# Patient Record
Sex: Female | Born: 1990 | Race: White | Hispanic: No | Marital: Married | Smoking: Former smoker
Health system: Southern US, Community
[De-identification: ages and names within clinical notes are randomized; demographics above are authoritative.]

## PROBLEM LIST (undated history)

## (undated) ENCOUNTER — Inpatient Hospital Stay (HOSPITAL_COMMUNITY): Payer: Self-pay

## (undated) DIAGNOSIS — F32A Depression, unspecified: Secondary | ICD-10-CM

## (undated) DIAGNOSIS — G971 Other reaction to spinal and lumbar puncture: Secondary | ICD-10-CM

## (undated) DIAGNOSIS — Z950 Presence of cardiac pacemaker: Secondary | ICD-10-CM

## (undated) DIAGNOSIS — I471 Supraventricular tachycardia: Secondary | ICD-10-CM

## (undated) DIAGNOSIS — R531 Weakness: Secondary | ICD-10-CM

## (undated) DIAGNOSIS — J189 Pneumonia, unspecified organism: Secondary | ICD-10-CM

## (undated) DIAGNOSIS — I442 Atrioventricular block, complete: Secondary | ICD-10-CM

## (undated) DIAGNOSIS — T4145XA Adverse effect of unspecified anesthetic, initial encounter: Secondary | ICD-10-CM

## (undated) DIAGNOSIS — R001 Bradycardia, unspecified: Secondary | ICD-10-CM

## (undated) DIAGNOSIS — Z8489 Family history of other specified conditions: Secondary | ICD-10-CM

## (undated) DIAGNOSIS — R0600 Dyspnea, unspecified: Secondary | ICD-10-CM

## (undated) DIAGNOSIS — I4719 Other supraventricular tachycardia: Secondary | ICD-10-CM

## (undated) DIAGNOSIS — T8859XA Other complications of anesthesia, initial encounter: Secondary | ICD-10-CM

## (undated) DIAGNOSIS — F329 Major depressive disorder, single episode, unspecified: Secondary | ICD-10-CM

## (undated) DIAGNOSIS — F319 Bipolar disorder, unspecified: Secondary | ICD-10-CM

## (undated) DIAGNOSIS — R350 Frequency of micturition: Secondary | ICD-10-CM

## (undated) DIAGNOSIS — R351 Nocturia: Secondary | ICD-10-CM

## (undated) DIAGNOSIS — R519 Headache, unspecified: Secondary | ICD-10-CM

## (undated) DIAGNOSIS — A1801 Tuberculosis of spine: Secondary | ICD-10-CM

## (undated) DIAGNOSIS — R51 Headache: Secondary | ICD-10-CM

## (undated) HISTORY — DX: Bradycardia, unspecified: R00.1

## (undated) HISTORY — PX: PACEMAKER PLACEMENT: SHX43

## (undated) HISTORY — PX: TONSILLECTOMY: SUR1361

## (undated) HISTORY — PX: KNEE ARTHROSCOPY: SUR90

## (undated) HISTORY — DX: Bipolar disorder, unspecified: F31.9

## (undated) HISTORY — DX: Presence of cardiac pacemaker: Z95.0

---

## 2014-03-25 ENCOUNTER — Emergency Department (HOSPITAL_BASED_OUTPATIENT_CLINIC_OR_DEPARTMENT_OTHER)
Admission: EM | Admit: 2014-03-25 | Discharge: 2014-03-25 | Disposition: A | Discharge status: Home / Self Care | Payer: Medicaid Other | Attending: Emergency Medicine | Admitting: Emergency Medicine

## 2014-03-25 ENCOUNTER — Emergency Department (HOSPITAL_BASED_OUTPATIENT_CLINIC_OR_DEPARTMENT_OTHER): Payer: Medicaid Other

## 2014-03-25 ENCOUNTER — Encounter (HOSPITAL_BASED_OUTPATIENT_CLINIC_OR_DEPARTMENT_OTHER): Payer: Self-pay | Admitting: Emergency Medicine

## 2014-03-25 DIAGNOSIS — R011 Cardiac murmur, unspecified: Secondary | ICD-10-CM | POA: Insufficient documentation

## 2014-03-25 DIAGNOSIS — O209 Hemorrhage in early pregnancy, unspecified: Secondary | ICD-10-CM | POA: Insufficient documentation

## 2014-03-25 LAB — URINALYSIS, ROUTINE W REFLEX MICROSCOPIC
Bilirubin Urine: NEGATIVE
Glucose, UA: NEGATIVE mg/dL
KETONES UR: NEGATIVE mg/dL
LEUKOCYTES UA: NEGATIVE
NITRITE: NEGATIVE
PROTEIN: NEGATIVE mg/dL
Specific Gravity, Urine: 1.004 — ABNORMAL LOW (ref 1.005–1.030)
Urobilinogen, UA: 0.2 mg/dL (ref 0.0–1.0)
pH: 6.5 (ref 5.0–8.0)

## 2014-03-25 LAB — WET PREP, GENITAL
Trich, Wet Prep: NONE SEEN
Yeast Wet Prep HPF POC: NONE SEEN

## 2014-03-25 LAB — URINE MICROSCOPIC-ADD ON

## 2014-03-25 LAB — ABO/RH: ABO/RH(D): B NEG

## 2014-03-25 LAB — RH IG WORKUP (INCLUDES ABO/RH)
ABO/RH(D): B NEG
Antibody Screen: NEGATIVE

## 2014-03-25 LAB — HCG, QUANTITATIVE, PREGNANCY: HCG, BETA CHAIN, QUANT, S: 701 m[IU]/mL — AB (ref <5)

## 2014-03-25 LAB — PREGNANCY, URINE: PREG TEST UR: POSITIVE — AB

## 2014-03-25 MED ORDER — RHO D IMMUNE GLOBULIN 1500 UNIT/2ML IJ SOSY
300.0000 ug | PREFILLED_SYRINGE | Freq: Once | INTRAMUSCULAR | Status: AC
  Administered 2014-03-25: 300 ug via INTRAMUSCULAR

## 2014-03-25 MED ORDER — METRONIDAZOLE 500 MG PO TABS: 500.0000 mg | ORAL_TABLET | Freq: Two times a day (BID) | ORAL | Status: DC

## 2014-03-25 MED ORDER — RHO D IMMUNE GLOBULIN 1500 UNIT/2ML IJ SOSY
PREFILLED_SYRINGE | INTRAMUSCULAR | Status: AC
  Administered 2014-03-25: 300 ug via INTRAMUSCULAR
  Filled 2014-03-25: qty 2

## 2014-03-25 NOTE — ED Notes (Addendum)
Pt reports vaginal cramping and then large clot that she passed 1 hour PTA.   Pt reports that she is [redacted] weeks pregnant-denies prenatal care at this time.

## 2014-03-25 NOTE — ED Notes (Signed)
MD at bedside. 

## 2014-03-25 NOTE — Discharge Instructions (Signed)
You had bleeding in your first trimester and your pelvic ultrasound showed no fetal heart tone or definite intrauterine pregnancy.You had a positive pregnancy test. This could be a miscarriage, an abnormal intrauterine or ectopic pregnancy, or a very early pregnancy. - Go to [facility] in 2-3 days for a repeat beta HCG blood test and check. - Have a repeat pelvic ultrasound (transvaginal and abdominal) done in 10-14 days. - If you develop severe pain, fever, or other concerns, seek immediate care.  You also had bacterial vaginosis. - Take metronidazole twice daily for 7 days.   Vaginal Bleeding During Pregnancy A small amount of bleeding from the vagina can happen anytime during pregnancy. It usually stops on its own. However, some bleeding can be serious. Be sure to tell your doctor about all vaginal bleeding. HOME CARE  Get plenty of rest and sleep.  Stay in bed and only get up to go to the bathroom as told by your doctor.  Write down the number of pads you use each day. Note how soaked they are.  Do not use tampons. Do not clean the vagina with a stream of water (douche).  Do not have sex (intercourse) or put anything into your vagina. Have this approved by your doctor.  Save any tissue that comes from your vagina. Show it to your doctor.  Only take medicine as told by your doctor.  Follow your doctor's advice about lifting, driving, and physical activity. GET HELP RIGHT AWAY IF:   You feel your baby move less or not at all.  You pass out (faint) while going to the bathroom.  You have more bleeding.  You start to have contractions.  You have severe cramps in your stomach, back, or belly (abdomen).  You are leaking fluid or have a gush of fluid from your vagina.  You become lightheaded or weak.  You have chills.  You have clumps of tissue or blood clots coming from your vagina.  You have a fever. MAKE SURE YOU:   Understand these instructions.  Will watch  your condition.  Will get help right away if you are not doing well or get worse. Document Released: 08/06/2008 Document Revised: 10/14/2012 Document Reviewed: 08/17/2012 [facility] Patient Information 2014 [place], [place].

## 2014-03-25 NOTE — ED Provider Notes (Signed)
CSN: [id number]     Arrival date & time 03/25/14  1641 History   First MD Initiated Contact with Patient 03/25/14 1655     Chief Complaint  Patient presents with  . Vaginal Bleeding    Patient is a 23 y.o. female presenting with vaginal bleeding.  Vaginal Bleeding  23 y.o. female with 1 hour of abdominal cramps and blood clots. This is her first pregnancy and she is worried about pregnancy loss. She has not yet established care with PCP and thinks that by LMP, pregnancy is likely at ~6 weeks. She denies fever, chills, dysuria, or abnormal vaginal discharge. She has had nausea and vomiting for a few weeks but over the past few days these have decreased. Denies LE edema or pain, chest pain, or dyspnea. She was sexually active last night.   History reviewed. No pertinent past medical history. Reports no PMH Trying to get established with a PCP  G1P0  Past Surgical History  Procedure Laterality Date  . Tonsillectomy     History reviewed. No pertinent family history. History  Substance Use Topics  . Smoking status: Never Smoker   . Smokeless tobacco: Not on file  . Alcohol Use: No  Denies tobacco, drugs, or alcohol.   Review of Systems  Genitourinary: Positive for vaginal bleeding.  All other systems reviewed and are negative.  Allergies  Review of patient's allergies indicates no known allergies.  Home Medications   Prior to Admission medications   Not on File   BP 124/69  Pulse 84  Temp(Src) 98.6 F (37 C) (Oral)  Resp 16  Ht 5\' 2"  (1.575 m)  Wt 140 lb (63.504 kg)  BMI 25.60 kg/m2  SpO2 100%  LMP 02/09/2014 Physical Exam GEN: NAD HEENT: Atraumatic, normocephalic, neck supple, EOMI, sclera clear  CV: RRR, I-II/VI systolic murmur PULM: CTAB, normal effort ABD: Soft, nontender, nondistended, NABS, no organomegaly SKIN: No rash or cyanosis; warm and well-perfused EXTR: No lower extremity edema or calf tenderness PSYCH: Mood and affect euthymic, normal rate and  volume of speech NEURO: Awake, alert, no focal deficits grossly, normal speech GU: Pooling of blood in vaginal vault, cervix closed, no discharge seen  ED Course  Procedures (including critical care time) Labs Review Labs Reviewed  WET PREP, GENITAL - Abnormal; Notable for the following:    Clue Cells Wet Prep HPF POC MODERATE (*)    WBC, Wet Prep HPF POC FEW (*)    All other components within normal limits  PREGNANCY, URINE - Abnormal; Notable for the following:    Preg Test, Ur POSITIVE (*)    All other components within normal limits  URINALYSIS, ROUTINE W REFLEX MICROSCOPIC - Abnormal; Notable for the following:    Specific Gravity, Urine 1.004 (*)    Hgb urine dipstick LARGE (*)    All other components within normal limits  HCG, QUANTITATIVE, PREGNANCY - Abnormal; Notable for the following:    hCG, Beta Chain, Quant, S 701 (*)    All other components within normal limits  GC/CHLAMYDIA PROBE AMP  URINE MICROSCOPIC-ADD ON  ABO/RH  RH IG WORKUP (INCLUDES ABO/RH)    Imaging Review US Ob Comp Less 14 Wks  03/25/2014   CLINICAL DATA:  Pregnant patient passing large clots.  EXAM: OBSTETRIC <14 WK [place] AND TRANSVAGINAL OB US  TECHNIQUE: Both transabdominal and transvaginal ultrasound examinations were performed for complete evaluation of the gestation as well as the maternal uterus, adnexal regions, and pelvic cul-de-sac. Transvaginal technique was performed to  assess early pregnancy.  COMPARISON:  None.  FINDINGS: Intrauterine gestational sac: Not visualized  Yolk sac:  Not visualized  Embryo:  Not visualized  Cardiac Activity: Not present  Maternal uterus/adnexae: The endometrium is mildly heterogeneous and measures up to 8 mm. The right ovary measures 3.4 x 3.3 x 2.6 cm. The left ovary measures 4.3 x 1.4 x 1.4 cm. No significant free fluid. Emanating off the posterior aspect of the uterine body is a 3.3 x 2.5 x 3.2 cm mass, favored to represent a subserosal fibroid.  IMPRESSION: No  intrauterine pregnancy visualized. Inhomogeneous endometrium may represent blood products. In the setting of positive pregnancy test and no definite intrauterine pregnancy, this reflects a pregnancy of unknown location. Differential considerations include early normal IUP, abnormal IUP, or nonvisualized ectopic pregnancy. Differentiation is achieved with serial beta HCG supplemented by repeat sonography. Recommend follow-up ultrasound in 10-14 days.   Electronically Signed   By: Lovey Newcomer M.D.   On: 03/25/2014 18:11   US Ob Transvaginal  03/25/2014   CLINICAL DATA:  Pregnant patient passing large clots.  EXAM: OBSTETRIC <14 WK [place] AND TRANSVAGINAL OB US  TECHNIQUE: Both transabdominal and transvaginal ultrasound examinations were performed for complete evaluation of the gestation as well as the maternal uterus, adnexal regions, and pelvic cul-de-sac. Transvaginal technique was performed to assess early pregnancy.  COMPARISON:  None.  FINDINGS: Intrauterine gestational sac: Not visualized  Yolk sac:  Not visualized  Embryo:  Not visualized  Cardiac Activity: Not present  Maternal uterus/adnexae: The endometrium is mildly heterogeneous and measures up to 8 mm. The right ovary measures 3.4 x 3.3 x 2.6 cm. The left ovary measures 4.3 x 1.4 x 1.4 cm. No significant free fluid. Emanating off the posterior aspect of the uterine body is a 3.3 x 2.5 x 3.2 cm mass, favored to represent a subserosal fibroid.  IMPRESSION: No intrauterine pregnancy visualized. Inhomogeneous endometrium may represent blood products. In the setting of positive pregnancy test and no definite intrauterine pregnancy, this reflects a pregnancy of unknown location. Differential considerations include early normal IUP, abnormal IUP, or nonvisualized ectopic pregnancy. Differentiation is achieved with serial beta HCG supplemented by repeat sonography. Recommend follow-up ultrasound in 10-14 days.   Electronically Signed   By: Lovey Newcomer M.D.   On:  03/25/2014 18:11     EKG Interpretation None      MDM   Final diagnoses:  First trimester bleeding   23 y.o. G1P0 at 6 weeks by LMP with abdominal cramping and passing bloody clots 1-2 hours prior to arrival. UPT positive. DDx includes cervical/vaginal/uterine abnormality, ectopic, miscarriage, or implanting of pregnancy. Denies fevers/chills or abnormal discharge. Reports recent sexual intercourse. Abdomen nonacute. Pooling of blood on speculum exam. Transvaginal US with no fetal heart tone and no visualized IUP. Likely miscarriage, but also possibly earlier gestation than thought or non-intrauterine pregnancy.  - Wet prep with clue cells - willt reat with 7 days flagyl. - GC/chlamydia sent.  - UA - large hgb - ABO/Rh - B negative. Dosed rhogam. - hCG 701, lower than would expect for viable 6 week IUP. - Pt instructed to repeat beta-hCG in 2-3 days and have recheck at [facility], and repeat transvaginal US in 10-14 days. - Return precautions reviewed in detail. - Pt voiced understanding.    Hilton Sinclair, MD PGY-2, [place], MD 03/25/14 2033

## 2014-03-26 ENCOUNTER — Inpatient Hospital Stay (HOSPITAL_COMMUNITY)
Admission: AD | Admit: 2014-03-26 | Discharge: 2014-03-26 | Disposition: A | Discharge status: Home / Self Care | Payer: Self-pay | Source: Ambulatory Visit | Attending: Obstetrics & Gynecology | Admitting: Obstetrics & Gynecology

## 2014-03-26 ENCOUNTER — Encounter (HOSPITAL_COMMUNITY): Payer: Self-pay | Admitting: *Deleted

## 2014-03-26 DIAGNOSIS — O200 Threatened abortion: Secondary | ICD-10-CM | POA: Insufficient documentation

## 2014-03-26 DIAGNOSIS — R109 Unspecified abdominal pain: Secondary | ICD-10-CM | POA: Insufficient documentation

## 2014-03-26 DIAGNOSIS — O4690 Antepartum hemorrhage, unspecified, unspecified trimester: Secondary | ICD-10-CM

## 2014-03-26 LAB — CBC
HEMATOCRIT: 40.5 % (ref 36.0–46.0)
Hemoglobin: 13.8 g/dL (ref 12.0–15.0)
MCH: 30.2 pg (ref 26.0–34.0)
MCHC: 34.1 g/dL (ref 30.0–36.0)
MCV: 88.6 fL (ref 78.0–100.0)
Platelets: 292 10*3/uL (ref 150–400)
RBC: 4.57 MIL/uL (ref 3.87–5.11)
RDW: 12.9 % (ref 11.5–15.5)
WBC: 7.7 10*3/uL (ref 4.0–10.5)

## 2014-03-26 LAB — HCG, QUANTITATIVE, PREGNANCY: hCG, Beta Chain, Quant, S: 303 m[IU]/mL — ABNORMAL HIGH (ref <5)

## 2014-03-26 MED ORDER — PROMETHAZINE HCL 25 MG PO TABS: 12.5000 mg | ORAL_TABLET | Freq: Four times a day (QID) | ORAL | Status: DC | PRN

## 2014-03-26 MED ORDER — PROMETHAZINE HCL 25 MG PO TABS
25.0000 mg | ORAL_TABLET | Freq: Once | ORAL | Status: AC
  Administered 2014-03-26: 25 mg via ORAL
  Filled 2014-03-26: qty 1

## 2014-03-26 MED ORDER — HYDROCODONE-ACETAMINOPHEN 5-325 MG PO TABS
1.0000 | ORAL_TABLET | Freq: Once | ORAL | Status: AC
  Administered 2014-03-26: 1 via ORAL
  Filled 2014-03-26: qty 1

## 2014-03-26 MED ORDER — HYDROCODONE-ACETAMINOPHEN 5-325 MG PO TABS: 1.0000 | ORAL_TABLET | ORAL | Status: DC | PRN

## 2014-03-26 NOTE — Discharge Instructions (Signed)
Pelvic Rest °Pelvic rest is sometimes recommended for women when:  °· The placenta is partially or completely covering the opening of the cervix (placenta previa). °· There is bleeding between the uterine wall and the amniotic sac in the first trimester (subchorionic hemorrhage). °· The cervix begins to open without labor starting (incompetent cervix, cervical insufficiency). °· The labor is too early (preterm labor). °HOME CARE INSTRUCTIONS °· Do not have sexual intercourse, stimulation, or an orgasm. °· Do not use tampons, douche, or put anything in the vagina. °· Do not lift anything over 10 pounds (4.5 kg). °· Avoid strenuous activity or straining your pelvic muscles. °SEEK MEDICAL CARE IF:  °· You have any vaginal bleeding during pregnancy. Treat this as a potential emergency. °· You have cramping pain felt low in the stomach (stronger than menstrual cramps). °· You notice vaginal discharge (watery, mucus, or bloody). °· You have a low, dull backache. °· There are regular contractions or uterine tightening. °SEEK IMMEDIATE MEDICAL CARE IF: °You have vaginal bleeding and have placenta previa.  °Document Released: 02/22/2011 Document Revised: 01/20/2012 Document Reviewed: 02/22/2011 °Exit[employer]® Patient Information ©2014 [employer]. ° °Threatened Miscarriage ° A threatened miscarriage is a pregnancy that may end. It may be marked by bleeding during the first 20 weeks of pregnancy. Often, the pregnancy can continue without any more problems. You may be asked to stop: °· Having sex (intercourse). °· Having orgasms. °· Using tampons. °· Exercising. °· Doing heavy physical activity and work. °HOME CARE  °· Your doctor may tell you to take bed rest and to stop activities and work. °· Write down the number of pads you use each day. Write down how often you change pads. Write down how soaked they are. °· Follow your doctor's advice for follow-up visits and tests. °· If your blood type is Rh-negative and the father's  blood is Rh-positive (or is not known), you may get a shot to protect the baby. °· If you have a miscarriage, save all the tissue you pass in a container. Take the container to your doctor. °GET HELP RIGHT AWAY IF:  °· You have bad cramps or pain in your belly (abdomen), lower belly, or back. °· You have a fever or chills. °· Your bleeding gets worse or you pass large clots of blood or tissue. Save this tissue to show your doctor. °· You feel lightheaded, weak, dizzy, or pass out (faint). °· You have a gush of fluid from your vagina. °MAKE SURE YOU:  °· Understand these instructions. °· Will watch your condition. °· Will get help right away if you are not doing well or get worse. °Document Released: 10/10/2008 Document Revised: 01/20/2012 Document Reviewed: 11/13/2009 °ExitCare® Patient Information ©2014 [employer]. ° °

## 2014-03-26 NOTE — MAU Provider Note (Signed)
History     CSN: [id number]  Arrival date and time: 03/26/14 1135   First Provider Initiated Contact with Patient 03/26/14 1205      Chief Complaint  Patient presents with  . Vaginal Bleeding  . Abdominal Cramping   HPI  Laurie Phillips is a 23 y.o. female G1P0 at [redacted]w[redacted]d who presents with vaginal bleeding and abdominal pain. The patient was seen yesterday at urgent care and sent home with ectopic precautions vs. threatend miscarriage. The patient has continued to have bleeding and have pain throughout the night; the patients husband brought her in today because he is concerned with how much abdominal cramping she is having. The patient feels she passed a large blood clot that appeared to be "suspicious". The pain is located in her lower abdomen; both sides, at times worse on her right side. She was sent home yesterday with instructions to follow up at [facility] hospital in 2-3 days for a beta hcg level. The plan also included a follow up US in 10-14 days if needed.   OB History   Grav Para Term Preterm Abortions TAB SAB Ect Mult Living   1               History reviewed. No pertinent past medical history.  Past Surgical History  Procedure Laterality Date  . Tonsillectomy      History reviewed. No pertinent family history.  History  Substance Use Topics  . Smoking status: Never Smoker   . Smokeless tobacco: Never Used  . Alcohol Use: No    Allergies: No Known Allergies  Prescriptions prior to admission  Medication Sig Dispense Refill  . metroNIDAZOLE (FLAGYL) 500 MG tablet Take 1 tablet (500 mg total) by mouth 2 (two) times daily.  14 tablet  0   Results for orders placed during the hospital encounter of 03/26/14 (from the past 48 hour(s))  HCG, QUANTITATIVE, PREGNANCY     Status: Abnormal   Collection Time    03/26/14 12:07 PM      Result Value Ref Range   hCG, Beta Chain, Quant, S 303 (*) <5 mIU/mL   Comment:              GEST. AGE      CONC.  (mIU/mL)       <=1  WEEK        5 - 50         2 WEEKS       50 - 500         3 WEEKS       100 - 10,000         4 WEEKS     1,000 - 30,000         5 WEEKS     3,500 - 115,000       6-8 WEEKS     12,000 - 270,000        12 WEEKS     15,000 - 220,000                FEMALE AND NON-PREGNANT FEMALE:         LESS THAN 5 mIU/mL  CBC     Status: None   Collection Time    03/26/14 12:07 PM      Result Value Ref Range   WBC 7.7  4.0 - 10.5 K/uL   RBC 4.57  3.87 - 5.11 MIL/uL   Hemoglobin 13.8  12.0 - 15.0 g/dL   HCT  40.5  36.0 - 46.0 %   MCV 88.6  78.0 - 100.0 fL   MCH 30.2  26.0 - 34.0 pg   MCHC 34.1  30.0 - 36.0 g/dL   RDW 12.9  11.5 - 15.5 %   Platelets 292  150 - 400 K/uL   US Ob Comp Less 14 Wks  03/25/2014   CLINICAL DATA:  Pregnant patient passing large clots.  EXAM: OBSTETRIC <14 WK [place] AND TRANSVAGINAL OB US  TECHNIQUE: Both transabdominal and transvaginal ultrasound examinations were performed for complete evaluation of the gestation as well as the maternal uterus, adnexal regions, and pelvic cul-de-sac. Transvaginal technique was performed to assess early pregnancy.  COMPARISON:  None.  FINDINGS: Intrauterine gestational sac: Not visualized  Yolk sac:  Not visualized  Embryo:  Not visualized  Cardiac Activity: Not present  Maternal uterus/adnexae: The endometrium is mildly heterogeneous and measures up to 8 mm. The right ovary measures 3.4 x 3.3 x 2.6 cm. The left ovary measures 4.3 x 1.4 x 1.4 cm. No significant free fluid. Emanating off the posterior aspect of the uterine body is a 3.3 x 2.5 x 3.2 cm mass, favored to represent a subserosal fibroid.  IMPRESSION: No intrauterine pregnancy visualized. Inhomogeneous endometrium may represent blood products. In the setting of positive pregnancy test and no definite intrauterine pregnancy, this reflects a pregnancy of unknown location. Differential considerations include early normal IUP, abnormal IUP, or nonvisualized ectopic pregnancy. Differentiation is achieved  with serial beta HCG supplemented by repeat sonography. Recommend follow-up ultrasound in 10-14 days.   Electronically Signed   By: Lovey Newcomer M.D.   On: 03/25/2014 18:11   US Ob Transvaginal  03/25/2014   CLINICAL DATA:  Pregnant patient passing large clots.  EXAM: OBSTETRIC <14 WK [place] AND TRANSVAGINAL OB US  TECHNIQUE: Both transabdominal and transvaginal ultrasound examinations were performed for complete evaluation of the gestation as well as the maternal uterus, adnexal regions, and pelvic cul-de-sac. Transvaginal technique was performed to assess early pregnancy.  COMPARISON:  None.  FINDINGS: Intrauterine gestational sac: Not visualized  Yolk sac:  Not visualized  Embryo:  Not visualized  Cardiac Activity: Not present  Maternal uterus/adnexae: The endometrium is mildly heterogeneous and measures up to 8 mm. The right ovary measures 3.4 x 3.3 x 2.6 cm. The left ovary measures 4.3 x 1.4 x 1.4 cm. No significant free fluid. Emanating off the posterior aspect of the uterine body is a 3.3 x 2.5 x 3.2 cm mass, favored to represent a subserosal fibroid.  IMPRESSION: No intrauterine pregnancy visualized. Inhomogeneous endometrium may represent blood products. In the setting of positive pregnancy test and no definite intrauterine pregnancy, this reflects a pregnancy of unknown location. Differential considerations include early normal IUP, abnormal IUP, or nonvisualized ectopic pregnancy. Differentiation is achieved with serial beta HCG supplemented by repeat sonography. Recommend follow-up ultrasound in 10-14 days.   Electronically Signed   By: Lovey Newcomer M.D.   On: 03/25/2014 18:11    Review of Systems  Constitutional: Negative for fever and chills.  Respiratory: Negative for shortness of breath.   Gastrointestinal: Positive for nausea and abdominal pain. Negative for vomiting, diarrhea and constipation.  Genitourinary: Negative for dysuria, urgency, frequency and hematuria.       No vaginal  discharge. No vaginal bleeding; none currently.  No dysuria.   Neurological: Negative for dizziness.   Physical Exam   Blood pressure 94/50, pulse 56, resp. rate 18, height 5\' 2"  (1.575 m), weight 67.302 kg (148  lb 6 oz), last menstrual period 02/09/2014.  Physical Exam  Constitutional: She is oriented to person, place, and time. She appears well-developed and well-nourished. No distress.  HENT:  Head: Normocephalic.  Eyes: Pupils are equal, round, and reactive to light.  Neck: Neck supple.  Respiratory: Effort normal.  GI: Soft. Normal appearance. There is tenderness in the right lower quadrant and left lower quadrant. There is rigidity. There is no rebound and no guarding.  Genitourinary:  Bimanual exam: Cervix FT Scant amount of blood noted on glove.  Uterus non tender, enlarged  Adnexa non tender Chaperone present for exam.   Musculoskeletal: Normal range of motion.  Neurological: She is alert and oriented to person, place, and time.  Skin: Skin is warm. She is not diaphoretic. There is pallor.  Psychiatric: Her behavior is normal.    MAU Course  Procedures None  MDM Beta HCG level 5/15: 701- done outside of [facility]  Beta HCG level 5/16: 303 B negative blood type: pt received rhogam on 5/15.  CBC Beta hcg level  Vicodin for pain; 1 tab Phenergan 25 mg tab  Pt denies pain at the time of discharge.   Assessment and Plan   A:  Vaginal bleeding in pregnancy Threatened miscarriage  Abdominal pain in pregnancy; cannot exclude ectopic pregnancy   P:  Discharge home in stable condition RX: Phenergan         Vicodin  Pt to follow up in the clinic for repeat beta hcg level between 8:00-11:00 on 5/18 Bleeding precautions discussed Pelvic rest Return to MAU if pain or bleeding worsens.   Darrelyn Hillock Kyleah P[place]abene, NP  03/26/2014, 2:11 PM

## 2014-03-26 NOTE — MAU Note (Signed)
Pt states was seen at [facility] last pm and had u/s, bloodwork, etc for eval of early pregnancy and cramping/bleeding. Went home and pain became worse. Was up all night with pain, however pain has improved slightly at present.

## 2014-03-27 NOTE — ED Provider Notes (Signed)
I saw and evaluated the patient, reviewed the resident's note and I agree with the findings and plan.   EKG Interpretation None      23 year old female with abdominal cramping, positive pregnancy test, vaginal bleeding. Symptoms improving and ultrasound shows no intrauterine pregnancy. On exam, well-appearing, nontoxic, slightly anxious, normal respiratory effort normal perfusion, abdomen soft with no significant tenderness. Plan DC with close follow up for repeat beta-hCG and ultrasound.    Clinical Impression: 1. First trimester bleeding       Houston Siren III, MD 03/27/14 [phone]

## 2014-03-28 ENCOUNTER — Other Ambulatory Visit: Payer: Medicaid Other

## 2014-03-28 ENCOUNTER — Telehealth: Payer: Self-pay

## 2014-03-28 DIAGNOSIS — O039 Complete or unspecified spontaneous abortion without complication: Secondary | ICD-10-CM

## 2014-03-28 LAB — GC/CHLAMYDIA PROBE AMP
CT Probe RNA: NEGATIVE
GC PROBE AMP APTIMA: NEGATIVE

## 2014-03-28 LAB — HCG, QUANTITATIVE, PREGNANCY: hCG, Beta Chain, Quant, S: 104.2 m[IU]/mL

## 2014-03-28 NOTE — Telephone Encounter (Addendum)
Consulted Dr. Kennon Rounds regarding pt.'s STAT beta quant level-- 104.2. Dr. Kennon Rounds reccomends pt. Return in 1-2 weeks for f/u SAB with provider. No need to repeat lab.Called pt. And informed her of results, recommendations and that our front office staff will call her with a f/u appointment (to occur in 1-2 weeks). Pt. Verbalized understanding. No questions or concerns.

## 2014-04-13 ENCOUNTER — Telehealth: Payer: Self-pay | Admitting: *Deleted

## 2014-04-13 ENCOUNTER — Encounter: Payer: Self-pay | Admitting: Obstetrics & Gynecology

## 2014-04-13 NOTE — Telephone Encounter (Signed)
Patient missed appointment today. I called her and she stated that she knew about the appointment but didn't want to miss work. I asked if we can reschedule her and she stated not at this time. I advised that it is recommended. Patient states that she will call back if she decides to reschedule.

## 2014-08-06 ENCOUNTER — Inpatient Hospital Stay (HOSPITAL_COMMUNITY): Payer: Medicaid Other

## 2014-08-06 ENCOUNTER — Encounter (HOSPITAL_COMMUNITY): Payer: Self-pay | Admitting: *Deleted

## 2014-08-06 ENCOUNTER — Inpatient Hospital Stay (HOSPITAL_COMMUNITY)
Admission: AD | Admit: 2014-08-06 | Discharge: 2014-08-06 | Disposition: A | Discharge status: Home / Self Care | Payer: Self-pay | Source: Ambulatory Visit | Attending: Obstetrics & Gynecology | Admitting: Obstetrics & Gynecology

## 2014-08-06 DIAGNOSIS — O200 Threatened abortion: Secondary | ICD-10-CM | POA: Insufficient documentation

## 2014-08-06 DIAGNOSIS — O209 Hemorrhage in early pregnancy, unspecified: Secondary | ICD-10-CM

## 2014-08-06 LAB — CBC
HEMATOCRIT: 39.3 % (ref 36.0–46.0)
HEMOGLOBIN: 14.2 g/dL (ref 12.0–15.0)
MCH: 30.7 pg (ref 26.0–34.0)
MCHC: 36.1 g/dL — ABNORMAL HIGH (ref 30.0–36.0)
MCV: 85.1 fL (ref 78.0–100.0)
Platelets: 325 10*3/uL (ref 150–400)
RBC: 4.62 MIL/uL (ref 3.87–5.11)
RDW: 12.3 % (ref 11.5–15.5)
WBC: 12 10*3/uL — AB (ref 4.0–10.5)

## 2014-08-06 LAB — WET PREP, GENITAL
Clue Cells Wet Prep HPF POC: NONE SEEN
Trich, Wet Prep: NONE SEEN
Yeast Wet Prep HPF POC: NONE SEEN

## 2014-08-06 LAB — URINALYSIS, ROUTINE W REFLEX MICROSCOPIC
Bilirubin Urine: NEGATIVE
GLUCOSE, UA: NEGATIVE mg/dL
Hgb urine dipstick: NEGATIVE
KETONES UR: 40 mg/dL — AB
LEUKOCYTES UA: NEGATIVE
Nitrite: NEGATIVE
Protein, ur: NEGATIVE mg/dL
SPECIFIC GRAVITY, URINE: 1.01 (ref 1.005–1.030)
Urobilinogen, UA: 1 mg/dL (ref 0.0–1.0)
pH: 6.5 (ref 5.0–8.0)

## 2014-08-06 LAB — HCG, QUANTITATIVE, PREGNANCY: HCG, BETA CHAIN, QUANT, S: 8634 m[IU]/mL — AB (ref <5)

## 2014-08-06 LAB — HIV ANTIBODY (ROUTINE TESTING W REFLEX): HIV 1&2 Ab, 4th Generation: NONREACTIVE

## 2014-08-06 LAB — POCT PREGNANCY, URINE: Preg Test, Ur: POSITIVE — AB

## 2014-08-06 NOTE — MAU Provider Note (Signed)
  History     CSN: [id number]  Arrival date and time: 08/06/14 1600   First Provider Initiated Contact with Patient 08/06/14 1641      Chief Complaint  Patient presents with  . Threatened Miscarriage   HPI Laurie Phillips 23 y.o. G3P0010 @[redacted]w[redacted]d  presents to MAU with pain, vaginal spotting and cramping.  Yesterday she first noticed cramps that would not go away.  She notices the pain in the bilat lower abdomen that is anywhere from 4-8/10.  She first noticed brown bleeding with wiping yesterday.  Today she has noticed it to be red and still, only when she wipes.  She last had sex 3 days ago.   She denies nausea, vomiting, dysuria.    OB History   Grav Para Term Preterm Abortions TAB SAB Ect Mult Living   3 0 0 0 1 0 1 0 0 0       History reviewed. No pertinent past medical history.  Past Surgical History  Procedure Laterality Date  . Tonsillectomy      No family history on file.  History  Substance Use Topics  . Smoking status: Never Smoker   . Smokeless tobacco: Never Used  . Alcohol Use: No    Allergies: No Known Allergies  Prescriptions prior to admission  Medication Sig Dispense Refill  . FOLIC ACID PO Take 1 each by mouth daily.        Review of Systems  Constitutional: Positive for diaphoresis. Negative for fever and chills.  HENT: Negative for congestion and sore throat.   Eyes: Negative for blurred vision and double vision.  Respiratory: Positive for shortness of breath. Negative for cough and wheezing.   Cardiovascular: Negative for chest pain and palpitations.  Gastrointestinal: Positive for nausea and abdominal pain. Negative for heartburn, vomiting, diarrhea and constipation.  Genitourinary: Positive for frequency. Negative for dysuria.  Musculoskeletal: Negative for back pain and neck pain.  Skin: Negative for itching and rash.  Neurological: Positive for dizziness and headaches. Negative for tingling and weakness.  Psychiatric/Behavioral: Negative for  depression, suicidal ideas and substance abuse. The patient is nervous/anxious.    Physical Exam   Height 5\' 2"  (1.575 m), weight 139 lb 8 oz (63.277 kg), last menstrual period 06/28/2014, not currently breastfeeding.  Physical Exam  Constitutional: She appears well-developed and well-nourished.  HENT:  Head: Normocephalic.  Eyes: EOM are normal.  Neck: Normal range of motion.  Cardiovascular: Normal rate and regular rhythm.   Respiratory: Effort normal and breath sounds normal. No respiratory distress.  GI: Soft. Bowel sounds are normal. She exhibits no distension.  Mild tenderness across lower abdomen: RLQ and LLQ  Genitourinary:  Moderate amt of tan colored discharge in vaginal vault. No frank blood seen.   No CMT, no adnexal tenderness or mass appreciated  Musculoskeletal: Normal range of motion.    MAU Course  Procedures  MDM Discussed with Dr. Ihor Dow.  Risk of ectopic is low however advised for pt to return to MAU in 48 hours for follow up quant.    Assessment and Plan  A: Vaginal bleeding in first trimester pregnancy, cannot entirely exclude interstitial ectopic pregnancy at this time  P: Discharge to home Return to MAU in 48 hours for follow up Quant HCG Ectopic precautions: return to MAU for increased pain/bleeding/etc. Pelvic rest Obtain [facility] asap  Paticia Stack 08/06/2014, 4:43 PM

## 2014-08-06 NOTE — MAU Note (Signed)
Pt states had ectopic pregnancy in May. Here today with cramping and bleeding that was worse today.

## 2014-08-06 NOTE — MAU Provider Note (Signed)
Attestation of Attending Supervision of Advanced Practitioner (CNM/NP): Evaluation and management procedures were performed by the Advanced Practitioner under my supervision and collaboration.  I have reviewed the Advanced Practitioner's note and chart, and I agree with the management and plan.  HARRAWAY-SMITH, Zenora Karpel 9:10 PM

## 2014-08-06 NOTE — Discharge Instructions (Signed)
Return to MAU in 48 hours for follow up bloodwork Obtain prenatal care asap  Pelvic Rest Pelvic rest is sometimes recommended for women when:   The placenta is partially or completely covering the opening of the cervix (placenta previa).  There is bleeding between the uterine wall and the amniotic sac in the first trimester (subchorionic hemorrhage).  The cervix begins to open without labor starting (incompetent cervix, cervical insufficiency).  The labor is too early (preterm labor). HOME CARE INSTRUCTIONS  Do not have sexual intercourse, stimulation, or an orgasm.  Do not use tampons, douche, or put anything in the vagina.  Do not lift anything over 10 pounds (4.5 kg).  Avoid strenuous activity or straining your pelvic muscles. SEEK MEDICAL CARE IF:  You have any vaginal bleeding during pregnancy. Treat this as a potential emergency.  You have cramping pain felt low in the stomach (stronger than menstrual cramps).  You notice vaginal discharge (watery, mucus, or bloody).  You have a low, dull backache.  There are regular contractions or uterine tightening. SEEK IMMEDIATE MEDICAL CARE IF: You have vaginal bleeding and have placenta previa.  Document Released: 02/22/2011 Document Revised: 01/20/2012 Document Reviewed: 02/22/2011 [facility] Patient Information 2015 [place], [place]. This information is not intended to replace advice given to you by your health care provider. Make sure you discuss any questions you have with your health care provider.  Vaginal Bleeding During Pregnancy, First Trimester A small amount of bleeding (spotting) from the vagina is common in early pregnancy. Sometimes the bleeding is normal and is not a problem, and sometimes it is a sign of something serious. Be sure to tell your doctor about any bleeding from your vagina right away. HOME CARE  Watch your condition for any changes.  Follow your doctor's instructions about how active you can  be.  If you are on bed rest:  You may need to stay in bed and only get up to use the bathroom.  You may be allowed to do some activities.  If you need help, make plans for someone to help you.  Write down:  The number of pads you use each day.  How often you change pads.  How soaked (saturated) your pads are.  Do not use tampons.  Do not douche.  Do not have sex or orgasms until your doctor says it is okay.  If you pass any tissue from your vagina, save the tissue so you can show it to your doctor.  Only take medicines as told by your doctor.  Do not take aspirin because it can make you bleed.  Keep all follow-up visits as told by your doctor. GET HELP IF:   You bleed from your vagina.  You have cramps.  You have labor pains.  You have a fever that does not go away after you take medicine. GET HELP RIGHT AWAY IF:   You have very bad cramps in your back or belly (abdomen).  You pass large clots or tissue from your vagina.  You bleed more.  You feel light-headed or weak.  You pass out (faint).  You have chills.  You are leaking fluid or have a gush of fluid from your vagina.  You pass out while pooping (having a bowel movement). MAKE SURE YOU:  Understand these instructions.  Will watch your condition.  Will get help right away if you are not doing well or get worse. Document Released: 03/14/2014 Document Reviewed: 07/05/2013 [facility] Patient Information 2015 [place]. This information  is not intended to replace advice given to you by your health care provider. Make sure you discuss any questions you have with your health care provider.

## 2014-08-06 NOTE — MAU Note (Signed)
Had ectopic pregnancy in April 2015.   Noticed cramping yesterday and had some dark brown spotting with wiping.  Today noticed cramping that were a little increased and bleeding more of a redder color.  Did not take any medication for cramping.

## 2014-08-08 ENCOUNTER — Inpatient Hospital Stay (HOSPITAL_COMMUNITY)
Admission: AD | Admit: 2014-08-08 | Discharge: 2014-08-08 | Disposition: A | Discharge status: Home / Self Care | Payer: Self-pay | Source: Ambulatory Visit | Attending: Family Medicine | Admitting: Family Medicine

## 2014-08-08 DIAGNOSIS — O0281 Inappropriate change in quantitative human chorionic gonadotropin (hCG) in early pregnancy: Secondary | ICD-10-CM | POA: Insufficient documentation

## 2014-08-08 DIAGNOSIS — O200 Threatened abortion: Secondary | ICD-10-CM

## 2014-08-08 LAB — HCG, QUANTITATIVE, PREGNANCY: HCG, BETA CHAIN, QUANT, S: 14444 m[IU]/mL — AB (ref <5)

## 2014-08-08 LAB — GC/CHLAMYDIA PROBE AMP
CT Probe RNA: NEGATIVE
GC Probe RNA: NEGATIVE

## 2014-08-08 NOTE — MAU Provider Note (Signed)
History   None      Chief Complaint:  Follow-up   Laurie Phillips is  23 y.o. G3P0010 Patient's last menstrual period was 06/28/2014.[name] Patient is here for follow up of quantitative HCG and ongoing surveillance of pregnancy status.   She is [redacted]w[redacted]d weeks gestation  by early ultrasound.  Her [place] was low risk for ectopic (see below), but recommended f/u hcg just to be sure.  Since her last visit, the patient is without new complaint.   The patient reports bleeding as  none now.  She feels fine/  General ROS:  negative for pain, bleeding  Her previous Quantitative HCG values are:  Lab Results  Component Value Date   HCGBETAQNT 14444* 08/08/2014   HCGBETAQNT 8634* 08/06/2014   HCGBETAQNT 104.2 03/28/2014    Physical Exam   Last menstrual period 06/28/2014.   Labs: Results for orders placed during the hospital encounter of 08/08/14 (from the past 24 hour(s))  HCG, QUANTITATIVE, PREGNANCY   Collection Time    08/08/14  5:22 PM      Result Value Ref Range   hCG, Beta Chain, Quant, S 14444 (*) <5 mIU/mL    Ultrasound Studies:   US Ob Comp Less 14 Wks  08/06/2014   CLINICAL DATA:  Cramping and spotting since 09/25. Quantitative beta HCG is 8,634. Gravida 2 para 0. LMP 06/28/2014. Patient is 5 weeks 4 days with EDC of 04/03/2014.  EXAM: OBSTETRIC <14 WK [place] AND TRANSVAGINAL OB US  TECHNIQUE: Both transabdominal and transvaginal ultrasound examinations were performed for complete evaluation of the gestation as well as the maternal uterus, adnexal regions, and pelvic cul-de-sac. Transvaginal technique was performed to assess early pregnancy.  COMPARISON:  03/25/2014  FINDINGS: Intrauterine gestational sac: Visualized/normal in shape.  Yolk sac:  Present  Embryo:  Possibly present  Cardiac Activity: Not definitely seen  MSD:  11  mm   5 w   6  d  Maternal uterus/adnexae: The ovaries have a normal appearance. No subchorionic hemorrhage identified. The gestational sac is eccentrically positioned in the  uterus, in the region of the right cornua. There is myometrium around the gestational sac. No "Interstitial line sign " is identified.  Small posterior mid uterine fibroid appears pedunculated and measures 1.9 x 2.2 x 2.6 cm. A second posterior fundal fibroid is 1.0 x 0.8 x 1.0 cm. Fibroids are seen only on the transabdominal portion of the exam.  IMPRESSION: 1. Eccentrically positioned gestational sac. Pregnancy is favored to be within the uterus. However, close followup is recommended to exclude an interstitial ectopic pregnancy. 2. Small posterior fibroids. 3. Normal appearing ovaries. 4. The findings were discussed with Laurie Phillips on 08/06/2014 at 7:09 pm.   Electronically Signed   By: Shon Hale M.D.   On: 08/06/2014 19:11   US Ob Transvaginal  08/06/2014   CLINICAL DATA:  Cramping and spotting since 09/25. Quantitative beta HCG is 8,634. Gravida 2 para 0. LMP 06/28/2014. Patient is 5 weeks 4 days with EDC of 04/03/2014.  EXAM: OBSTETRIC <14 WK [place] AND TRANSVAGINAL OB US  TECHNIQUE: Both transabdominal and transvaginal ultrasound examinations were performed for complete evaluation of the gestation as well as the maternal uterus, adnexal regions, and pelvic cul-de-sac. Transvaginal technique was performed to assess early pregnancy.  COMPARISON:  03/25/2014  FINDINGS: Intrauterine gestational sac: Visualized/normal in shape.  Yolk sac:  Present  Embryo:  Possibly present  Cardiac Activity: Not definitely seen  MSD:  11  mm   5 w  6  d  Maternal uterus/adnexae: The ovaries have a normal appearance. No subchorionic hemorrhage identified. The gestational sac is eccentrically positioned in the uterus, in the region of the right cornua. There is myometrium around the gestational sac. No "Interstitial line sign " is identified.  Small posterior mid uterine fibroid appears pedunculated and measures 1.9 x 2.2 x 2.6 cm. A second posterior fundal fibroid is 1.0 x 0.8 x 1.0 cm. Fibroids are seen only on the  transabdominal portion of the exam.  IMPRESSION: 1. Eccentrically positioned gestational sac. Pregnancy is favored to be within the uterus. However, close followup is recommended to exclude an interstitial ectopic pregnancy. 2. Small posterior fibroids. 3. Normal appearing ovaries. 4. The findings were discussed with Laurie Phillips on 08/06/2014 at 7:09 pm.   Electronically Signed   By: Shon Hale M.D.   On: 08/06/2014 19:11    Assessment: Appropriately rising HCG;  [redacted]w[redacted]d weeks gestation   Plan: Make appt with OB provider to start [place] 08/08/2014, 6:53 PM

## 2014-08-08 NOTE — MAU Note (Signed)
Feeling better, no bleeding or pain.

## 2014-08-08 NOTE — Discharge Instructions (Signed)
Vaginal Bleeding During Pregnancy, First Trimester  A small amount of bleeding (spotting) from the vagina is relatively common in early pregnancy. It usually stops on its own. Various things may cause bleeding or spotting in early pregnancy. Some bleeding may be related to the pregnancy, and some may not. In most cases, the bleeding is normal and is not a problem. However, bleeding can also be a sign of something serious. Be sure to tell your health care provider about any vaginal bleeding right away.  Some possible causes of vaginal bleeding during the first trimester include:  · Infection or inflammation of the cervix.  · Growths (polyps) on the cervix.  · Miscarriage or threatened miscarriage.  · Pregnancy tissue has developed outside of the uterus and in a fallopian tube (tubal pregnancy).  · Tiny cysts have developed in the uterus instead of pregnancy tissue (molar pregnancy).  HOME CARE INSTRUCTIONS   Watch your condition for any changes. The following actions may help to lessen any discomfort you are feeling:  · Follow your health care provider's instructions for limiting your activity. If your health care provider orders bed rest, you may need to stay in bed and only get up to use the bathroom. However, your health care provider may allow you to continue light activity.  · If needed, make plans for someone to help with your regular activities and responsibilities while you are on bed rest.  · Keep track of the number of pads you use each day, how often you change pads, and how soaked (saturated) they are. Write this down.  · Do not use tampons. Do not douche.  · Do not have sexual intercourse or orgasms until approved by your health care provider.  · If you pass any tissue from your vagina, save the tissue so you can show it to your health care provider.  · Only take over-the-counter or prescription medicines as directed by your health care provider.  · Do not take aspirin because it can make you  bleed.  · Keep all follow-up appointments as directed by your health care provider.  SEEK MEDICAL CARE IF:  · You have any vaginal bleeding during any part of your pregnancy.  · You have cramps or labor pains.  · You have a fever, not controlled by medicine.  SEEK IMMEDIATE MEDICAL CARE IF:   · You have severe cramps in your back or belly (abdomen).  · You pass large clots or tissue from your vagina.  · Your bleeding increases.  · You feel light-headed or weak, or you have fainting episodes.  · You have chills.  · You are leaking fluid or have a gush of fluid from your vagina.  · You pass out while having a bowel movement.  MAKE SURE YOU:  · Understand these instructions.  · Will watch your condition.  · Will get help right away if you are not doing well or get worse.  Document Released: 08/07/2005 Document Revised: 11/02/2013 Document Reviewed: 07/05/2013  [employer]® Patient Information ©2015 [employer]. This information is not intended to replace advice given to you by your health care provider. Make sure you discuss any questions you have with your health care provider.

## 2014-08-09 NOTE — MAU Provider Note (Signed)
Attestation of Attending Supervision of Advanced Practitioner (PA/CNM/NP): Evaluation and management procedures were performed by the Advanced Practitioner under my supervision and collaboration.  I have reviewed the Advanced Practitioner's note and chart, and I agree with the management and plan.  Silena Wyss, DO Attending Physician Faculty Practice, [facility]  

## 2014-09-06 ENCOUNTER — Inpatient Hospital Stay (HOSPITAL_COMMUNITY)
Admission: AD | Admit: 2014-09-06 | Discharge: 2014-09-06 | Disposition: A | Discharge status: Home / Self Care | Payer: Medicaid Other | Source: Ambulatory Visit | Attending: Family Medicine | Admitting: Family Medicine

## 2014-09-06 ENCOUNTER — Encounter (HOSPITAL_COMMUNITY): Payer: Self-pay | Admitting: *Deleted

## 2014-09-06 DIAGNOSIS — Z3A10 10 weeks gestation of pregnancy: Secondary | ICD-10-CM | POA: Insufficient documentation

## 2014-09-06 DIAGNOSIS — O26899 Other specified pregnancy related conditions, unspecified trimester: Secondary | ICD-10-CM

## 2014-09-06 DIAGNOSIS — R109 Unspecified abdominal pain: Secondary | ICD-10-CM | POA: Diagnosis present

## 2014-09-06 DIAGNOSIS — O9989 Other specified diseases and conditions complicating pregnancy, childbirth and the puerperium: Secondary | ICD-10-CM | POA: Insufficient documentation

## 2014-09-06 HISTORY — DX: Headache, unspecified: R51.9

## 2014-09-06 HISTORY — DX: Headache: R51

## 2014-09-06 LAB — URINALYSIS, ROUTINE W REFLEX MICROSCOPIC
Bilirubin Urine: NEGATIVE
Glucose, UA: NEGATIVE mg/dL
Hgb urine dipstick: NEGATIVE
Ketones, ur: NEGATIVE mg/dL
LEUKOCYTES UA: NEGATIVE
NITRITE: NEGATIVE
Protein, ur: NEGATIVE mg/dL
SPECIFIC GRAVITY, URINE: 1.025 (ref 1.005–1.030)
Urobilinogen, UA: 0.2 mg/dL (ref 0.0–1.0)
pH: 6 (ref 5.0–8.0)

## 2014-09-06 NOTE — Discharge Instructions (Signed)

## 2014-09-06 NOTE — MAU Note (Addendum)
Cramping started last night. Became worse today and sometimes sharp and severe. No bleeding or vaginal D/C. States pain is primarily in RLQ. Negative for rebound pain, no guarding. Reasurred with + FHT's.

## 2014-09-06 NOTE — MAU Provider Note (Signed)
Attestation of Attending Supervision of Advanced Practitioner (PA/CNM/NP): Evaluation and management procedures were performed by the Advanced Practitioner under my supervision and collaboration.  I have reviewed the Advanced Practitioner's note and chart, and I agree with the management and plan.  Jacob Stinson, DO Attending Physician Faculty Practice, [facility]  

## 2014-09-06 NOTE — MAU Provider Note (Signed)
Chief Complaint: Abdominal Cramping   First Provider Initiated Contact with Patient 09/06/14 1434     SUBJECTIVE HPI: Ginelle Bays is a 23 y.o. G2P0010 at [redacted]w[redacted]d by LMP who presents with one-day history lower abdominal cramping right greater than left. Denies dysuria, frequency or urgency of urination, hematuria. Denies vaginal bleeding. HIV, GC/chlamydia, wet prep all negative 08/06/2014. Had YS on early [place], also small posterior fibroids. Has MPW and list of providers.  Past Medical History  Diagnosis Date  . Headache   . Fibroid    OB History  Gravida Para Term Preterm AB SAB TAB Ectopic Multiple Living  2 0 0 0 1 1 0 0 0 0     # Outcome Date GA Lbr Len/2nd Weight Sex Delivery Anes PTL Lv  2 CUR           1 SAB              Past Surgical History  Procedure Laterality Date  . Tonsillectomy    . Knee arthroscopy Right    History   Social History  . Marital Status: Married    Spouse Name: N/A    Number of Children: N/A  . Years of Education: N/A   Occupational History  . Not on file.   Social History Main Topics  . Smoking status: Never Smoker   . Smokeless tobacco: Never Used  . Alcohol Use: No  . Drug Use: No  . Sexual Activity: Yes    Birth Control/ Protection: None   Other Topics Concern  . Not on file   Social History Narrative  . No narrative on file   No current facility-administered medications on file prior to encounter.   Current Outpatient Prescriptions on File Prior to Encounter  Medication Sig Dispense Refill  . FOLIC ACID PO Take 1 each by mouth daily.       No Known Allergies  ROS: Pertinent items in HPI  OBJECTIVE Blood pressure 100/61, pulse 65, temperature 98.1 F (36.7 C), temperature source Oral, resp. rate 18, height 5\' 2"  (1.575 m), weight 61.689 kg (136 lb), last menstrual period 06/28/2014. GENERAL: Well-developed, well-nourished female in no acute distress.  HEENT: Normocephalic HEART: normal rate RESP: normal  effort ABDOMEN: Soft, non-tender, DT 165 EXTREMITIES: Nontender, no edema NEURO: Alert and oriented  BIMANUAL: cervix L/C; uterus 10 wk size, no adnexal tenderness or masses  LAB RESULTS No results found for this or any previous visit (from the past 24 hour(s)).  IMAGING No results found.  MAU COURSE  ASSESSMENT 1. Abdominal pain affecting pregnancy, antepartum   G2P0010 at [redacted]w[redacted]d, viable IUP  PLAN Discharge home with reassurance pregnancy is viable    Medication List    STOP taking these medications       FOLIC ACID PO      TAKE these medications       prenatal multivitamin Tabs tablet  Take 1 tablet by mouth at bedtime.       Follow-up Information   Follow up with [facility] [place]. Schedule an appointment as soon as possible for a visit in 3 weeks.   Contact information:   [place] [id number] [address] Yasir Kitner, CNM 09/06/2014  2:37 PM

## 2014-09-12 ENCOUNTER — Encounter (HOSPITAL_COMMUNITY): Payer: Self-pay | Admitting: *Deleted

## 2014-09-16 LAB — OB RESULTS CONSOLE ABO/RH: RH Type: NEGATIVE

## 2014-09-16 LAB — OB RESULTS CONSOLE HIV ANTIBODY (ROUTINE TESTING): HIV: NONREACTIVE

## 2014-09-16 LAB — OB RESULTS CONSOLE GC/CHLAMYDIA
CHLAMYDIA, DNA PROBE: NEGATIVE
Gonorrhea: NEGATIVE

## 2014-09-16 LAB — OB RESULTS CONSOLE HEPATITIS B SURFACE ANTIGEN: Hepatitis B Surface Ag: NEGATIVE

## 2014-09-16 LAB — OB RESULTS CONSOLE RPR: RPR: NONREACTIVE

## 2014-09-16 LAB — OB RESULTS CONSOLE ANTIBODY SCREEN: Antibody Screen: NEGATIVE

## 2014-09-16 LAB — OB RESULTS CONSOLE RUBELLA ANTIBODY, IGM: Rubella: IMMUNE

## 2014-10-14 ENCOUNTER — Inpatient Hospital Stay (HOSPITAL_COMMUNITY)
Admission: AD | Admit: 2014-10-14 | Discharge: 2014-10-14 | Disposition: A | Discharge status: Home / Self Care | Payer: Medicaid Other | Source: Ambulatory Visit | Attending: Obstetrics & Gynecology | Admitting: Obstetrics & Gynecology

## 2014-10-14 ENCOUNTER — Other Ambulatory Visit: Payer: Self-pay

## 2014-10-14 DIAGNOSIS — O9989 Other specified diseases and conditions complicating pregnancy, childbirth and the puerperium: Secondary | ICD-10-CM | POA: Diagnosis not present

## 2014-10-14 DIAGNOSIS — O99342 Other mental disorders complicating pregnancy, second trimester: Secondary | ICD-10-CM | POA: Diagnosis not present

## 2014-10-14 DIAGNOSIS — O3412 Maternal care for benign tumor of corpus uteri, second trimester: Secondary | ICD-10-CM | POA: Diagnosis not present

## 2014-10-14 DIAGNOSIS — O360920 Maternal care for other rhesus isoimmunization, second trimester, not applicable or unspecified: Secondary | ICD-10-CM | POA: Insufficient documentation

## 2014-10-14 DIAGNOSIS — D259 Leiomyoma of uterus, unspecified: Secondary | ICD-10-CM | POA: Insufficient documentation

## 2014-10-14 DIAGNOSIS — Z3A15 15 weeks gestation of pregnancy: Secondary | ICD-10-CM | POA: Diagnosis not present

## 2014-10-14 DIAGNOSIS — R079 Chest pain, unspecified: Secondary | ICD-10-CM | POA: Diagnosis present

## 2014-10-14 DIAGNOSIS — R42 Dizziness and giddiness: Secondary | ICD-10-CM | POA: Insufficient documentation

## 2014-10-14 DIAGNOSIS — D219 Benign neoplasm of connective and other soft tissue, unspecified: Secondary | ICD-10-CM | POA: Diagnosis present

## 2014-10-14 DIAGNOSIS — F909 Attention-deficit hyperactivity disorder, unspecified type: Secondary | ICD-10-CM | POA: Diagnosis present

## 2014-10-14 DIAGNOSIS — Z6791 Unspecified blood type, Rh negative: Secondary | ICD-10-CM | POA: Diagnosis present

## 2014-10-14 DIAGNOSIS — O26899 Other specified pregnancy related conditions, unspecified trimester: Secondary | ICD-10-CM | POA: Diagnosis present

## 2014-10-14 DIAGNOSIS — F411 Generalized anxiety disorder: Secondary | ICD-10-CM | POA: Diagnosis not present

## 2014-10-14 DIAGNOSIS — F319 Bipolar disorder, unspecified: Secondary | ICD-10-CM | POA: Diagnosis not present

## 2014-10-14 LAB — CBC WITH DIFFERENTIAL/PLATELET
BASOS ABS: 0 10*3/uL (ref 0.0–0.1)
Basophils Relative: 0 % (ref 0–1)
EOS ABS: 0.1 10*3/uL (ref 0.0–0.7)
EOS PCT: 1 % (ref 0–5)
HCT: 37.9 % (ref 36.0–46.0)
Hemoglobin: 13.2 g/dL (ref 12.0–15.0)
LYMPHS PCT: 20 % (ref 12–46)
Lymphs Abs: 2.2 10*3/uL (ref 0.7–4.0)
MCH: 30.7 pg (ref 26.0–34.0)
MCHC: 34.8 g/dL (ref 30.0–36.0)
MCV: 88.1 fL (ref 78.0–100.0)
Monocytes Absolute: 0.8 10*3/uL (ref 0.1–1.0)
Monocytes Relative: 8 % (ref 3–12)
Neutro Abs: 7.7 10*3/uL (ref 1.7–7.7)
Neutrophils Relative %: 71 % (ref 43–77)
PLATELETS: 233 10*3/uL (ref 150–400)
RBC: 4.3 MIL/uL (ref 3.87–5.11)
RDW: 13.3 % (ref 11.5–15.5)
WBC: 10.8 10*3/uL — AB (ref 4.0–10.5)

## 2014-10-14 LAB — URINALYSIS, ROUTINE W REFLEX MICROSCOPIC
Bilirubin Urine: NEGATIVE
Glucose, UA: NEGATIVE mg/dL
Hgb urine dipstick: NEGATIVE
Ketones, ur: NEGATIVE mg/dL
Leukocytes, UA: NEGATIVE
NITRITE: NEGATIVE
Protein, ur: NEGATIVE mg/dL
Specific Gravity, Urine: <1.005 — ABNORMAL LOW (ref 1.005–1.030)
UROBILINOGEN UA: 0.2 mg/dL (ref 0.0–1.0)
pH: 6.5 (ref 5.0–8.0)

## 2014-10-14 LAB — COMPREHENSIVE METABOLIC PANEL
ALT: 11 U/L (ref 0–35)
AST: 15 U/L (ref 0–37)
Albumin: 3.5 g/dL (ref 3.5–5.2)
Alkaline Phosphatase: 41 U/L (ref 39–117)
Anion gap: 12 (ref 5–15)
BUN: 6 mg/dL (ref 6–23)
CALCIUM: 9.3 mg/dL (ref 8.4–10.5)
CO2: 22 mEq/L (ref 19–32)
Chloride: 99 mEq/L (ref 96–112)
Creatinine, Ser: 0.52 mg/dL (ref 0.50–1.10)
GFR calc Af Amer: >90 mL/min (ref >90)
GFR calc non Af Amer: >90 mL/min (ref >90)
Glucose, Bld: 85 mg/dL (ref 70–99)
Potassium: 4.4 mEq/L (ref 3.7–5.3)
SODIUM: 133 meq/L — AB (ref 137–147)
Total Bilirubin: 0.6 mg/dL (ref 0.3–1.2)
Total Protein: 6.6 g/dL (ref 6.0–8.3)

## 2014-10-14 LAB — TSH: TSH: 1.25 u[IU]/mL (ref 0.350–4.500)

## 2014-10-14 MED ORDER — LACTATED RINGERS IV BOLUS (SEPSIS)
500.0000 mL | Freq: Once | INTRAVENOUS | Status: AC
Start: 2014-10-14 — End: 2014-10-14
  Administered 2014-10-14: 500 mL via INTRAVENOUS

## 2014-10-14 MED ORDER — ONDANSETRON HCL 4 MG/2ML IJ SOLN
4.0000 mg | Freq: Once | INTRAMUSCULAR | Status: AC
  Administered 2014-10-14: 4 mg via INTRAVENOUS

## 2014-10-14 MED ORDER — GI COCKTAIL ~~LOC~~
30.0000 mL | Freq: Once | ORAL | Status: AC
  Administered 2014-10-14: 30 mL via ORAL

## 2014-10-14 MED ORDER — LACTATED RINGERS IV SOLN: INTRAVENOUS | Status: DC

## 2014-10-14 NOTE — MAU Note (Addendum)
Started feeling chest pain when laid down last night- feels like a tightness, is difficult to breath, hurts when she inhales. Feeling dizzy, hearing is like in a tunnel.   vomited x1, had diarrhea one time

## 2014-10-14 NOTE — MAU Note (Signed)
Pt had dizzy spell after blood draw.  Comfort measures.  Brought by wc to hallway of MAU.[name] Plan discussed.

## 2014-10-14 NOTE — MAU Provider Note (Signed)
History   23 yo G2P0010 at 15 3/7 weeks presented after calling office with c/o chest pain, dizziness, vomiting x 1 today.  Had onset of mid-sternal chest pain last night, then dizzy this am with single episode of vomiting.  Denies fever, cough, asthma, congestion, viral exposure, dysuria, cramping, bleeding, or vaginal d/c.  Patient is tearful, "afraid for the baby".    Dizziness is only present with moving/standing.    Feeling better after IV hydration, Zofran, GI cocktail.  Patient Active Problem List   Diagnosis Date Noted  . Chest pain 10/14/2014  . Bipolar disorder 10/14/2014  . Generalized anxiety disorder 10/14/2014  . Rh negative, antepartum 10/14/2014  . Fibroid x 2, small 10/14/2014  . ADHD (attention deficit hyperactivity disorder) 10/14/2014    Chief Complaint  Patient presents with  . Chest Pain  . Dizziness   HPI:  See above  OB History    Gravida Para Term Preterm AB TAB SAB Ectopic Multiple Living   2 0 0 0 1 0 1 0 0 0       Past Medical History  Diagnosis Date  . Headache   . Fibroid     Past Surgical History  Procedure Laterality Date  . Tonsillectomy    . Knee arthroscopy Right     No family history on file.  History  Substance Use Topics  . Smoking status: Never Smoker   . Smokeless tobacco: Never Used  . Alcohol Use: No    Allergies: No Known Allergies  Prescriptions prior to admission  Medication Sig Dispense Refill Last Dose  . calcium carbonate (TUMS - DOSED IN MG ELEMENTAL CALCIUM) 500 MG chewable tablet Chew 2 tablets by mouth daily as needed for indigestion or heartburn.   10/14/2014 at Unknown time  . Prenatal Vit-Fe Fumarate-FA (PRENATAL MULTIVITAMIN) TABS tablet Take 1 tablet by mouth at bedtime.   10/13/2014 at Unknown time    ROS:  Chest pain, dizziness with movement. Physical Exam   Blood pressure 99/59, pulse 51, temperature 97.8 F (36.6 C), temperature source Oral, resp. rate 16, height 5\' 2"  (1.575 m), weight 136 lb  (61.689 kg), last menstrual period 06/28/2014, SpO2 100 %.   Filed Vitals:   10/14/14 1319 10/14/14 1320 10/14/14 1510 10/14/14 1540  BP: 101/64 93/59 99/58  99/59  Pulse: 54 76 50 51  Temp:      TempSrc:      Resp:    16  Height:      Weight:      SpO2:         Physical Exam  In NAD at present Ears clear Chest clear Heart RRR without murmur, rate ranging from 50-68, up to 76 with standing Abd gravid, NT Pelvic--deferred Ext no edema, DTR 1+, negative Homan's  FHR 140 by doppler  Results for orders placed or performed during the hospital encounter of 10/14/14 (from the past 24 hour(s))  Urinalysis, Routine w reflex microscopic     Status: Abnormal   Collection Time: 10/14/14 12:05 PM  Result Value Ref Range   Color, Urine YELLOW YELLOW   APPearance CLEAR CLEAR   Specific Gravity, Urine <1.005 (L) 1.005 - 1.030   pH 6.5 5.0 - 8.0   Glucose, UA NEGATIVE NEGATIVE mg/dL   Hgb urine dipstick NEGATIVE NEGATIVE   Bilirubin Urine NEGATIVE NEGATIVE   Ketones, ur NEGATIVE NEGATIVE mg/dL   Protein, ur NEGATIVE NEGATIVE mg/dL   Urobilinogen, UA 0.2 0.0 - 1.0 mg/dL   Nitrite NEGATIVE NEGATIVE  Leukocytes, UA NEGATIVE NEGATIVE  CBC with Differential     Status: Abnormal   Collection Time: 10/14/14 12:32 PM  Result Value Ref Range   WBC 10.8 (H) 4.0 - 10.5 K/uL   RBC 4.30 3.87 - 5.11 MIL/uL   Hemoglobin 13.2 12.0 - 15.0 g/dL   HCT 37.9 36.0 - 46.0 %   MCV 88.1 78.0 - 100.0 fL   MCH 30.7 26.0 - 34.0 pg   MCHC 34.8 30.0 - 36.0 g/dL   RDW 13.3 11.5 - 15.5 %   Platelets 233 150 - 400 K/uL   Neutrophils Relative % 71 43 - 77 %   Neutro Abs 7.7 1.7 - 7.7 K/uL   Lymphocytes Relative 20 12 - 46 %   Lymphs Abs 2.2 0.7 - 4.0 K/uL   Monocytes Relative 8 3 - 12 %   Monocytes Absolute 0.8 0.1 - 1.0 K/uL   Eosinophils Relative 1 0 - 5 %   Eosinophils Absolute 0.1 0.0 - 0.7 K/uL   Basophils Relative 0 0 - 1 %   Basophils Absolute 0.0 0.0 - 0.1 K/uL  Comprehensive metabolic panel      Status: Abnormal   Collection Time: 10/14/14 12:32 PM  Result Value Ref Range   Sodium 133 (L) 137 - 147 mEq/L   Potassium 4.4 3.7 - 5.3 mEq/L   Chloride 99 96 - 112 mEq/L   CO2 22 19 - 32 mEq/L   Glucose, Bld 85 70 - 99 mg/dL   BUN 6 6 - 23 mg/dL   Creatinine, Ser 0.52 0.50 - 1.10 mg/dL   Calcium 9.3 8.4 - 10.5 mg/dL   Total Protein 6.6 6.0 - 8.3 g/dL   Albumin 3.5 3.5 - 5.2 g/dL   AST 15 0 - 37 U/L   ALT 11 0 - 35 U/L   Alkaline Phosphatase 41 39 - 117 U/L   Total Bilirubin 0.6 0.3 - 1.2 mg/dL   GFR calc non Af Amer >90 >90 mL/min   GFR calc Af Amer >90 >90 mL/min   Anion gap 12 5 - 15  TSH     Status: None   Collection Time: 10/14/14 12:32 PM  Result Value Ref Range   TSH 1.250 0.350 - 4.500 uIU/mL   EKG:  "Sinus bradycardia, with 1st degree AV heart block, otherwise normal ECG"--reviewed EKG with Dr. Royce Macadamia, Anesthesia--"normal EKG"   ED Course  Assessment: IUP at 15 3/7 weeks Chest pain Dizziness  Plan: Home to increase rest, fluids. F/u as scheduled on 12/8 with [facility]. To call with any worsening of sx. Will plan referral to cardiologist--will have office coordinate this early next week.   Donnel Saxon CNM, MSN 10/14/2014 3:43 PM

## 2014-10-14 NOTE — Discharge Instructions (Signed)
Call with any questions or concerns. Rest this weekend, drink lots of fluids.

## 2014-10-18 NOTE — Progress Notes (Signed)
Patient ID: Laurie Phillips, female   DOB: 09-Dec-1990, 23 y.o.   MRN: [id number]    23 yo pregnant female referred by OB/Gyn for bradycardia and firt degree AV block  She is in her 2nd trimester First pregnancy No issues  She had some atypical chest Pains last week that were fleeting sharp and non exertional Have resolved.  This is what prompted ECG.  She has no palpitations or presyncope.  She takes no routine Meds.  No family history of sudden death or need for pacer.  She has no exertional dyspnea.  Having [place] of baby 11/16/14  TSH normal on 10/14/14     ROS: Denies fever, malais, weight loss, blurry vision, decreased visual acuity, cough, sputum, SOB, hemoptysis, pleuritic pain, palpitaitons, heartburn, abdominal pain, melena, lower extremity edema, claudication, or rash.  All other systems reviewed and negative   General: Affect appropriate Healthy:  appears stated age 89: normal Neck supple with no adenopathy JVP normal no bruits no thyromegaly Lungs clear with no wheezing and good diaphragmatic motion Heart:  S1/S2 no murmur,rub, gallop or click PMI normal Abdomen: benighn, BS positve, no tenderness, no AAA no bruit.  No HSM or HJR Distal pulses intact with no bruits No edema Neuro non-focal Skin warm and dry No muscular weakness  Medications Current Outpatient Prescriptions  Medication Sig Dispense Refill  . calcium carbonate (TUMS - DOSED IN MG ELEMENTAL CALCIUM) 500 MG chewable tablet Chew 2 tablets by mouth daily as needed for indigestion or heartburn.    . Prenatal Vit-Fe Fumarate-FA (PRENATAL MULTIVITAMIN) TABS tablet Take 1 tablet by mouth at bedtime.     No current facility-administered medications for this visit.    Allergies Review of patient's allergies indicates no known allergies.  Family History: No family history on file.  Social History: History   Social History  . Marital Status: Married    Spouse Name: N/A    Number of Children: N/A  . Years  of Education: N/A   Occupational History  . Not on file.   Social History Main Topics  . Smoking status: Never Smoker   . Smokeless tobacco: Never Used  . Alcohol Use: No  . Drug Use: No  . Sexual Activity: Yes    Birth Control/ Protection: None   Other Topics Concern  . Not on file   Social History Narrative    Past Surgical History  Procedure Laterality Date  . Tonsillectomy    . Knee arthroscopy Right     Past Medical History  Diagnosis Date  . Headache   . Fibroid   . Sinus bradycardia   . Chest pain   . First degree AV block   . Bipolar 1 disorder   . Generalized anxiety disorder   . Rh negative, antepartum   . Fibroid   . Attention deficit hyperactivity disorder     Electrocardiogram:  12/4   SR rate 50  PR 314  Otherwise normal   Assessment and Plan

## 2014-10-19 ENCOUNTER — Ambulatory Visit (INDEPENDENT_AMBULATORY_CARE_PROVIDER_SITE_OTHER): Payer: Medicaid Other | Admitting: Cardiovascular Disease

## 2014-10-19 ENCOUNTER — Encounter: Payer: Self-pay | Admitting: Cardiovascular Disease

## 2014-10-19 VITALS — BP 106/66 | HR 75 | Ht 62.0 in | Wt 137.1 lb

## 2014-10-19 DIAGNOSIS — R072 Precordial pain: Secondary | ICD-10-CM

## 2014-10-19 DIAGNOSIS — I440 Atrioventricular block, first degree: Secondary | ICD-10-CM

## 2014-10-19 DIAGNOSIS — R001 Bradycardia, unspecified: Secondary | ICD-10-CM

## 2014-10-19 NOTE — Assessment & Plan Note (Signed)
Resolved atypical likely musculoskeletal Will check echo

## 2014-10-19 NOTE — Patient Instructions (Addendum)

## 2014-10-19 NOTE — Assessment & Plan Note (Signed)
Fairly long for asymptomatic young female  TSH normal No high risk family history Post partum will order cardiac MRI to r/o infiltrative disease Can do MRI in pregnant female but only for more threatening conditions like aortic aneurysms Today ECG SR rate 63  Normal other than PR interval of 364

## 2014-10-19 NOTE — Assessment & Plan Note (Signed)
Relative  Asymptomatic  With ambulation easily in 70 range  Post partum will do ETT to establish normal chronotropic response with exercise

## 2014-10-24 ENCOUNTER — Ambulatory Visit (HOSPITAL_COMMUNITY): Payer: Medicaid Other | Attending: Cardiology | Admitting: Radiology

## 2014-10-24 DIAGNOSIS — I340 Nonrheumatic mitral (valve) insufficiency: Secondary | ICD-10-CM | POA: Insufficient documentation

## 2014-10-24 DIAGNOSIS — Z3A16 16 weeks gestation of pregnancy: Secondary | ICD-10-CM | POA: Diagnosis not present

## 2014-10-24 DIAGNOSIS — O26892 Other specified pregnancy related conditions, second trimester: Secondary | ICD-10-CM | POA: Diagnosis not present

## 2014-10-24 DIAGNOSIS — R001 Bradycardia, unspecified: Secondary | ICD-10-CM | POA: Diagnosis present

## 2014-10-24 DIAGNOSIS — I071 Rheumatic tricuspid insufficiency: Secondary | ICD-10-CM | POA: Insufficient documentation

## 2014-10-24 DIAGNOSIS — R079 Chest pain, unspecified: Secondary | ICD-10-CM | POA: Diagnosis not present

## 2014-10-24 DIAGNOSIS — I440 Atrioventricular block, first degree: Secondary | ICD-10-CM | POA: Insufficient documentation

## 2014-10-24 NOTE — Progress Notes (Signed)
Echocardiogram performed.  

## 2015-03-07 ENCOUNTER — Inpatient Hospital Stay (HOSPITAL_COMMUNITY): Payer: Medicaid Other

## 2015-03-07 ENCOUNTER — Encounter (HOSPITAL_COMMUNITY): Payer: Self-pay | Admitting: *Deleted

## 2015-03-07 ENCOUNTER — Inpatient Hospital Stay (HOSPITAL_COMMUNITY)
Admission: AD | Admit: 2015-03-07 | Discharge: 2015-03-13 | DRG: 765 | Disposition: A | Discharge status: Home / Self Care | Payer: Medicaid Other | Source: Ambulatory Visit | Attending: Obstetrics and Gynecology | Admitting: Obstetrics and Gynecology

## 2015-03-07 DIAGNOSIS — Z3A36 36 weeks gestation of pregnancy: Secondary | ICD-10-CM | POA: Diagnosis present

## 2015-03-07 DIAGNOSIS — D252 Subserosal leiomyoma of uterus: Secondary | ICD-10-CM | POA: Diagnosis present

## 2015-03-07 DIAGNOSIS — I442 Atrioventricular block, complete: Secondary | ICD-10-CM | POA: Diagnosis not present

## 2015-03-07 DIAGNOSIS — F411 Generalized anxiety disorder: Secondary | ICD-10-CM | POA: Diagnosis present

## 2015-03-07 DIAGNOSIS — O9942 Diseases of the circulatory system complicating childbirth: Secondary | ICD-10-CM | POA: Diagnosis present

## 2015-03-07 DIAGNOSIS — F319 Bipolar disorder, unspecified: Secondary | ICD-10-CM | POA: Diagnosis present

## 2015-03-07 DIAGNOSIS — F909 Attention-deficit hyperactivity disorder, unspecified type: Secondary | ICD-10-CM | POA: Diagnosis present

## 2015-03-07 DIAGNOSIS — O1493 Unspecified pre-eclampsia, third trimester: Secondary | ICD-10-CM

## 2015-03-07 DIAGNOSIS — Z9889 Other specified postprocedural states: Secondary | ICD-10-CM | POA: Diagnosis not present

## 2015-03-07 DIAGNOSIS — O99344 Other mental disorders complicating childbirth: Secondary | ICD-10-CM | POA: Diagnosis present

## 2015-03-07 DIAGNOSIS — I440 Atrioventricular block, first degree: Secondary | ICD-10-CM | POA: Diagnosis present

## 2015-03-07 DIAGNOSIS — O9081 Anemia of the puerperium: Secondary | ICD-10-CM | POA: Diagnosis not present

## 2015-03-07 DIAGNOSIS — O9943 Diseases of the circulatory system complicating the puerperium: Secondary | ICD-10-CM | POA: Diagnosis not present

## 2015-03-07 DIAGNOSIS — D649 Anemia, unspecified: Secondary | ICD-10-CM | POA: Diagnosis not present

## 2015-03-07 DIAGNOSIS — Z98891 History of uterine scar from previous surgery: Secondary | ICD-10-CM

## 2015-03-07 DIAGNOSIS — O1490 Unspecified pre-eclampsia, unspecified trimester: Secondary | ICD-10-CM | POA: Diagnosis present

## 2015-03-07 DIAGNOSIS — O1413 Severe pre-eclampsia, third trimester: Secondary | ICD-10-CM | POA: Diagnosis present

## 2015-03-07 DIAGNOSIS — O133 Gestational [pregnancy-induced] hypertension without significant proteinuria, third trimester: Secondary | ICD-10-CM | POA: Diagnosis present

## 2015-03-07 DIAGNOSIS — O3413 Maternal care for benign tumor of corpus uteri, third trimester: Secondary | ICD-10-CM | POA: Diagnosis present

## 2015-03-07 HISTORY — DX: Pneumonia, unspecified organism: J18.9

## 2015-03-07 LAB — COMPREHENSIVE METABOLIC PANEL
ALT: 33 U/L (ref 0–35)
AST: 34 U/L (ref 0–37)
Albumin: 2.6 g/dL — ABNORMAL LOW (ref 3.5–5.2)
Alkaline Phosphatase: 118 U/L — ABNORMAL HIGH (ref 39–117)
Anion gap: 8 (ref 5–15)
BUN: 13 mg/dL (ref 6–23)
CALCIUM: 8.3 mg/dL — AB (ref 8.4–10.5)
CO2: 22 mmol/L (ref 19–32)
Chloride: 105 mmol/L (ref 96–112)
Creatinine, Ser: 0.79 mg/dL (ref 0.50–1.10)
GLUCOSE: 73 mg/dL (ref 70–99)
Potassium: 4.3 mmol/L (ref 3.5–5.1)
Sodium: 135 mmol/L (ref 135–145)
TOTAL PROTEIN: 5.5 g/dL — AB (ref 6.0–8.3)
Total Bilirubin: 0.4 mg/dL (ref 0.3–1.2)

## 2015-03-07 LAB — PROTEIN / CREATININE RATIO, URINE
Creatinine, Urine: 46 mg/dL
Protein Creatinine Ratio: 6.15 — ABNORMAL HIGH (ref 0.00–0.15)
Total Protein, Urine: 283 mg/dL

## 2015-03-07 LAB — CBC
HCT: 33.2 % — ABNORMAL LOW (ref 36.0–46.0)
Hemoglobin: 11.1 g/dL — ABNORMAL LOW (ref 12.0–15.0)
MCH: 29.1 pg (ref 26.0–34.0)
MCHC: 33.4 g/dL (ref 30.0–36.0)
MCV: 86.9 fL (ref 78.0–100.0)
PLATELETS: 229 10*3/uL (ref 150–400)
RBC: 3.82 MIL/uL — ABNORMAL LOW (ref 3.87–5.11)
RDW: 15 % (ref 11.5–15.5)
WBC: 11.3 10*3/uL — ABNORMAL HIGH (ref 4.0–10.5)

## 2015-03-07 LAB — OB RESULTS CONSOLE GBS: GBS: NEGATIVE

## 2015-03-07 LAB — LACTATE DEHYDROGENASE: LDH: 153 U/L (ref 94–250)

## 2015-03-07 LAB — GROUP B STREP BY PCR: GROUP B STREP BY PCR: NEGATIVE

## 2015-03-07 LAB — URIC ACID: Uric Acid, Serum: 7.3 mg/dL — ABNORMAL HIGH (ref 2.4–7.0)

## 2015-03-07 MED ORDER — LACTATED RINGERS IV SOLN
INTRAVENOUS | Status: DC
  Administered 2015-03-08 (×2): via INTRAVENOUS

## 2015-03-07 MED ORDER — MAGNESIUM SULFATE BOLUS VIA INFUSION
4.0000 g | Freq: Once | INTRAVENOUS | Status: AC
  Administered 2015-03-07: 4 g via INTRAVENOUS
  Filled 2015-03-07: qty 500

## 2015-03-07 MED ORDER — TERBUTALINE SULFATE 1 MG/ML IJ SOLN: 0.2500 mg | Freq: Once | INTRAMUSCULAR | Status: AC | PRN

## 2015-03-07 MED ORDER — CITRIC ACID-SODIUM CITRATE 334-500 MG/5ML PO SOLN
30.0000 mL | ORAL | Status: DC | PRN
  Administered 2015-03-08: 30 mL via ORAL
  Filled 2015-03-07: qty 15

## 2015-03-07 MED ORDER — LIDOCAINE HCL (PF) 1 % IJ SOLN: 30.0000 mL | INTRAMUSCULAR | Status: DC | PRN

## 2015-03-07 MED ORDER — DOCUSATE SODIUM 100 MG PO CAPS: 100.0000 mg | ORAL_CAPSULE | Freq: Every day | ORAL | Status: DC

## 2015-03-07 MED ORDER — LACTATED RINGERS IV SOLN
INTRAVENOUS | Status: DC
  Administered 2015-03-07: 18:00:00 via INTRAVENOUS

## 2015-03-07 MED ORDER — ZOLPIDEM TARTRATE 5 MG PO TABS: 5.0000 mg | ORAL_TABLET | Freq: Every evening | ORAL | Status: DC | PRN

## 2015-03-07 MED ORDER — OXYTOCIN 40 UNITS IN LACTATED RINGERS INFUSION - SIMPLE MED
62.5000 mL/h | INTRAVENOUS | Status: DC
  Filled 2015-03-07: qty 1000

## 2015-03-07 MED ORDER — ONDANSETRON HCL 4 MG/2ML IJ SOLN
4.0000 mg | Freq: Four times a day (QID) | INTRAMUSCULAR | Status: DC | PRN
  Administered 2015-03-08: 4 mg via INTRAVENOUS

## 2015-03-07 MED ORDER — BETAMETHASONE SOD PHOS & ACET 6 (3-3) MG/ML IJ SUSP
12.0000 mg | Freq: Once | INTRAMUSCULAR | Status: AC
  Administered 2015-03-07: 12 mg via INTRAMUSCULAR
  Filled 2015-03-07: qty 2

## 2015-03-07 MED ORDER — BETAMETHASONE SOD PHOS & ACET 6 (3-3) MG/ML IJ SUSP
12.0000 mg | Freq: Once | INTRAMUSCULAR | Status: DC
  Filled 2015-03-07: qty 2

## 2015-03-07 MED ORDER — ACETAMINOPHEN 325 MG PO TABS: 650.0000 mg | ORAL_TABLET | ORAL | Status: DC | PRN

## 2015-03-07 MED ORDER — LACTATED RINGERS IV SOLN: 500.0000 mL | INTRAVENOUS | Status: DC | PRN

## 2015-03-07 MED ORDER — ACETAMINOPHEN 325 MG PO TABS
650.0000 mg | ORAL_TABLET | ORAL | Status: DC | PRN
  Administered 2015-03-07: 650 mg via ORAL
  Filled 2015-03-07: qty 2

## 2015-03-07 MED ORDER — MAGNESIUM SULFATE 50 % IJ SOLN
1.5000 g/h | INTRAVENOUS | Status: DC
  Administered 2015-03-07 – 2015-03-08 (×2): 2 g/h via INTRAVENOUS
  Filled 2015-03-07 (×2): qty 80

## 2015-03-07 MED ORDER — OXYTOCIN BOLUS FROM INFUSION: 500.0000 mL | INTRAVENOUS | Status: DC

## 2015-03-07 MED ORDER — CALCIUM CARBONATE ANTACID 500 MG PO CHEW: 2.0000 | CHEWABLE_TABLET | Freq: Every day | ORAL | Status: DC | PRN

## 2015-03-07 MED ORDER — MISOPROSTOL 25 MCG QUARTER TABLET
25.0000 ug | ORAL_TABLET | ORAL | Status: DC | PRN
  Administered 2015-03-07: 25 ug via VAGINAL
  Filled 2015-03-07: qty 0.25

## 2015-03-07 MED ORDER — PRENATAL MULTIVITAMIN CH: 1.0000 | ORAL_TABLET | Freq: Every day | ORAL | Status: DC

## 2015-03-07 MED ORDER — CALCIUM CARBONATE ANTACID 500 MG PO CHEW
2.0000 | CHEWABLE_TABLET | ORAL | Status: DC | PRN
  Administered 2015-03-07: 400 mg via ORAL
  Filled 2015-03-07: qty 1

## 2015-03-07 MED ORDER — OXYCODONE-ACETAMINOPHEN 5-325 MG PO TABS: 1.0000 | ORAL_TABLET | ORAL | Status: DC | PRN

## 2015-03-07 MED ORDER — MAGNESIUM SULFATE 50 % IJ SOLN: 4.0000 g/h | Freq: Once | INTRAVENOUS | Status: DC

## 2015-03-07 MED ORDER — OXYCODONE-ACETAMINOPHEN 5-325 MG PO TABS: 2.0000 | ORAL_TABLET | ORAL | Status: DC | PRN

## 2015-03-07 MED ORDER — MAGNESIUM SULFATE 50 % IJ SOLN: 2.0000 g/h | INTRAVENOUS | Status: DC

## 2015-03-07 NOTE — Plan of Care (Signed)
Problem: Consults Goal: Birthing Suites Patient Information Press F2 to bring up selections list   Pt < [redacted] weeks EGA     

## 2015-03-07 NOTE — H&P (Signed)
Laurie Phillips is a 24 y.o. female, G2 P0 at 36.0 weeks sent from the office for abn [facility] labs to be admitted to [facility] for further eval.  Pt report a history a seizure x1 in 2014.  Patient Active Problem List   Diagnosis Date Noted  . Normal fetus, antepartum 03/07/2015  . Bradycardia 10/19/2014  . First degree AV block 10/19/2014  . Chest pain 10/14/2014  . Bipolar disorder 10/14/2014  . Generalized anxiety disorder 10/14/2014  . Rh negative, antepartum 10/14/2014  . Fibroid x 2, small 10/14/2014  . ADHD (attention deficit hyperactivity disorder) 10/14/2014    Pregnancy Course: Patient entered care at 12.1 weeks.   EDC of 04/04/15 was established by [place].      [place] evaluations:   12.4 weeks - anteverted ut. normal fluid. Two suberosal fibroids. Fib one: L ant. fundus: 3.9 cm x 2.9 cm x 3.7 cm. Fib two: R ant: 1.3 cm x 1.2 cm.  CRL is c/w LMP GA., NT = 1.6 cm, NT 1.6 cm   19.6 weeks - Anatomy:Breech. Posterior placenta. No previa. Placenta edge to cx = 8.4 cm. Placental cord insertion seen. Normal fluid AP pocket = 3.6 cm. Measurements c/w LMP GA. Cx closed. Female gender. Anatomy compete.       Significant prenatal events:   Rh negative   Last evaluation:   35.6 weeks   VE:1/50/-2 on 03/06/15  Reason for admission:  ABN PIH labs  Pt States:   Contractions Frequency: none         Contraction severity: none         Fetal activity: +FM  OB History    Gravida Para Term Preterm AB TAB SAB Ectopic Multiple Living   2 0 0 0 1 0 1 0 0 0      Past Medical History  Diagnosis Date  . Headache   . Fibroid   . Sinus bradycardia   . Chest pain   . First degree AV block   . Bipolar 1 disorder   . Generalized anxiety disorder   . Rh negative, antepartum   . Fibroid   . Attention deficit hyperactivity disorder   . Pneumonia    Past Surgical History  Procedure Laterality Date  . Tonsillectomy    . Knee arthroscopy Right    Family History: She was adopted. Family history is unknown by  patient. Social History:  reports that she has never smoked. She has never used smokeless tobacco. She reports that she does not drink alcohol or use illicit drugs.   Prenatal Transfer Tool  Maternal Diabetes: No Genetic Screening: Normal Maternal Ultrasounds/Referrals: Normal Fetal Ultrasounds or other Referrals:  None Maternal Substance Abuse:  No Significant Maternal Medications:  None Significant Maternal Lab Results: Lab values include: Rh negative   ROS:  See HPI above, all other systems are negative  No Known Allergies    Blood pressure 141/81, pulse 43, temperature 98.5 F (36.9 C), temperature source Oral, resp. rate 20, height 5' 2.5" (1.588 m), weight 190 lb (86.183 kg), last menstrual period 06/28/2014, SpO2 100 %.  Maternal Exam:  Uterine Assessment: Contraction frequency is rare.  Abdomen: Gravid, non tender. Fundal height is aga.  Normal external genitalia, vulva, cervix, uterus and adnexa.  No lesions noted on exam.  Pelvis adequate for delivery.  Fetal presentation: Vertex by Leopold's   Fetal Exam:  Monitor Surveillance : Continuous Monitoring   Mode: Ultrasound.  NICHD: Category CTXs: Q none EFW   6 lbs  Physical  Exam: Nursing note and vitals reviewed General: alert and cooperative She appears well nourished Psychiatric: Normal mood and affect. Her behavior is normal Head: Normocephalic Eyes: Pupils are equal, round, and reactive to light Neck: Normal range of motion Cardiovascular: RRR without murmur  Respiratory: CTAB. Effort normal  Abd: soft, non-tender, +BS, no rebound, no guarding  Genitourinary: Vagina normal  Neurological: A&Ox3 Skin: Warm and dry  Musculoskeletal: Normal range of motion  Homan's sign negative bilaterally No evidence of DVTs.  Edema: 1+bilaterally non-pitting edema DTR: 2+ Clonus: None   Prenatal labs: ABO, Rh: --/--/B NEG (04/26 1150) Antibody: POS (04/26 1150) Rubella:   immune RPR: Nonreactive (11/06 0000)  NR HBsAg: Negative (11/06 0000) negative HIV: Non-reactive (11/06 0000)  GBS:  pending Sickle cell/Hgb electrophoresis:  WNL Pap:  wnl 09/11/14 GC:   negative Chlamydia: negative Genetic screenings:  wnl Glucola:  wnl  Assessment:  IUP at 36.0 weeks NICHD: Category Membranes: intact GBS negative   Plan:  Admit to Antepartum unit Routing [facility] orders IV pain medication per orders Prophylaxis abx if necessary MFM consult Diet: regular    Venus Standard, CNM, MSN 03/07/2015, 5:21 PM       All information will be confirmed upon admisson.   I discussed diagnosis with patient and reviewed MFMs recs.  Pt has since been c/o severe HA and spots in front of her eyes.  She said she had a HA since yesterday but it was dull and got worse today.  She denies abdominal pain.  Plan per MFM is now to deliver secondary to preeclampsia with severe features.  Will start mg and get u/s for EFW.  I did discuss possible admission of baby to NICU after birth and pt verbalized understanding. VE yesterday in office was 1/50/-2 and vertex.  Questions were answered.

## 2015-03-07 NOTE — Progress Notes (Addendum)
  Called to room by primary care nurse.  Pt c/o feeling hot, HA and vision changes.  Consulted with Dr Mancel Bale,  1) GBS PCR now 2) transfer to L&D for IOL 3) mag 4gm bolus and 2 gm maint. 4) cytotec 5) repeat PIH labs 6 hour after mag started

## 2015-03-07 NOTE — Progress Notes (Signed)
Venus Standard CNM called and discussed POC with RN . RN to move pt to L&D and to start IOL and Magnesium Sulfate'

## 2015-03-07 NOTE — Progress Notes (Signed)
Laurie Phillips, [id number]   Subjective -Care Assumed of 24 y.o. G2P0010 at 50 wks who is being induced for PIH.  In room to assess and discuss plan of care.  Patient reports mild headache, numbness in hands, and spotty vision, but denies epigastric pain and SOB.  Patient and husband with questions regarding induction process.  Patient requests to eat prior to beginning.  Objective Filed Vitals:   03/07/15 1902  BP: 135/76  Pulse:   Temp:   Resp: 18   I/O last 3 completed shifts: In: 492.1 [P.O.:240; I.V.:252.1] Out: 0  Total I/O In: 225 [P.O.:100; I.V.:125] Out: 100 [Urine:100]  Physical Exam  Constitutional: She is oriented to person, place, and time and well-developed, well-nourished, and in no distress. No distress.  HENT:  Head: Normocephalic and atraumatic.  Eyes: EOM are normal.  Neck: Normal range of motion.  Cardiovascular: Normal rate and normal heart sounds.   Pulmonary/Chest: Effort normal and breath sounds normal.  Abdominal: Soft. Bowel sounds are normal.  Musculoskeletal: Normal range of motion.  Neurological: She is alert and oriented to person, place, and time.  Reflex Scores:      Patellar reflexes are 2+ on the right side and 2+ on the left side. Skin: Skin is warm and dry.    JYN:[id number]: Vertex  Pelvis: -Proven:No -Adequate: Yes Leopolds: -Position:Vertex per [place] -EFW:5lbs 10oz per [place]  Monitoring: FHR: 120 bpm, Mod Var, -Decels, +Accels UC:  None graphed or palpated  Induction/Augmentation Agent: Pitocin: None Cytotec: 1st Dose after eating Membranes: Intact  Assessment IUP at 36wks Cat I FT PIH MgSO4 Infusion Bishop Score: 3 Cervical Ripening  Plan -Discussed r/b of induction including fetal distress, serial induction, pain, and increased risk of c/s delivery -Discussed induction methods including cervical ripening agents, foley bulbs, and pitocin -Questions and concerns addressed -Okay to have regular meal prior to induction  process -Discussed POC in regards to [facility] management including labs and MgSO4 -Continue other mgmt as ordered   Jettson Crable LYNN, CNM 03/07/2015, 8:25 PM   Addendum: Cytotec placed at 2115 SVE: 0/TH/-3 Left, Posterior, Soft

## 2015-03-07 NOTE — Progress Notes (Signed)
Venus Standard CNM notified of pt c/o headache, tylenol given, "pressure" in chest, o2 sats 100. BP 140/80

## 2015-03-07 NOTE — Consult Note (Signed)
Maternal Fetal Medicine Consultation  Requesting Provider(s): Everett Graff, MD  Reason for consultation: IUP at 36 weeks, suspected preeclampsia  HPI: Laurie Phillips is a 24 yo G1P0, EDD 04/04/2015 who is currently at 36w 0d who is admitted due to suspected preeclampsia.  Laurie Phillips reports that she was noted yesterday to have 3+ protein on urine dip during a clinic visit.  Up to this point in her pregnancy, she has not had hypertension.  She reports a 15# weight increase over the last 1-2 weeks.  She also reports some low-grade headaches but denies visual changes or RUQ pain.  The fetus is active.  Her pregnancy course has been complicated by maternal bradycardia.  Admission labs were remarkable for a uric acid of 7.3 mg/dl and a urine protein/creatinine ratio of 6.15.  She was seen by cardiology in early pregnancy and was diagnosed with first degree heart block.  She had a normal maternal echocardiogram and no further follow up was deemed necessary until after delivery.  She denies any other complications during her pregnancy  OB History: OB History    Gravida Para Term Preterm AB TAB SAB Ectopic Multiple Living   2 0 0 0 1 0 1 0 0 0       PMH:  Past Medical History  Diagnosis Date  . Headache   . Fibroid   . Sinus bradycardia   . Chest pain   . First degree AV block   . Bipolar 1 disorder   . Generalized anxiety disorder   . Rh negative, antepartum   . Fibroid   . Attention deficit hyperactivity disorder   . Pneumonia     PSH:  Past Surgical History  Procedure Laterality Date  . Tonsillectomy    . Knee arthroscopy Right    Meds:  No current facility-administered medications on file prior to encounter.   Current Outpatient Prescriptions on File Prior to Encounter  Medication Sig Dispense Refill  . calcium carbonate (TUMS - DOSED IN MG ELEMENTAL CALCIUM) 500 MG chewable tablet Chew 2 tablets by mouth daily as needed for indigestion or heartburn.    . Prenatal Vit-Fe  Fumarate-FA (PRENATAL MULTIVITAMIN) TABS tablet Take 1 tablet by mouth at bedtime.     Allergies: No Known Allergies   FH:  Family History  Problem Relation Age of Onset  . Adopted: Yes  . Family history unknown: Yes   Soc:  History   Social History  . Marital Status: Married    Spouse Name: N/A  . Number of Children: N/A  . Years of Education: N/A   Occupational History  . Not on file.   Social History Main Topics  . Smoking status: Never Smoker   . Smokeless tobacco: Never Used  . Alcohol Use: No  . Drug Use: No  . Sexual Activity: Yes    Birth Control/ Protection: None   Other Topics Concern  . Not on file   Social History Narrative    Review of Systems: no vaginal bleeding or cramping/contractions, no LOF, no nausea/vomiting. All other systems reviewed and are negative.  PE:   Filed Vitals:   03/07/15 1339  BP: 142/84  Pulse: 42  Temp:   Resp:   BPs since admission: 147/80, 142/84, 156/79  GEN: well-appearing female ABD: gravid, NT  Please see separate document for fetal ultrasound report.  Labs: CBC    Component Value Date/Time   WBC 11.3* 03/07/2015 1150   RBC 3.82* 03/07/2015 1150   HGB 11.1* 03/07/2015 1150  HCT 33.2* 03/07/2015 1150   PLT 229 03/07/2015 1150   MCV 86.9 03/07/2015 1150   MCH 29.1 03/07/2015 1150   MCHC 33.4 03/07/2015 1150   RDW 15.0 03/07/2015 1150   LYMPHSABS 2.2 10/14/2014 1232   MONOABS 0.8 10/14/2014 1232   EOSABS 0.1 10/14/2014 1232   BASOSABS 0.0 10/14/2014 1232   CMP     Component Value Date/Time   NA 135 03/07/2015 1150   K 4.3 03/07/2015 1150   CL 105 03/07/2015 1150   CO2 22 03/07/2015 1150   GLUCOSE 73 03/07/2015 1150   BUN 13 03/07/2015 1150   CREATININE 0.79 03/07/2015 1150   CALCIUM 8.3* 03/07/2015 1150   PROT 5.5* 03/07/2015 1150   ALBUMIN 2.6* 03/07/2015 1150   AST 34 03/07/2015 1150   ALT 33 03/07/2015 1150   ALKPHOS 118* 03/07/2015 1150   BILITOT 0.4 03/07/2015 1150   GFRNONAA >90  10/14/2014 1232   GFRAA >90 10/14/2014 1232    A/P:  1) Single IUP at 36w 0d  2) Preeclampsia without severe features - Based on labile blood pressures and elevated urine protein/creatinine ratio, the patient meets criteria for preeclampsia without severe features.  Concur with admission and course of betamethasone (based on new ALPS criteria and new guidance from [facility] / [facility]COG).    Recommendations: 1) Would start a 24-hr urine protein collection if not already ordered. 2) Ultrasound for fetal growth (has not had a recent ultrasound) 3) Continue inpatient observation for now.  Would recommend preeclampsia labs 2-3x / week and at least daily NSTs while hospitalized. 4) Betamethasone for fetal lung maturity (see comments above) 5) If patient develops severe features (SBP > 160 or DBP >110; LFTx > 2x nl, platelets < 100K, creatinine > 1.2 mg/dl, severe headaches, or non-reassuring fetal testing), would begin magnesium sulfate prophylaxis and move toward delivery.  If otherwise stable, would recommend delivery at 37 weeks.   Thank you for the opportunity to be a part of the care of Laurie Phillips. Please contact our office if we can be of further assistance.   I spent approximately 30 minutes with this patient with over 50% of time spent in face-to-face counseling.  Benjaman Lobe, MD Maternal Fetal Medicine

## 2015-03-07 NOTE — H&P (Signed)
Laurie Phillips is a 24 y.o. female, G2 P0 at 36.0 weeks sent from the office for abn [facility] labs to be admitted to [facility] for further eval  Patient Active Problem List   Diagnosis Date Noted  . Normal fetus, antepartum 03/07/2015  . Bradycardia 10/19/2014  . First degree AV block 10/19/2014  . Chest pain 10/14/2014  . Bipolar disorder 10/14/2014  . Generalized anxiety disorder 10/14/2014  . Rh negative, antepartum 10/14/2014  . Fibroid x 2, small 10/14/2014  . ADHD (attention deficit hyperactivity disorder) 10/14/2014    Pregnancy Course: Patient entered care at 12.1 weeks.   EDC of 04/04/15 was established by [place].      [place] evaluations:   12.4 weeks - anteverted ut. normal fluid. Two suberosal fibroids. Fib one: L ant. fundus: 3.9 cm x 2.9 cm x 3.7 cm. Fib two: R ant: 1.3 cm x 1.2 cm.  CRL is c/w LMP GA., NT = 1.6 cm, NT 1.6 cm   19.6 weeks - Anatomy:Breech. Posterior placenta. No previa. Placenta edge to cx = 8.4 cm. Placental cord insertion seen. Normal fluid AP pocket = 3.6 cm. Measurements c/w LMP GA. Cx closed. Female gender. Anatomy compete.       Significant prenatal events:   Rh negative   Last evaluation:   35.6 weeks   VE:1/50/-2 on 03/06/15  Reason for admission:  ABN PIH labs  Pt States:   Contractions Frequency: none         Contraction severity: none         Fetal activity: +FM  OB History    Gravida Para Term Preterm AB TAB SAB Ectopic Multiple Living   2 0 0 0 1 0 1 0 0 0      Past Medical History  Diagnosis Date  . Headache   . Fibroid   . Sinus bradycardia   . Chest pain   . First degree AV block   . Bipolar 1 disorder   . Generalized anxiety disorder   . Rh negative, antepartum   . Fibroid   . Attention deficit hyperactivity disorder    Past Surgical History  Procedure Laterality Date  . Tonsillectomy    . Knee arthroscopy Right    Family History: She was adopted. Family history is unknown by patient. Social History:  reports that she has never smoked.  She has never used smokeless tobacco. She reports that she does not drink alcohol or use illicit drugs.   Prenatal Transfer Tool  Maternal Diabetes: No Genetic Screening: Normal Maternal Ultrasounds/Referrals: Normal Fetal Ultrasounds or other Referrals:  None Maternal Substance Abuse:  No Significant Maternal Medications:  None Significant Maternal Lab Results: Lab values include: Rh negative   ROS:  See HPI above, all other systems are negative  No Known Allergies    Blood pressure 147/80, pulse 41, temperature 98.5 F (36.9 C), temperature source Oral, resp. rate 20, height 5' 2.5" (1.588 m), last menstrual period 06/28/2014, SpO2 98 %.  Maternal Exam:  Uterine Assessment: Contraction frequency is rare.  Abdomen: Gravid, non tender. Fundal height is aga.  Normal external genitalia, vulva, cervix, uterus and adnexa.  No lesions noted on exam.  Pelvis adequate for delivery.  Fetal presentation: Vertex by Leopold's   Fetal Exam:  Monitor Surveillance : Continuous Monitoring   Mode: Ultrasound.  NICHD: Category CTXs: Q none EFW   6 lbs  Physical Exam: Nursing note and vitals reviewed General: alert and cooperative She appears well nourished Psychiatric: Normal mood and affect.  Her behavior is normal Head: Normocephalic Eyes: Pupils are equal, round, and reactive to light Neck: Normal range of motion Cardiovascular: RRR without murmur  Respiratory: CTAB. Effort normal  Abd: soft, non-tender, +BS, no rebound, no guarding  Genitourinary: Vagina normal  Neurological: A&Ox3 Skin: Warm and dry  Musculoskeletal: Normal range of motion  Homan's sign negative bilaterally No evidence of DVTs.  Edema: 1+bilaterally non-pitting edema DTR: 2+ Clonus: None   Prenatal labs: ABO, Rh: --/--/B NEG Performed at [facility] (05/15 1805) Antibody: NEG Performed at [facility] (05/15 1718) Rubella:   immune RPR:   NR HBsAg:   negative HIV: NONREACTIVE (09/26  1705)  GBS:  pending Sickle cell/Hgb electrophoresis:  WNL Pap:  wnl 09/11/14 GC:   negative Chlamydia: negative Genetic screenings:  wnl Glucola:  wnl  Assessment:  IUP at 36.0 weeks NICHD: Category Membranes: intact GBS negative   Plan:  Admit to Antepartum unit Routing [facility] orders IV pain medication per orders Prophylaxis abx if necessary MFM consult Diet: regular    Burma Ketcher, CNM, MSN 03/07/2015, 12:29 PM       All information will be confirmed upon admisson

## 2015-03-08 ENCOUNTER — Encounter (HOSPITAL_COMMUNITY)
Admission: AD | Disposition: A | Discharge status: Home / Self Care | Payer: Self-pay | Source: Ambulatory Visit | Attending: Obstetrics and Gynecology

## 2015-03-08 ENCOUNTER — Telehealth: Payer: Self-pay | Admitting: Cardiovascular Disease

## 2015-03-08 ENCOUNTER — Encounter (HOSPITAL_COMMUNITY): Payer: Self-pay | Admitting: Obstetrics

## 2015-03-08 ENCOUNTER — Inpatient Hospital Stay (HOSPITAL_COMMUNITY): Payer: Medicaid Other | Admitting: Anesthesiology

## 2015-03-08 DIAGNOSIS — Z9889 Other specified postprocedural states: Secondary | ICD-10-CM

## 2015-03-08 DIAGNOSIS — Z98891 History of uterine scar from previous surgery: Secondary | ICD-10-CM

## 2015-03-08 DIAGNOSIS — O1490 Unspecified pre-eclampsia, unspecified trimester: Secondary | ICD-10-CM | POA: Diagnosis present

## 2015-03-08 LAB — CBC
HCT: 28 % — ABNORMAL LOW (ref 36.0–46.0)
HCT: 33.8 % — ABNORMAL LOW (ref 36.0–46.0)
Hemoglobin: 11.6 g/dL — ABNORMAL LOW (ref 12.0–15.0)
Hemoglobin: 9.3 g/dL — ABNORMAL LOW (ref 12.0–15.0)
MCH: 29.1 pg (ref 26.0–34.0)
MCH: 29.6 pg (ref 26.0–34.0)
MCHC: 33.2 g/dL (ref 30.0–36.0)
MCHC: 34.3 g/dL (ref 30.0–36.0)
MCV: 86.2 fL (ref 78.0–100.0)
MCV: 87.5 fL (ref 78.0–100.0)
Platelets: 202 10*3/uL (ref 150–400)
Platelets: 239 10*3/uL (ref 150–400)
RBC: 3.2 MIL/uL — AB (ref 3.87–5.11)
RBC: 3.92 MIL/uL (ref 3.87–5.11)
RDW: 15.1 % (ref 11.5–15.5)
RDW: 15.1 % (ref 11.5–15.5)
WBC: 19.1 10*3/uL — ABNORMAL HIGH (ref 4.0–10.5)
WBC: 24.2 10*3/uL — AB (ref 4.0–10.5)

## 2015-03-08 LAB — COMPREHENSIVE METABOLIC PANEL
ALBUMIN: 2.4 g/dL — AB (ref 3.5–5.2)
ALK PHOS: 92 U/L (ref 39–117)
ALT: 26 U/L (ref 0–35)
ALT: 32 U/L (ref 0–35)
AST: 27 U/L (ref 0–37)
AST: 33 U/L (ref 0–37)
Albumin: 2.5 g/dL — ABNORMAL LOW (ref 3.5–5.2)
Alkaline Phosphatase: 117 U/L (ref 39–117)
Anion gap: 11 (ref 5–15)
Anion gap: 9 (ref 5–15)
BUN: 19 mg/dL (ref 6–23)
BUN: 23 mg/dL (ref 6–23)
CHLORIDE: 104 mmol/L (ref 96–112)
CO2: 19 mmol/L (ref 19–32)
CO2: 20 mmol/L (ref 19–32)
Calcium: 7.1 mg/dL — ABNORMAL LOW (ref 8.4–10.5)
Calcium: 8.1 mg/dL — ABNORMAL LOW (ref 8.4–10.5)
Chloride: 104 mmol/L (ref 96–112)
Creatinine, Ser: 0.93 mg/dL (ref 0.50–1.10)
Creatinine, Ser: 0.98 mg/dL (ref 0.50–1.10)
GFR calc Af Amer: >90 mL/min (ref >90)
GFR calc non Af Amer: 86 mL/min — ABNORMAL LOW (ref >90)
GFR, EST NON AFRICAN AMERICAN: 81 mL/min — AB (ref >90)
Glucose, Bld: 123 mg/dL — ABNORMAL HIGH (ref 70–99)
Glucose, Bld: 135 mg/dL — ABNORMAL HIGH (ref 70–99)
POTASSIUM: 5.3 mmol/L — AB (ref 3.5–5.1)
Potassium: 4.4 mmol/L (ref 3.5–5.1)
SODIUM: 133 mmol/L — AB (ref 135–145)
Sodium: 134 mmol/L — ABNORMAL LOW (ref 135–145)
TOTAL PROTEIN: 4.9 g/dL — AB (ref 6.0–8.3)
Total Bilirubin: 0.2 mg/dL — ABNORMAL LOW (ref 0.3–1.2)
Total Bilirubin: 0.2 mg/dL — ABNORMAL LOW (ref 0.3–1.2)
Total Protein: 5.4 g/dL — ABNORMAL LOW (ref 6.0–8.3)

## 2015-03-08 LAB — RPR: RPR Ser Ql: NONREACTIVE

## 2015-03-08 LAB — URIC ACID
Uric Acid, Serum: 8.1 mg/dL — ABNORMAL HIGH (ref 2.4–7.0)
Uric Acid, Serum: 8.3 mg/dL — ABNORMAL HIGH (ref 2.4–7.0)

## 2015-03-08 LAB — LACTATE DEHYDROGENASE
LDH: 153 U/L (ref 94–250)
LDH: 159 U/L (ref 94–250)

## 2015-03-08 LAB — MAGNESIUM
Magnesium: 4.8 mg/dL — ABNORMAL HIGH (ref 1.5–2.5)
Magnesium: 6.7 mg/dL (ref 1.5–2.5)

## 2015-03-08 SURGERY — Surgical Case
Anesthesia: Spinal

## 2015-03-08 MED ORDER — LACTATED RINGERS IV SOLN: 40.0000 [IU] | INTRAVENOUS | Status: DC | PRN

## 2015-03-08 MED ORDER — CEFAZOLIN SODIUM-DEXTROSE 2-3 GM-% IV SOLR
2.0000 g | Freq: Once | INTRAVENOUS | Status: AC
  Administered 2015-03-08: 2 g via INTRAVENOUS
  Filled 2015-03-08: qty 50

## 2015-03-08 MED ORDER — MEPERIDINE HCL 25 MG/ML IJ SOLN: 6.2500 mg | INTRAMUSCULAR | Status: DC | PRN

## 2015-03-08 MED ORDER — SIMETHICONE 80 MG PO CHEW: 80.0000 mg | CHEWABLE_TABLET | ORAL | Status: DC | PRN

## 2015-03-08 MED ORDER — SCOPOLAMINE 1 MG/3DAYS TD PT72
MEDICATED_PATCH | TRANSDERMAL | Status: DC | PRN
  Administered 2015-03-08: 1 via TRANSDERMAL

## 2015-03-08 MED ORDER — LACTATED RINGERS IV SOLN
40.0000 [IU] | INTRAVENOUS | Status: DC | PRN
  Administered 2015-03-08: 40 [IU] via INTRAVENOUS

## 2015-03-08 MED ORDER — KETOROLAC TROMETHAMINE 30 MG/ML IJ SOLN
30.0000 mg | Freq: Four times a day (QID) | INTRAMUSCULAR | Status: AC | PRN
  Administered 2015-03-08: 30 mg via INTRAVENOUS

## 2015-03-08 MED ORDER — SIMETHICONE 80 MG PO CHEW
80.0000 mg | CHEWABLE_TABLET | ORAL | Status: DC
  Administered 2015-03-08 – 2015-03-13 (×5): 80 mg via ORAL
  Filled 2015-03-08 (×5): qty 1

## 2015-03-08 MED ORDER — NALBUPHINE HCL 10 MG/ML IJ SOLN: 5.0000 mg | Freq: Once | INTRAMUSCULAR | Status: AC | PRN

## 2015-03-08 MED ORDER — MENTHOL 3 MG MT LOZG: 1.0000 | LOZENGE | OROMUCOSAL | Status: DC | PRN

## 2015-03-08 MED ORDER — ONDANSETRON HCL 4 MG/2ML IJ SOLN: 4.0000 mg | Freq: Three times a day (TID) | INTRAMUSCULAR | Status: DC | PRN

## 2015-03-08 MED ORDER — PHENYLEPHRINE 8 MG IN D5W 100 ML (0.08MG/ML) PREMIX OPTIME
INJECTION | INTRAVENOUS | Status: AC
  Filled 2015-03-08: qty 100

## 2015-03-08 MED ORDER — DIPHENHYDRAMINE HCL 50 MG/ML IJ SOLN: 12.5000 mg | INTRAMUSCULAR | Status: DC | PRN

## 2015-03-08 MED ORDER — OXYTOCIN 40 UNITS IN LACTATED RINGERS INFUSION - SIMPLE MED: 62.5000 mL/h | INTRAVENOUS | Status: AC

## 2015-03-08 MED ORDER — LANOLIN HYDROUS EX OINT: 1.0000 "application " | TOPICAL_OINTMENT | CUTANEOUS | Status: DC | PRN

## 2015-03-08 MED ORDER — KETOROLAC TROMETHAMINE 30 MG/ML IJ SOLN
INTRAMUSCULAR | Status: AC
  Administered 2015-03-08: 30 mg via INTRAVENOUS
  Filled 2015-03-08: qty 1

## 2015-03-08 MED ORDER — NALBUPHINE HCL 10 MG/ML IJ SOLN
INTRAMUSCULAR | Status: AC
  Filled 2015-03-08: qty 1

## 2015-03-08 MED ORDER — BUPIVACAINE IN DEXTROSE 0.75-8.25 % IT SOLN
INTRATHECAL | Status: DC | PRN
  Administered 2015-03-08: 1.4 mL via INTRATHECAL

## 2015-03-08 MED ORDER — ZOLPIDEM TARTRATE 5 MG PO TABS
5.0000 mg | ORAL_TABLET | Freq: Every evening | ORAL | Status: DC | PRN
  Administered 2015-03-08 – 2015-03-10 (×3): 5 mg via ORAL
  Filled 2015-03-08 (×3): qty 1

## 2015-03-08 MED ORDER — OXYTOCIN 10 UNIT/ML IJ SOLN
INTRAMUSCULAR | Status: AC
  Filled 2015-03-08: qty 4

## 2015-03-08 MED ORDER — SIMETHICONE 80 MG PO CHEW
80.0000 mg | CHEWABLE_TABLET | Freq: Three times a day (TID) | ORAL | Status: DC
  Administered 2015-03-08 – 2015-03-13 (×16): 80 mg via ORAL
  Filled 2015-03-08 (×16): qty 1

## 2015-03-08 MED ORDER — TERBUTALINE SULFATE 1 MG/ML IJ SOLN
0.2500 mg | Freq: Once | INTRAMUSCULAR | Status: AC | PRN
  Administered 2015-03-08: 0.25 mg via SUBCUTANEOUS
  Filled 2015-03-08: qty 1

## 2015-03-08 MED ORDER — MORPHINE SULFATE 0.5 MG/ML IJ SOLN
INTRAMUSCULAR | Status: AC
  Filled 2015-03-08: qty 10

## 2015-03-08 MED ORDER — ACETAMINOPHEN 325 MG PO TABS
650.0000 mg | ORAL_TABLET | ORAL | Status: DC | PRN
  Administered 2015-03-09: 650 mg via ORAL
  Filled 2015-03-08: qty 2

## 2015-03-08 MED ORDER — LACTATED RINGERS IV SOLN
INTRAVENOUS | Status: DC
  Administered 2015-03-08 – 2015-03-09 (×4): via INTRAVENOUS

## 2015-03-08 MED ORDER — NALOXONE HCL 0.4 MG/ML IJ SOLN: 0.4000 mg | INTRAMUSCULAR | Status: DC | PRN

## 2015-03-08 MED ORDER — MAGNESIUM SULFATE 40 G IN LACTATED RINGERS - SIMPLE
INTRAVENOUS | Status: DC | PRN
  Administered 2015-03-08: 2 g/h via INTRAVENOUS

## 2015-03-08 MED ORDER — SCOPOLAMINE 1 MG/3DAYS TD PT72
MEDICATED_PATCH | TRANSDERMAL | Status: AC
  Filled 2015-03-08: qty 1

## 2015-03-08 MED ORDER — FENTANYL CITRATE (PF) 100 MCG/2ML IJ SOLN
INTRAMUSCULAR | Status: AC
  Administered 2015-03-08: 50 ug via INTRAVENOUS
  Filled 2015-03-08: qty 2

## 2015-03-08 MED ORDER — PROPOFOL 10 MG/ML IV BOLUS
INTRAVENOUS | Status: AC
  Filled 2015-03-08: qty 20

## 2015-03-08 MED ORDER — GLYCOPYRROLATE 0.2 MG/ML IJ SOLN
INTRAMUSCULAR | Status: DC | PRN
  Administered 2015-03-08: 0.2 mg via INTRAVENOUS

## 2015-03-08 MED ORDER — WITCH HAZEL-GLYCERIN EX PADS: 1.0000 "application " | MEDICATED_PAD | CUTANEOUS | Status: DC | PRN

## 2015-03-08 MED ORDER — ATROPINE SULFATE 0.4 MG/ML IJ SOLN
INTRAMUSCULAR | Status: DC | PRN
  Administered 2015-03-08: 0.4 mg via INTRAVENOUS

## 2015-03-08 MED ORDER — NALOXONE HCL 1 MG/ML IJ SOLN
1.0000 ug/kg/h | INTRAVENOUS | Status: DC | PRN
  Filled 2015-03-08: qty 2

## 2015-03-08 MED ORDER — NALBUPHINE HCL 10 MG/ML IJ SOLN: 5.0000 mg | INTRAMUSCULAR | Status: DC | PRN

## 2015-03-08 MED ORDER — PRENATAL MULTIVITAMIN CH
1.0000 | ORAL_TABLET | Freq: Every day | ORAL | Status: DC
  Administered 2015-03-08 – 2015-03-13 (×6): 1 via ORAL
  Filled 2015-03-08 (×6): qty 1

## 2015-03-08 MED ORDER — LACTATED RINGERS IV SOLN: INTRAVENOUS | Status: DC

## 2015-03-08 MED ORDER — DIBUCAINE 1 % RE OINT: 1.0000 "application " | TOPICAL_OINTMENT | RECTAL | Status: DC | PRN

## 2015-03-08 MED ORDER — OXYTOCIN 40 UNITS IN LACTATED RINGERS INFUSION - SIMPLE MED
1.0000 m[IU]/min | INTRAVENOUS | Status: DC
  Administered 2015-03-08: 1 m[IU]/min via INTRAVENOUS

## 2015-03-08 MED ORDER — KETOROLAC TROMETHAMINE 30 MG/ML IJ SOLN: 30.0000 mg | Freq: Four times a day (QID) | INTRAMUSCULAR | Status: AC | PRN

## 2015-03-08 MED ORDER — NALBUPHINE HCL 10 MG/ML IJ SOLN
5.0000 mg | INTRAMUSCULAR | Status: DC | PRN
  Administered 2015-03-08: 5 mg via SUBCUTANEOUS

## 2015-03-08 MED ORDER — PROMETHAZINE HCL 25 MG/ML IJ SOLN: 6.2500 mg | INTRAMUSCULAR | Status: DC | PRN

## 2015-03-08 MED ORDER — LACTATED RINGERS IV BOLUS (SEPSIS)
300.0000 mL | Freq: Once | INTRAVENOUS | Status: AC
  Administered 2015-03-08: 300 mL via INTRAVENOUS

## 2015-03-08 MED ORDER — PHENYLEPHRINE 8 MG IN D5W 100 ML (0.08MG/ML) PREMIX OPTIME
INJECTION | INTRAVENOUS | Status: DC | PRN
  Administered 2015-03-08: 30 ug/min via INTRAVENOUS

## 2015-03-08 MED ORDER — DEXAMETHASONE SODIUM PHOSPHATE 4 MG/ML IJ SOLN
INTRAMUSCULAR | Status: AC
Start: 2015-03-08 — End: 2015-03-08
  Filled 2015-03-08: qty 1

## 2015-03-08 MED ORDER — ONDANSETRON HCL 4 MG/2ML IJ SOLN
INTRAMUSCULAR | Status: AC
  Filled 2015-03-08: qty 2

## 2015-03-08 MED ORDER — OXYTOCIN 40 UNITS IN LACTATED RINGERS INFUSION - SIMPLE MED: 62.5000 mL/h | INTRAVENOUS | Status: DC

## 2015-03-08 MED ORDER — ACETAMINOPHEN 500 MG PO TABS
1000.0000 mg | ORAL_TABLET | Freq: Four times a day (QID) | ORAL | Status: AC
  Administered 2015-03-08 (×3): 1000 mg via ORAL
  Filled 2015-03-08 (×3): qty 2

## 2015-03-08 MED ORDER — FENTANYL CITRATE (PF) 100 MCG/2ML IJ SOLN
INTRAMUSCULAR | Status: AC
  Filled 2015-03-08: qty 2

## 2015-03-08 MED ORDER — IBUPROFEN 600 MG PO TABS
600.0000 mg | ORAL_TABLET | Freq: Four times a day (QID) | ORAL | Status: DC
Start: 2015-03-08 — End: 2015-03-13
  Administered 2015-03-08 – 2015-03-13 (×19): 600 mg via ORAL
  Filled 2015-03-08 (×19): qty 1

## 2015-03-08 MED ORDER — DIPHENHYDRAMINE HCL 25 MG PO CAPS: 25.0000 mg | ORAL_CAPSULE | Freq: Four times a day (QID) | ORAL | Status: DC | PRN

## 2015-03-08 MED ORDER — SODIUM CHLORIDE 0.9 % IJ SOLN: 3.0000 mL | INTRAMUSCULAR | Status: DC | PRN

## 2015-03-08 MED ORDER — FENTANYL CITRATE (PF) 100 MCG/2ML IJ SOLN
INTRAMUSCULAR | Status: DC | PRN
  Administered 2015-03-08: 20 ug via INTRAVENOUS

## 2015-03-08 MED ORDER — DIPHENHYDRAMINE HCL 25 MG PO CAPS
25.0000 mg | ORAL_CAPSULE | ORAL | Status: DC | PRN
  Filled 2015-03-08: qty 1

## 2015-03-08 MED ORDER — SENNOSIDES-DOCUSATE SODIUM 8.6-50 MG PO TABS
2.0000 | ORAL_TABLET | ORAL | Status: DC
Start: 2015-03-09 — End: 2015-03-13
  Administered 2015-03-08 – 2015-03-13 (×5): 2 via ORAL
  Filled 2015-03-08 (×5): qty 2

## 2015-03-08 MED ORDER — OXYCODONE-ACETAMINOPHEN 5-325 MG PO TABS: 2.0000 | ORAL_TABLET | ORAL | Status: DC | PRN

## 2015-03-08 MED ORDER — FENTANYL CITRATE (PF) 100 MCG/2ML IJ SOLN
25.0000 ug | INTRAMUSCULAR | Status: DC | PRN
  Administered 2015-03-08: 100 ug via INTRAVENOUS
  Administered 2015-03-08: 50 ug via INTRAVENOUS

## 2015-03-08 MED ORDER — OXYCODONE-ACETAMINOPHEN 5-325 MG PO TABS
1.0000 | ORAL_TABLET | ORAL | Status: DC | PRN
  Administered 2015-03-09 (×2): 1 via ORAL
  Filled 2015-03-08 (×2): qty 1

## 2015-03-08 MED ORDER — MORPHINE SULFATE (PF) 0.5 MG/ML IJ SOLN
INTRAMUSCULAR | Status: DC | PRN
  Administered 2015-03-08: .2 mg via EPIDURAL

## 2015-03-08 MED ORDER — SCOPOLAMINE 1 MG/3DAYS TD PT72: 1.0000 | MEDICATED_PATCH | Freq: Once | TRANSDERMAL | Status: DC

## 2015-03-08 MED ORDER — ATROPINE SULFATE 0.4 MG/ML IJ SOLN
INTRAMUSCULAR | Status: AC
  Filled 2015-03-08: qty 1

## 2015-03-08 MED ORDER — TETANUS-DIPHTH-ACELL PERTUSSIS 5-2.5-18.5 LF-MCG/0.5 IM SUSP
0.5000 mL | Freq: Once | INTRAMUSCULAR | Status: DC
  Filled 2015-03-08: qty 0.5

## 2015-03-08 MED ORDER — DEXAMETHASONE SODIUM PHOSPHATE 10 MG/ML IJ SOLN
INTRAMUSCULAR | Status: DC | PRN
  Administered 2015-03-08: 4 mg via INTRAVENOUS

## 2015-03-08 SURGICAL SUPPLY — 34 items
BENZOIN TINCTURE PRP APPL 2/3 (GAUZE/BANDAGES/DRESSINGS) ×3 IMPLANT
CLAMP CORD UMBIL (MISCELLANEOUS) IMPLANT
CLOSURE STERI STRIP 1/2 X4 (GAUZE/BANDAGES/DRESSINGS) ×3 IMPLANT
CLOTH BEACON ORANGE TIMEOUT ST (SAFETY) ×3 IMPLANT
CONTAINER PREFILL 10% NBF 15ML (MISCELLANEOUS) IMPLANT
DERMABOND ADHESIVE PROPEN (GAUZE/BANDAGES/DRESSINGS) ×2
DERMABOND ADVANCED .7 DNX6 (GAUZE/BANDAGES/DRESSINGS) ×1 IMPLANT
DRAPE SHEET LG 3/4 BI-LAMINATE (DRAPES) IMPLANT
DRSG OPSITE POSTOP 4X10 (GAUZE/BANDAGES/DRESSINGS) ×3 IMPLANT
DURAPREP 26ML APPLICATOR (WOUND CARE) ×3 IMPLANT
ELECT REM PT RETURN 9FT ADLT (ELECTROSURGICAL) ×3
ELECTRODE REM PT RTRN 9FT ADLT (ELECTROSURGICAL) ×1 IMPLANT
EXTRACTOR VACUUM M CUP 4 TUBE (SUCTIONS) IMPLANT
EXTRACTOR VACUUM M CUP 4' TUBE (SUCTIONS)
GLOVE BIO SURGEON STRL SZ7.5 (GLOVE) ×3 IMPLANT
GLOVE BIOGEL PI IND STRL 7.5 (GLOVE) ×1 IMPLANT
GLOVE BIOGEL PI INDICATOR 7.5 (GLOVE) ×2
GOWN STRL REUS W/TWL LRG LVL3 (GOWN DISPOSABLE) ×6 IMPLANT
KIT ABG SYR 3ML LUER SLIP (SYRINGE) IMPLANT
NEEDLE HYPO 25X5/8 SAFETYGLIDE (NEEDLE) IMPLANT
NS IRRIG 1000ML POUR BTL (IV SOLUTION) ×3 IMPLANT
PACK C SECTION WH (CUSTOM PROCEDURE TRAY) ×3 IMPLANT
PAD OB MATERNITY 4.3X12.25 (PERSONAL CARE ITEMS) ×3 IMPLANT
RTRCTR C-SECT PINK 25CM LRG (MISCELLANEOUS) ×3 IMPLANT
STRIP CLOSURE SKIN 1/2X4 (GAUZE/BANDAGES/DRESSINGS) ×2 IMPLANT
SUT CHROMIC 2 0 CT 1 (SUTURE) ×3 IMPLANT
SUT MNCRL AB 3-0 PS2 27 (SUTURE) ×3 IMPLANT
SUT PLAIN 0 NONE (SUTURE) IMPLANT
SUT PLAIN 2 0 XLH (SUTURE) ×3 IMPLANT
SUT VIC AB 0 CT1 36 (SUTURE) ×3 IMPLANT
SUT VIC AB 0 CTX 36 (SUTURE) ×6
SUT VIC AB 0 CTX36XBRD ANBCTRL (SUTURE) ×3 IMPLANT
TOWEL OR 17X24 6PK STRL BLUE (TOWEL DISPOSABLE) ×3 IMPLANT
TRAY FOLEY CATH SILVER 14FR (SET/KITS/TRAYS/PACK) ×3 IMPLANT

## 2015-03-08 NOTE — Transfer of Care (Signed)
Immediate Anesthesia Transfer of Care Note  Patient: Laurie Phillips  Procedure(s) Performed: Procedure(s): CESAREAN SECTION (N/A)  Patient Location: PACU  Anesthesia Type:Spinal  Level of Consciousness: awake, alert  and oriented  Airway & Oxygen Therapy: Patient Spontanous Breathing  Post-op Assessment: Report given to RN and Post -op Vital signs reviewed and stable  Post vital signs: Reviewed and stable  Last Vitals:  Filed Vitals:   03/08/15 0350  BP: 152/77  Pulse: 52  Temp:   Resp:     Complications: No apparent anesthesia complications

## 2015-03-08 NOTE — Progress Notes (Signed)
Laurie Phillips MRN: [id number]  Subjective: -Nurse call to report late decelerations approximately two hours ago, but none currently.  In room to assess. Patient resting in bed.  Reports dull headache, but denies other PIH symptoms.  Sister at bedside.   Objective: BP 117/52 mmHg  Pulse 36  Temp(Src) 98.4 F (36.9 C) (Axillary)  Resp 18  Ht 5' 2.5" (1.588 m)  Wt 86.183 kg (190 lb)  BMI 34.18 kg/m2  SpO2 99%  LMP 06/28/2014 I/O last 3 completed shifts: In: 492.1 [P.O.:240; I.V.:252.1] Out: 0  Total I/O In: 1050 [P.O.:250; I.V.:800] Out: 525 [Urine:525] FHT: 115 bpm, Mod Var, -Decels, +Accels UC:   None graphed or palpated SVE:   Dilation: 1 Effacement (%): 70 Exam by:: Kavitha Lansdale CNM Membranes: Intact Pitocin: Initiated  Results for orders placed or performed during the hospital encounter of 03/07/15 (from the past 24 hour(s))  CBC     Status: Abnormal   Collection Time: 03/07/15 11:50 AM  Result Value Ref Range   WBC 11.3 (H) 4.0 - 10.5 K/uL   RBC 3.82 (L) 3.87 - 5.11 MIL/uL   Hemoglobin 11.1 (L) 12.0 - 15.0 g/dL   HCT 33.2 (L) 36.0 - 46.0 %   MCV 86.9 78.0 - 100.0 fL   MCH 29.1 26.0 - 34.0 pg   MCHC 33.4 30.0 - 36.0 g/dL   RDW 15.0 11.5 - 15.5 %   Platelets 229 150 - 400 K/uL  Comprehensive metabolic panel     Status: Abnormal   Collection Time: 03/07/15 11:50 AM  Result Value Ref Range   Sodium 135 135 - 145 mmol/L   Potassium 4.3 3.5 - 5.1 mmol/L   Chloride 105 96 - 112 mmol/L   CO2 22 19 - 32 mmol/L   Glucose, Bld 73 70 - 99 mg/dL   BUN 13 6 - 23 mg/dL   Creatinine, Ser 0.79 0.50 - 1.10 mg/dL   Calcium 8.3 (L) 8.4 - 10.5 mg/dL   Total Protein 5.5 (L) 6.0 - 8.3 g/dL   Albumin 2.6 (L) 3.5 - 5.2 g/dL   AST 34 0 - 37 U/L   ALT 33 0 - 35 U/L   Alkaline Phosphatase 118 (H) 39 - 117 U/L   Total Bilirubin 0.4 0.3 - 1.2 mg/dL   Anion gap 8.0 5 - 15  Uric acid     Status: Abnormal   Collection Time: 03/07/15 11:50 AM  Result Value Ref Range   Uric Acid, Serum  7.3 (H) 2.4 - 7.0 mg/dL  Lactate dehydrogenase     Status: None   Collection Time: 03/07/15 11:50 AM  Result Value Ref Range   LDH 153 94 - 250 U/L  Type and screen     Status: None (Preliminary result)   Collection Time: 03/07/15 11:50 AM  Result Value Ref Range   ABO/RH(D) B NEG    Antibody Screen POS    Sample Expiration 03/10/2015    Antibody Identification PASSIVELY ACQUIRED ANTI-D    DAT, IgG NEG    Unit Number [id number]    Blood Component Type RED CELLS,LR    Unit division 00    Status of Unit ALLOCATED    Transfusion Status OK TO TRANSFUSE    Crossmatch Result COMPATIBLE    Unit Number [id number]    Blood Component Type RED CELLS,LR    Unit division 00    Status of Unit ALLOCATED    Transfusion Status OK TO TRANSFUSE    Crossmatch Result COMPATIBLE   Protein /  creatinine ratio, urine     Status: Abnormal   Collection Time: 03/07/15 12:20 PM  Result Value Ref Range   Creatinine, Urine 46.00 mg/dL   Total Protein, Urine 283 mg/dL   Protein Creatinine Ratio 6.15 (H) 0.00 - 0.15  Group B strep by PCR     Status: None   Collection Time: 03/07/15  5:31 PM  Result Value Ref Range   Group B strep by PCR NEGATIVE NEGATIVE  Magnesium     Status: Abnormal   Collection Time: 03/08/15 12:09 AM  Result Value Ref Range   Magnesium 4.8 (H) 1.5 - 2.5 mg/dL  CBC     Status: Abnormal   Collection Time: 03/08/15 12:09 AM  Result Value Ref Range   WBC 19.1 (H) 4.0 - 10.5 K/uL   RBC 3.92 3.87 - 5.11 MIL/uL   Hemoglobin 11.6 (L) 12.0 - 15.0 g/dL   HCT 33.8 (L) 36.0 - 46.0 %   MCV 86.2 78.0 - 100.0 fL   MCH 29.6 26.0 - 34.0 pg   MCHC 34.3 30.0 - 36.0 g/dL   RDW 15.1 11.5 - 15.5 %   Platelets 239 150 - 400 K/uL  Comprehensive metabolic panel     Status: Abnormal   Collection Time: 03/08/15 12:09 AM  Result Value Ref Range   Sodium 134 (L) 135 - 145 mmol/L   Potassium 4.4 3.5 - 5.1 mmol/L   Chloride 104 96 - 112 mmol/L   CO2 19 19 - 32 mmol/L   Glucose, Bld 135 (H) 70 -  99 mg/dL   BUN 19 6 - 23 mg/dL   Creatinine, Ser 0.93 0.50 - 1.10 mg/dL   Calcium 8.1 (L) 8.4 - 10.5 mg/dL   Total Protein 5.4 (L) 6.0 - 8.3 g/dL   Albumin 2.5 (L) 3.5 - 5.2 g/dL   AST 33 0 - 37 U/L   ALT 32 0 - 35 U/L   Alkaline Phosphatase 117 39 - 117 U/L   Total Bilirubin 0.2 (L) 0.3 - 1.2 mg/dL   GFR calc non Af Amer 86 (L) >90 mL/min   GFR calc Af Amer >90 >90 mL/min   Anion gap 11 5 - 15  Lactate dehydrogenase     Status: None   Collection Time: 03/08/15 12:09 AM  Result Value Ref Range   LDH 153 94 - 250 U/L  Uric acid     Status: Abnormal   Collection Time: 03/08/15 12:09 AM  Result Value Ref Range   Uric Acid, Serum 8.1 (H) 2.4 - 7.0 mg/dL    Assessment:  IUP at 36.1wks Cat I FT  PIH Induction of Labor  Plan: -Discussed and reviewed strip with Dr. Octavio Manns who advises; start pitocin -Discussed change in POC with patient who verbalizes understanding -Questions and concerns addressed -Pitocin initiated -Continue other mgmt as ordered  [name],MSN, CNM 03/08/2015, 1:34 AM

## 2015-03-08 NOTE — Anesthesia Postprocedure Evaluation (Signed)
  Anesthesia Post-op Note  Patient: Laurie Phillips  Procedure(s) Performed: Procedure(s): CESAREAN SECTION (N/A)  Patient Location: PACU  Anesthesia Type:Spinal  Level of Consciousness: awake, alert  and oriented  Airway and Oxygen Therapy: Patient Spontanous Breathing  Post-op Pain: none  Post-op Assessment: Post-op Vital signs reviewed, Respiratory Function Stable and Patent Airway  Post-op Vital Signs: Reviewed and stable  Last Vitals:  Filed Vitals:   03/08/15 0350  BP: 152/77  Pulse: 52  Temp:   Resp:     Complications: No apparent anesthesia complications

## 2015-03-08 NOTE — Addendum Note (Signed)
Addendum  created 03/08/15 1830 by Asher Muir, CRNA   Modules edited: Notes Section   Notes Section:  File: [id number]

## 2015-03-08 NOTE — Telephone Encounter (Signed)
EKG'S  RECEIVED REVIEWED  WITH SCOTT  WEAVER PAC  APPEARS PT  IS IN  COMPLETE HEART BLOCK. PAGED  TRISH WHO IN RETURNED CALLED   INFO  GIVEN  TO REPORT  TO HOSP  DOD  TO   CONSULT   PT .Adonis Housekeeper

## 2015-03-08 NOTE — Op Note (Addendum)
Cesarean Section Procedure Note  Indications: P0 at 36 wks induced for preeclampsia with severe features with fetal intolerance of labor remote from delivery  Pre-operative Diagnosis: fetal intolerance of labor    Post-operative Diagnosis: fetal intolerance of labor   Procedure: PRIMARY LOW TRANSVERSE CESAREAN SECTION  Surgeon: Everett Graff, MD    Assistants: Gavin Pound, CNM  Anesthesia: Spinal  Anesthesiologist: Alexis Frock, MD   Procedure Details  The patient was taken to the operating room secondary to fetal intolerance of labor remote from delivery after the risks, benefits, complications, treatment options, and expected outcomes were discussed with the patient.  The patient concurred with the proposed plan, giving informed consent which was signed and witnessed. The patient was taken to Operating Room 1, identified as Laurie Phillips and the procedure verified as C-Section Delivery. A Time Out was held and the above information confirmed.  After induction of anesthesia by obtaining a spinal, the patient was prepped and draped in the usual sterile manner. A Pfannenstiel skin incision was made and carried down through the subcutaneous tissue to the underlying layer of fascia.  The fascia was incised bilaterally and extended transversely bilaterally with the Mayo scissors. Kocher clamps were placed on the inferior aspect of the fascial incision and the underlying rectus muscle was separated from the fascia. The same was done on the superior aspect of the fascial incision.  The peritoneum was identified, entered bluntly and extended manually.  An Alexis self-retaining retractor was placed.  The utero-vesical peritoneal reflection was incised transversely and the bladder flap was bluntly freed from the lower uterine segment. A low transverse uterine incision was made with the scalpel and extended bilaterally with the bandage scissors.  The infant was delivered in vertex position without  difficulty.  After the umbilical cord was clamped and cut, the infant was handed to the awaiting pediatricians.  Cord blood was obtained for evaluation.  The placenta was removed intact and appeared to be within normal limits. The uterus was cleared of all clots and debris. The uterine incision was closed with running interlocking sutures of 0 Vicryl and a second imbricating layer was performed as well.   Bilateral tubes and ovaries appeared to be within normal limits.  Good hemostasis was noted.  Copious irrigation was performed until clear.  The peritoneum was repaired with 2-0 chromic via a running suture.  The fascia was reapproximated with a running suture of 0 Vicryl. The subcutaneous tissue was reapproximated with 3 interrupted sutures of 2-0 plain.  The skin was reapproximated with a subcuticular suture of 3-0 monocryl.  Liquabond and Steristrips were applied.  Instrument, sponge, and needle counts were correct prior to abdominal closure and at the conclusion of the case.  The patient was awaiting transfer to the recovery room in good condition.  Findings: Live female infant with Apgars 9 at one minute and 9 at five minutes.  Normal appearing bilateral ovaries and fallopian tubes were noted. Approximately 4-5cm posterior subserosal/pedunculated fibroid and 1-2cm subserosal fibroid at fundus.  Estimated Blood Loss:  800 ml         Drains: foley to gravity 150 cc         Total IV Fluids: 1100 ml         Specimens to Pathology: Placenta         Complications:  None; patient tolerated the procedure well.         Disposition: PACU - hemodynamically stable.         Condition:  stable  Attending Attestation: I performed the procedure.

## 2015-03-08 NOTE — Anesthesia Postprocedure Evaluation (Signed)
Anesthesia Post Note  Patient: Laurie Phillips  Procedure(s) Performed: Procedure(s) (LRB): CESAREAN SECTION (N/A)  Anesthesia type: Spinal  Patient location: AICU  Post pain: Pain level controlled  Post assessment: Post-op Vital signs reviewed  Last Vitals:  Filed Vitals:   03/08/15 1800  BP: 112/53  Pulse: 35  Temp:   Resp: 17    Post vital signs: Reviewed, Yesterday pt was laboring in 1st degree heart block and asymptomatic.  In complete heat block today, yet asymptomatic. Cardiology consult done. Decision to DC magnesium. Pt stable  Level of consciousness: awake  Complications: No apparent anesthesia complications

## 2015-03-08 NOTE — Consult Note (Signed)
CARDIOLOGY CONSULT NOTE   Patient ID: Laurie Phillips MRN: [id number], DOB/AGE: 24/08/1991   Admit date: 03/07/2015 Date of Consult: 03/08/2015   Primary Physician: Abigail Miyamoto, MD Primary Cardiologist: Dr. Jenkins Rouge  Pt. Profile  24 year old woman with past history of first-degree AV block.  History of preeclampsia.  Now postpartum has complete heart block.  Problem List  Past Medical History  Diagnosis Date  . Headache   . Fibroid   . Sinus bradycardia   . Chest pain   . First degree AV block   . Bipolar 1 disorder   . Generalized anxiety disorder   . Rh negative, antepartum   . Fibroid   . Attention deficit hyperactivity disorder   . Pneumonia     Past Surgical History  Procedure Laterality Date  . Tonsillectomy    . Knee arthroscopy Right      Allergies  No Known Allergies  HPI   This 24 year old woman has been in generally good health.  He has a past history of first-degree AV block.  She saw Dr. Johnsie Cancel on 10/18/14.  Workup at that time included echocardiogram which was normal.  Plans were to consider further testing after her pregnancy had concluded.  This may include an MRI to look for infiltrative myocardial disease. He does not give any history of chest discomfort or shortness of breath.  She does not have any history of dizziness or syncope.  She is adopted but is unaware of any history of requirement of pacemaker in the family history.  Inpatient Medications  . acetaminophen  1,000 mg Oral 4 times per day  . ibuprofen  600 mg Oral 4 times per day  . nalbuphine      . prenatal multivitamin  1 tablet Oral Q1200  . scopolamine  1 patch Transdermal Once  . [START ON 03/09/2015] senna-docusate  2 tablet Oral Q24H  . simethicone  80 mg Oral TID PC  . [START ON 03/09/2015] simethicone  80 mg Oral Q24H  . [START ON 03/09/2015] Tdap  0.5 mL Intramuscular Once    Family History Family History  Problem Relation Age of Onset  . Adopted: Yes  . Family  history unknown: Yes     Social History History   Social History  . Marital Status: Married    Spouse Name: N/A  . Number of Children: N/A  . Years of Education: N/A   Occupational History  . Not on file.   Social History Main Topics  . Smoking status: Never Smoker   . Smokeless tobacco: Never Used  . Alcohol Use: No  . Drug Use: No  . Sexual Activity: Yes    Birth Control/ Protection: None   Other Topics Concern  . Not on file   Social History Narrative     Review of Systems  General:  No chills, fever, night sweats or weight changes.  Cardiovascular:  No chest pain, dyspnea on exertion, edema, orthopnea, palpitations, paroxysmal nocturnal dyspnea. Dermatological: No rash, lesions/masses Respiratory: No cough, dyspnea Urologic: No hematuria, dysuria Abdominal:   No nausea, vomiting, diarrhea, bright red blood per rectum, melena, or hematemesis Neurologic:  No visual changes, wkns, changes in mental status. All other systems reviewed and are otherwise negative except as noted above.  Physical Exam  Blood pressure 104/50, pulse 33, temperature 97.4 F (36.3 C), temperature source Axillary, resp. rate 18, height 5' 2.5" (1.588 m), weight 189 lb 1.6 oz (85.775 kg), last menstrual period 06/28/2014, SpO2 98 %, unknown  if currently breastfeeding.  General: Pleasant, NAD Psych: Normal affect. Neuro: Alert and oriented X 3. Moves all extremities spontaneously. HEENT: Normal  Neck: Supple without bruits or JVD. Lungs:  Resp regular and unlabored, CTA. Heart: RRR no s3, s4, or murmurs. Abdomen: Soft, non-tender, non-distended, BS + x 4.  Extremities: No clubbing, cyanosis.  There is edema.[name] DP/PT/Radials 2+ and equal bilaterally.  Labs  No results for input(s): CKTOTAL, CKMB, TROPONINI in the last 72 hours. Lab Results  Component Value Date   WBC 24.2* 03/08/2015   HGB 9.3* 03/08/2015   HCT 28.0* 03/08/2015   MCV 87.5 03/08/2015   PLT 202 03/08/2015     Recent  Labs Lab 03/08/15 1215  NA 133*  K 5.3*  CL 104  CO2 20  BUN 23  CREATININE 0.98  CALCIUM 7.1*  PROT 4.9*  BILITOT 0.2*  ALKPHOS 92  ALT 26  AST 27  GLUCOSE 123*   No results found for: CHOL, HDL, LDLCALC, TRIG No results found for: DDIMER  Radiology/Studies  US Ob Comp + 14 Wk  03/08/2015   OBSTETRICAL ULTRASOUND: This exam was performed within a [place] Ultrasound Department. The OB US report was generated in the [facility] system, and faxed to the ordering physician.   This report is available in the [facility]. See the [facility] Obstetric US report via the Image Link.   ECG  Normal sinus rhythm with complete heart block.  Narrow QRS.  No ischemic changes.  ASSESSMENT AND PLAN  1.  New onset of complete heart block.  Prior history of first-degree AV block. 2.  Status post cesarean section earlier today 3.  Eclampsia with significant peripheral edema  Plan: At this point the patient is not in any hemodynamic distress from her complete heart block.  Discussed with her obstetrician.  We will stop her IV magnesium since magnesium can sometimes exacerbate heart block.  Continue to observe on telemetry. After discharge we will want her to follow back up with Dr. Johnsie Cancel. Signed, Darlin Coco, MD  03/08/2015, 2:29 PM

## 2015-03-08 NOTE — Progress Notes (Addendum)
Laurie Phillips MRN: [id number]  Subjective: -Strip reviewed by Dr. Octavio Manns.  Pitocin has been discontinued.  Patient reports contractions that are mild.    Objective: BP 124/75 mmHg  Pulse 38  Temp(Src) 98.2 F (36.8 C) (Oral)  Resp 18  Ht 5' 2.5" (1.588 m)  Wt 86.183 kg (190 lb)  BMI 34.18 kg/m2  SpO2 99%  LMP 06/28/2014 I/O last 3 completed shifts: In: 492.1 [P.O.:240; I.V.:252.1] Out: 0  Total I/O In: 1050 [P.O.:250; I.V.:800] Out: 525 [Urine:525] FHT:135 bpm, Mod Var, -Decels, +Accels UC:  Irregular, palpates mild to moderate  SVE:   Dilation: 1 Effacement (%): 70 Exam by:: Laurie Laurie Phillips Membranes: Intact Pitocin: None  Assessment:  IUP at 36.1wks Cat III FT  PIH  Mg SO4 Infusion  Plan: -Dr. Octavio Manns in room to discuss recommendation for primary c/s -R/B discussed including but not limited to damage to organs or fetus resulting in need for further surgery, pain, bleeding, and infection -Patient verbalizes understanding of these risks and would like to proceed with c/s -Terbutaline given  Laurie Phillips, Laurie LYNN,Laurie Phillips, Laurie Phillips 03/08/2015, 3:40 AM   Agree with above - AYR

## 2015-03-08 NOTE — Consult Note (Signed)
Neonatology Note:   Attendance at C-section:    I was asked by Dr. Mancel Bale to attend this primary C/S at 36 1/7 weeks due to [facility]. The mother is a G2P0A1 B neg, GBS neg with pre-eclampsia, on Magnesium sulfate since yesterday. She also got a single dose of Betamethasone yesterday. She was being induced, but baby has not tolerated Pitocin well. Mother has first degree heart block, ADHD, bipolar disorder, and anxiety. ROM at delivery, fluid clear. Infant vigorous with good tone, cried with stimulation. Needed only minimal bulb suctioning. Ap 9/9. Lungs clear to ausc in DR, no distress. I spoke with both parents about the risk for resp problems and hypoglycemia, but he appears well and is fine for skin to skin time, with his nurse observing closely. To CN to care of Pediatrician.   Real Cons, MD

## 2015-03-08 NOTE — Telephone Encounter (Signed)
DR Johnsie Cancel  SPOKE WITH  DR Charlesetta Garibaldi  [facility]  TO FAX  OVER  EKG ON PT TO BE REVIEWED./CY

## 2015-03-08 NOTE — Anesthesia Preprocedure Evaluation (Signed)
Anesthesia Evaluation  Patient identified by MRN, date of birth, ID band Patient awake and Patient confused    Reviewed: Allergy & Precautions, H&P , NPO status , Patient's Chart, lab work & pertinent test results  Airway Mallampati: II   Neck ROM: full    Dental   Pulmonary  breath sounds clear to auscultation  Pulmonary exam normal       Cardiovascular Exercise Tolerance: Good Rhythm:regular Rate:Bradycardia  1st degree AV block   Neuro/Psych    GI/Hepatic   Endo/Other    Renal/GU      Musculoskeletal   Abdominal   Peds  Hematology   Anesthesia Other Findings   Reproductive/Obstetrics (+) Pregnancy                             Anesthesia Physical Anesthesia Plan  ASA: II and emergent  Anesthesia Plan: Spinal   Post-op Pain Management:    Induction:   Airway Management Planned:   Additional Equipment:   Intra-op Plan:   Post-operative Plan:   Informed Consent: I have reviewed the patients History and Physical, chart, labs and discussed the procedure including the risks, benefits and alternatives for the proposed anesthesia with the patient or authorized representative who has indicated his/her understanding and acceptance.     Plan Discussed with:   Anesthesia Plan Comments:         Anesthesia Quick Evaluation

## 2015-03-08 NOTE — Telephone Encounter (Signed)
New message      Dr Charlesetta Garibaldi want to talk to Dr Johnsie Cancel.  Pt just had a baby and she want to talk to Dr Johnsie Cancel.  I asked her if she needed a consult---she said she did not know---she wanted to talk to Dr Johnsie Cancel.

## 2015-03-08 NOTE — Anesthesia Procedure Notes (Signed)
Spinal Patient location during procedure: OR Start time: 03/08/2015 4:01 AM End time: 03/08/2015 4:12 AM Staffing Anesthesiologist: Alexis Frock Performed by: anesthesiologist  Preanesthetic Checklist Completed: patient identified, site marked, surgical consent, pre-op evaluation, timeout performed, IV checked, risks and benefits discussed and monitors and equipment checked Spinal Block Patient position: sitting Prep: site prepped and draped and DuraPrep Patient monitoring: heart rate, cardiac monitor, continuous pulse ox and blood pressure Approach: midline Location: L2-3 Injection technique: single-shot Needle Needle type: Pencil-Tip  Needle gauge: 24 G Needle length: 5 cm Assessment Sensory level: T4 Additional Notes Spinal 24g, marcaine 1.4cc .75% with dextrose, Fent 14mcg, Morphine 237mcg.  No complications

## 2015-03-08 NOTE — Progress Notes (Addendum)
Laurie Phillips [id number]  Subjective: Postpartum Day 0: Primary C/S due to fetal intolerance to labor, remote from delivery Resting comfortably, very sleepy.  Baby to [facility] for mild grunting.  Denies chest pain, visual sx, epigastric pain, SOB, or HA. Feeding:  Pumping Contraceptive plan:  Undecided  Patient undecided regarding in or outpatient circumcision--husband will decide.  Patient understands insurance will not cover circumcision.  Objective: Temp:  [97.6 F (36.4 C)-98.7 F (37.1 C)] 97.6 F (36.4 C) (04/27 0900) Pulse Rate:  [36-101] 37 (04/27 0900) Resp:  [14-28] 24 (04/27 0900) BP: (104-165)/(51-84) 123/61 mmHg (04/27 0900) SpO2:  [76 %-100 %] 97 % (04/27 0900) Weight:  [85.775 kg (189 lb 1.6 oz)-86.183 kg (190 lb)] 85.775 kg (189 lb 1.6 oz) (04/27 0800)   Filed Vitals:   03/08/15 0730 03/08/15 0745 03/08/15 0800 03/08/15 0900  BP: 114/51 113/52 116/60 123/61  Pulse: 38 38 38 37  Temp:  98.2 F (36.8 C) 98.5 F (36.9 C) 97.6 F (36.4 C)  TempSrc:  Oral Oral Oral  Resp: 20 20 20 24   Height:      Weight:   85.775 kg (189 lb 1.6 oz)   SpO2: 96% 95% 97% 97%   Known 1st degree AV heart block--now on continuous telemetry while in AICU, with Wenckebach pattern at present. Seen by Dr. Johnsie Cancel on 10/19/14 for consult, with plan made for cardiac MRI pp.  CBC Latest Ref Rng 03/08/2015 03/07/2015 10/14/2014  WBC 4.0 - 10.5 K/uL 19.1(H) 11.3(H) 10.8(H)  Hemoglobin 12.0 - 15.0 g/dL 11.6(L) 11.1(L) 13.2  Hematocrit 36.0 - 46.0 % 33.8(L) 33.2(L) 37.9  Platelets 150 - 400 K/uL 239 229 233   CMP     Component Value Date/Time   NA 134* 03/08/2015 0009   K 4.4 03/08/2015 0009   CL 104 03/08/2015 0009   CO2 19 03/08/2015 0009   GLUCOSE 135* 03/08/2015 0009   BUN 19 03/08/2015 0009   CREATININE 0.93 03/08/2015 0009   CALCIUM 8.1* 03/08/2015 0009   PROT 5.4* 03/08/2015 0009   ALBUMIN 2.5* 03/08/2015 0009   AST 33 03/08/2015 0009   ALT 32 03/08/2015 0009   ALKPHOS  117 03/08/2015 0009   BILITOT 0.2* 03/08/2015 0009   GFRNONAA 86* 03/08/2015 0009   GFRAA >90 03/08/2015 0009   Magnesium level 4.8 at 0009  I/O balance: +1123 Output since 7am = 115 cc.  Physical Exam:  General: fatigued, but oriented Lochia: appropriate Uterine Fundus: firm Abdomen:  Faint bowel sounds Incision: Pressure dressing CDI DVT Evaluation: No evidence of DVT seen on physical exam. Negative Homan's sign.  SCDs in place Foley draining dark yellow urine  IV rate at LR 125 cc/hr.  Assessment/Plan: Status post cesarean delivery, day 0--fetal intolerance to labor. 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 Now complete heart block, with high T waves--asymptomatic Stable early OB recovery  IV bolus now Monitor urine output Continue current care. Continue magnesium sulfate x 24 hours after delivery (0429) PIH labs and mag level scheduled at 1200. Dr. Charlesetta Garibaldi will consult with cardiology regarding patient status. Baby Rh positive--patient will need Rhophylac.   Donnel Saxon MSN, CNM 03/08/2015, 10:14 AM   I spoke with Dr Johnsie Cancel a 12 lead EKG was ordered and faxed to the office.  He will then decide if she needs a consult.  The  Pt is asymptomatic

## 2015-03-08 NOTE — Lactation Note (Signed)
This note was copied from the chart of [place]. Lactation Consultation Note  Patient Name: Laurie Phillips Date: 03/08/2015 Reason for consult: Initial assessment Baby 5 hours of life. Baby in [facility] for observation d/t grunting. Discussed LPI education with mom and family and mom given LPI sheet for review. Started mom using DEBP and mom's colostrum started flowing. CN nurse brought baby into room for nursing/STS. Assisted mom to latch baby to right breast in football position. After a couple of attempts, baby latched deeply, suckling rhythmically with a few swallows noted. After 7 minutes of nursing, assisted mom and family to feed baby 5 mls of colostrum that mom able to express. Baby supplemented with EBM using curve-tipped syringe and mom's finger. Baby tolerated nursing and syringe/finger-feeding well. Baby's saturations remained in the mid to high 90s. Enc mom to nurse baby with cues, and at least every 3 hours as able if baby in mom's room. Discussed limiting total feeds to 30 minutes. Enc mom to use DEBP every 3 hours for 15 minutes, so that baby can have EBM.   Maternal Data Does the patient have breastfeeding experience prior to this delivery?: No  Feeding Feeding Type: Breast Fed Length of feed: 7 min  LATCH Score/Interventions Latch: Repeated attempts needed to sustain latch, nipple held in mouth throughout feeding, stimulation needed to elicit sucking reflex. Intervention(s): Adjust position;Assist with latch;Breast compression  Audible Swallowing: A few with stimulation Intervention(s): Skin to skin;Hand expression  Type of Nipple: Everted at rest and after stimulation  Comfort (Breast/Nipple): Soft / non-tender     Hold (Positioning): Assistance needed to correctly position infant at breast and maintain latch.  LATCH Score: 7  Lactation Tools Discussed/Used Pump Review: Setup, frequency, and cleaning;Milk Storage Initiated by:: JW Date  initiated:: 03/08/15   Consult Status Consult Status: Follow-up Date: 03/09/15 Follow-up type: In-patient    Inocente Salles 03/08/2015, 10:10 AM

## 2015-03-09 ENCOUNTER — Encounter (HOSPITAL_COMMUNITY): Payer: Self-pay | Admitting: Obstetrics and Gynecology

## 2015-03-09 ENCOUNTER — Inpatient Hospital Stay (HOSPITAL_COMMUNITY): Payer: Medicaid Other

## 2015-03-09 DIAGNOSIS — I442 Atrioventricular block, complete: Secondary | ICD-10-CM

## 2015-03-09 NOTE — Progress Notes (Signed)
Patient Name: Laurie Phillips Date of Encounter: 03/09/2015     Principal Problem:   Status post primary low transverse cesarean section Active Problems:   Pre-eclampsia    SUBJECTIVE  The patient remains in complete heart block.  She is not having any symptoms of chest pain shortness of breath dizziness or syncope.  She has not yet been out of bed.  CURRENT MEDS . ibuprofen  600 mg Oral 4 times per day  . prenatal multivitamin  1 tablet Oral Q1200  . scopolamine  1 patch Transdermal Once  . senna-docusate  2 tablet Oral Q24H  . simethicone  80 mg Oral TID PC  . simethicone  80 mg Oral Q24H  . Tdap  0.5 mL Intramuscular Once    OBJECTIVE  Filed Vitals:   03/09/15 0900 03/09/15 1000 03/09/15 1117 03/09/15 1206  BP: 155/81  127/66   Pulse: 33 42 38 37  Temp:    98.1 F (36.7 C)  TempSrc:    Axillary  Resp: 17 18 17 15   Height:      Weight:      SpO2: 97% 98% 98% 98%    Intake/Output Summary (Last 24 hours) at 03/09/15 1310 Last data filed at 03/09/15 1217  Gross per 24 hour  Intake 4149.24 ml  Output   2890 ml  Net 1259.24 ml   Filed Weights   03/07/15 1427 03/08/15 0800 03/09/15 0600  Weight: 190 lb (86.183 kg) 189 lb 1.6 oz (85.775 kg) 191 lb 4.8 oz (86.773 kg)    PHYSICAL EXAM  General: Pleasant, NAD. Neuro: Alert and oriented X 3. Moves all extremities spontaneously. Psych: Normal affect. HEENT:  Normal  Neck: Supple without bruits or JVD. Lungs:  Resp regular and unlabored, CTA. Heart: RRR no s3, s4, or murmurs. Abdomen: Soft, non-tender, non-distended, BS + x 4.  Extremities: Moderate pedal edema  Accessory Clinical Findings  CBC  Recent Labs  03/08/15 0009 03/08/15 1215  WBC 19.1* 24.2*  HGB 11.6* 9.3*  HCT 33.8* 28.0*  MCV 86.2 87.5  PLT 239 573   Basic Metabolic Panel  Recent Labs  03/08/15 0009 03/08/15 1215  NA 134* 133*  K 4.4 5.3*  CL 104 104  CO2 19 20  GLUCOSE 135* 123*  BUN 19 23  CREATININE 0.93 0.98  CALCIUM  8.1* 7.1*  MG 4.8* 6.7*   Liver Function Tests  Recent Labs  03/08/15 0009 03/08/15 1215  AST 33 27  ALT 32 26  ALKPHOS 117 92  BILITOT 0.2* 0.2*  PROT 5.4* 4.9*  ALBUMIN 2.5* 2.4*   No results for input(s): LIPASE, AMYLASE in the last 72 hours. Cardiac Enzymes No results for input(s): CKTOTAL, CKMB, CKMBINDEX, TROPONINI in the last 72 hours. BNP Invalid input(s): POCBNP D-Dimer No results for input(s): DDIMER in the last 72 hours. Hemoglobin A1C No results for input(s): HGBA1C in the last 72 hours. Fasting Lipid Panel No results for input(s): CHOL, HDL, LDLCALC, TRIG, CHOLHDL, LDLDIRECT in the last 72 hours. Thyroid Function Tests No results for input(s): TSH, T4TOTAL, T3FREE, THYROIDAB in the last 72 hours.  Invalid input(s): FREET3  TELE  Normal sinus rhythm with complete heart block  ECG  09-Mar-2015 06:59:18 [facility] ADL ROUTINE RECORD  Critical Test Result: AV Block Marked sinus bradycardia with A-V dissociation and Junctional bradycardia with Sinus/atrial capture Cannot rule out Anterior infarct , age undetermined Abnormal ECG  Complete heart block with ventricular rate 32. Personally reviewed Radiology/Studies  US Ob  Comp + 14 Wk  03/08/2015   OBSTETRICAL ULTRASOUND: This exam was performed within a [place] Ultrasound Department. The OB US report was generated in the AS system, and faxed to the ordering physician.   This report is available in the [employer]. See the [facility] Obstetric US report via the Image Link.   ASSESSMENT AND PLAN  1.  Complete heart block.  Prior history of first-degree heart block. 2.  Recent pregnancy with cesarean section yesterday.  Plan: I will stop her scopolamine patch at this point.  Her situation was discussed with Dr. Caryl Comes.  He suggests continuing to watch her on telemetry.  Hopefully if this is a pregnancy-related condition it will improve over the next several days.  In the meantime we will obtain  some additional tests.  We will look for indications of sarcoidosis by obtaining a serum Ace level and looking at a CT of the chest without contrast for hilar adenopathy.  We will also check a Lyme disease titer.  Dr. Caryl Comes will plan to see her on Friday.  Signed, Darlin Coco MD

## 2015-03-09 NOTE — Lactation Note (Addendum)
This note was copied from the chart of [place]. Lactation Consultation Note  Patient Name: Laurie Phillips [id number] Date: 03/09/2015 Reason for consult: Follow-up assessment NICU baby 31 hours of life, brought to mom in AICU accompanied by NICU RN. LC assisted to latch baby to breast. Baby fussy at breast, so enc mom to hold baby STS to settle before attempting to latch again. Baby settled and mom able to move baby around to breast in football position. Enc FOB to use a clean finger and enc baby to suckle. However, baby not interested. Assisted mom to hand express some EBM into baby's mouth. Baby still not wanting to latch, but did take several drops at breast. Left mom to continue to hold baby STS and nuzzle at breast. Mom very happy to have baby STS. Enc parents to call for assistance as needed. Enc mom to keep pumping to provide EBM for baby, protect her supply, and keep from getting engorged. Discussed with mom that pumping prior to putting baby to breast is good because it is more difficult for baby to latch to a full breast.  Maternal Data Has patient been taught Hand Expression?: Yes  Feeding Feeding Type: Breast Fed Length of feed: 0 min  LATCH Score/Interventions Latch: Too sleepy or reluctant, no latch achieved, no sucking elicited. Intervention(s): Skin to skin Intervention(s): Adjust position;Assist with latch;Breast compression  Audible Swallowing: None Intervention(s): Skin to skin;Hand expression  Type of Nipple: Everted at rest and after stimulation  Comfort (Breast/Nipple): Soft / non-tender     Hold (Positioning): Assistance needed to correctly position infant at breast and maintain latch. Intervention(s): Skin to skin;Support Pillows;Breastfeeding basics reviewed  LATCH Score: 5  Lactation Tools Discussed/Used Tools: Pump Breast pump type: Double-Electric Breast Pump   Consult Status Consult Status: Follow-up Date: 03/10/15 Follow-up type:  In-patient    Inocente Salles 03/09/2015, 12:13 PM

## 2015-03-09 NOTE — Progress Notes (Addendum)
Subjective: Postpartum Day 1: Cesarean Delivery due to NRFHT Patient up ad lib, reports no syncope or dizziness. Feeding:  breast Contraceptive plan:  Unsure Denies the need for pain medicine  Objective: Vital signs in last 24 hours: Temp:  [98 F (36.7 C)-98.7 F (37.1 C)] 98.1 F (36.7 C) (04/28 1206) Pulse Rate:  [29-42] 38 (04/28 1410) Resp:  [10-24] 16 (04/28 1410) BP: (111-155)/(52-81) 120/56 mmHg (04/28 1410) SpO2:  [95 %-100 %] 96 % (04/28 1410) Weight:  [191 lb 4.8 oz (86.773 kg)] 191 lb 4.8 oz (86.773 kg) (04/28 0600)  Physical Exam:  General: alert and cooperative Lochia: appropriate Uterine Fundus: firm Abdomen:  + bowel sounds, non distended Incision: healing well  Honeycomb dressing  LE + 3 pitting edema DVT Evaluation: No evidence of DVT seen on physical exam. Homan's sign: Negative   Recent Labs  03/07/15 1150 03/08/15 0009 03/08/15 1215  HGB 11.1* 11.6* 9.3*  HCT 33.2* 33.8* 28.0*  WBC 11.3* 19.1* 24.2*    Assessment: Status post Cesarean section day 1. Doing well postoperatively.  Honeycomb dressing in place, no significant drainage Anemia - hemodynamicly stable.    Plan: Continue current care. Breastfeeding, Lactation consult and Circumcision prior to discharge Dr. Alesia Richards  updated on patient status  Cardiology plan per Dr Mare Ferrari "At this point the patient is not in any hemodynamic distress from her complete heart block. Discussed with her obstetrician. We will stop her IV magnesium since magnesium can sometimes exacerbate heart block. Continue to observe on telemetry.  After discharge we will want her to follow back up with Dr. Johnsie Cancel."  Additionally Dr Mare Ferrari plans on consulting with Dr Jeanette Caprice Standard, CNM, MSN 03/09/2015. 2:37 PM I saw and examined patient at bedside and agree with above findings assessment and plan.   Further work-up as per hematology.  Dr. Alesia Richards.

## 2015-03-09 NOTE — Progress Notes (Signed)
UR chart review completed.  

## 2015-03-09 NOTE — Lactation Note (Signed)
This note was copied from the chart of [place]. Lactation Consultation Note  Patient Name: [name] [id number] Date: 03/09/2015 Reason for consult: Follow-up assessment NICU baby 30 hours of life. Mom reports that her milk is coming in and her breasts are feeling full. FOB in room states that he will be taking EBM to baby for next feeding. Enc mom to keep pumping every 2-3 hours and to pump past the 15 minutes if her breast are still dripping. Enc mom to get in touch with [facility] for an appointment and a DEBP. Mom aware of pumping rooms in NICU and OP/BFSG and [place] phone line assistance after D/C.   Maternal Data    Feeding Feeding Type: Breast Milk with Formula added Length of feed: 30 min  LATCH Score/Interventions                      Lactation Tools Discussed/Used Tools: Pump Breast pump type: Double-Electric Breast Pump   Consult Status Consult Status: Follow-up Date: 03/10/15 Follow-up type: In-patient    Inocente Salles 03/09/2015, 10:55 AM

## 2015-03-10 LAB — BASIC METABOLIC PANEL
Anion gap: 5 (ref 5–15)
BUN: 22 mg/dL (ref 6–23)
CALCIUM: 7.4 mg/dL — AB (ref 8.4–10.5)
CO2: 24 mmol/L (ref 19–32)
Chloride: 110 mmol/L (ref 96–112)
Creatinine, Ser: 0.9 mg/dL (ref 0.50–1.10)
GFR, EST NON AFRICAN AMERICAN: 89 mL/min — AB (ref >90)
GLUCOSE: 89 mg/dL (ref 70–99)
Potassium: 4.8 mmol/L (ref 3.5–5.1)
Sodium: 139 mmol/L (ref 135–145)

## 2015-03-10 LAB — B. BURGDORFI ANTIBODIES: B burgdorferi Ab IgG+IgM: <0.91 {ISR} (ref 0.00–0.90)

## 2015-03-10 LAB — CBC
HCT: 26.9 % — ABNORMAL LOW (ref 36.0–46.0)
HEMOGLOBIN: 8.8 g/dL — AB (ref 12.0–15.0)
MCH: 29.4 pg (ref 26.0–34.0)
MCHC: 32.7 g/dL (ref 30.0–36.0)
MCV: 90 fL (ref 78.0–100.0)
PLATELETS: 147 10*3/uL — AB (ref 150–400)
RBC: 2.99 MIL/uL — ABNORMAL LOW (ref 3.87–5.11)
RDW: 16.1 % — AB (ref 11.5–15.5)
WBC: 13.4 10*3/uL — AB (ref 4.0–10.5)

## 2015-03-10 LAB — ANGIOTENSIN CONVERTING ENZYME: Angiotensin-Converting Enzyme: 26 U/L (ref 14–82)

## 2015-03-10 MED ORDER — OXYCODONE-ACETAMINOPHEN 5-325 MG PO TABS
1.0000 | ORAL_TABLET | ORAL | Status: DC | PRN
  Administered 2015-03-11 – 2015-03-13 (×6): 1 via ORAL
  Filled 2015-03-10 (×7): qty 1

## 2015-03-10 MED ORDER — BACITRACIN-NEOMYCIN-POLYMYXIN OINTMENT TUBE
TOPICAL_OINTMENT | CUTANEOUS | Status: DC | PRN
  Administered 2015-03-10 (×2): via TOPICAL
  Filled 2015-03-10: qty 15

## 2015-03-10 MED ORDER — RHO D IMMUNE GLOBULIN 1500 UNIT/2ML IJ SOSY
300.0000 ug | PREFILLED_SYRINGE | Freq: Once | INTRAMUSCULAR | Status: AC
  Administered 2015-03-11: 300 ug via INTRAMUSCULAR
  Filled 2015-03-10: qty 2

## 2015-03-10 MED ORDER — OXYCODONE-ACETAMINOPHEN 5-325 MG PO TABS
0.5000 | ORAL_TABLET | ORAL | Status: DC | PRN
  Administered 2015-03-10: 0.5 via ORAL
  Filled 2015-03-10: qty 1

## 2015-03-10 NOTE — Clinical Social Work Maternal (Signed)
CLINICAL SOCIAL WORK MATERNAL/CHILD NOTE  Patient Details  Name: Laurie Phillips MRN: [id number] Date of Birth: [birth year]  Date:  03/10/2015  Clinical Social Worker Initiating Note:  Ersilia Brawley E. Brigitte Pulse, [place] Date/ Time Initiated:  03/10/15/1530     Child's Name:  Herbie Baltimore "Heath Lark" Rock Nephew   Legal Guardian:   (Parents: Herbie Baltimore and Gray Bernhardt)   Need for Interpreter:  None   Date of Referral:  03/08/15     Reason for Referral:  [place], including SI    Referral Source:  [facility]   Address:  [address], [place], [place] [address]  Phone number:  [phone]   Household Members:      Natural Supports (not living in the home):  Extended Family   Professional Supports: Therapist (MOB is not currently seeing a therapist, but knowns one from the past whom she can call if needed.)   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)   Type of Work:     Education:      Museum/gallery curator Resources:  [employer]   Other Resources:      Cultural/Religious Considerations Which May Impact Care:  None stated  Strengths:  Ability to meet basic needs , Compliance with medical plan , Home prepared for child , Understanding of illness (CSW informed MOB of pediatrician list available at NICU nurses station)   Risk Factors/Current Problems:  None   Cognitive State:  Alert , Linear Thinking  (MOB reports that she is tired)   Mood/Affect:  Calm    CSW Assessment: CSW met with MOB in her AICU room, after first speaking with bedside RN, to introduce myself, offer support and complete assessment due to hx of Bipolar/Anxiety and baby's admission to NICU.  MOB was pleasant and welcoming of CSW's visit.  CSW kept contact brief, as MOB is still on telemetry, and reports that she is tired.  She states, however, that she is feeling well.  She seems to have a good understanding of baby's medical situation and is thankful for the opportunities she has  had to spend with them, though limited due to her condition.   CSW discussed the grief over expectation over what MOB thought her delivery and hospitalization with baby would be.  MOB acknowledges sadness over the extreme difference in what she had planned and what reality has been.  She appears to be coping well, overall.  She states she has had her "moments."  CSW encouraged her to allow herself to process her emotions, while also providing education regarding PPD signs and symptoms to watch for.  CSW also provided awareness of the possibility that emotions related to baby's admission to NICU and or her own health issues experienced at baby's birth may arise months to a year after the experience.  MOB was open about her past dx of Bipolar and Anxiety, but states she does not feel these dx's were accurate.  She reports being diagnosed in high school and being put on numerous medications including, but not limited to: Seroquel, Vyvance, Klonopin and Lunesta.  She states she has not taken these medications since 2010 and has had no mental health symptoms since that time.  She states she saw a therapist/Mike Amedeo Plenty in [facility], [place], whom she liked, during high school, and although she does not see him now, states she could contact him if she felt she needed counseling again in the future.  She reports no emotional concerns at this time, but seemed appreciative  of the information given by CSW and for CSW's concern for her emotional wellbeing.  CSW provided contact information and explained ongoing support services offered by NICU CSW.  MOB states no questions, concerns or needs for CSW at this time and agrees to call if needs arise.  CSW Plan/Description:  Patient/Family Education , Psychosocial Support and Ongoing Assessment of Needs    Alphonzo Cruise, [place] 03/10/2015, 8:01 PM

## 2015-03-10 NOTE — Progress Notes (Signed)
Subjective: Postpartum Day 2: Cesarean Delivery Patient reports incisional pain, tolerating PO and + flatus.    Objective: Vital signs in last 24 hours: Temp:  [98.6 F (37 C)-99 F (37.2 C)] 98.6 F (37 C) (04/29 1114) Pulse Rate:  [31-51] 42 (04/29 1114) Resp:  [11-25] 17 (04/29 1114) BP: (120-155)/(51-76) 136/76 mmHg (04/29 0916) SpO2:  [94 %-100 %] 97 % (04/29 1114) Weight:  [191 lb 6.4 oz (86.818 kg)] 191 lb 6.4 oz (86.818 kg) (04/29 0500)  Physical Exam:  General: alert and cooperative Lochia: appropriate Uterine Fundus: firm Incision: healing well DVT Evaluation: No evidence of DVT seen on physical exam. CV RRR Lungs CTAB   Recent Labs  03/08/15 1215 03/10/15 0542  HGB 9.3* 8.8*  HCT 28.0* 26.9*    Assessment/Plan: Status post Cesarean section. Postoperative course complicated by by heart block  Awaiting cardiology appointment.  [place] A 03/10/2015, 1:28 PM

## 2015-03-10 NOTE — Consult Note (Signed)
ELECTROPHYSIOLOGY CONSULT NOTE  Patient ID: Laurie Phillips, MRN: [id number], DOB/AGE: Jan 09, 1991 24 y.o. Admit date: 03/07/2015 Date of Consult: 03/10/2015  Primary Physician: Abigail Miyamoto, MD Primary Cardiologist: pn  Chief Complaint: cOMPLEte heart block   HPI Laurie Phillips is a 24 y.o. female delivered a child by c-section with course complicated by preeclampsia      HR were noted as 60 on OR record   Seen by PN 12/16 for bradycardia and profound 1 AVB with PR 315--365   eval incl echo>>normal  CT >> no hilar adenopathy, B effusions ACE level and Lyme Ab are neg  She has hx of exercise intolerance assoc with palpitations and lightheadedness reproducibly worsening with exercise  One syncopal episode but after banging her knee with considerable pain  Hx of low blood pressure and now with high blood pressure  Past Medical History  Diagnosis Date  . Headache   . Fibroid   . Sinus bradycardia   . Chest pain   . First degree AV block   . Bipolar 1 disorder   . Generalized anxiety disorder   . Rh negative, antepartum   . Fibroid   . Attention deficit hyperactivity disorder   . Pneumonia       Surgical History:  Past Surgical History  Procedure Laterality Date  . Tonsillectomy    . Knee arthroscopy Right   . Cesarean section N/A 03/08/2015    Procedure: CESAREAN SECTION;  Surgeon: Everett Graff, MD;  Location: [facility];  Service: Obstetrics;  Laterality: N/A;     Home Meds: Prior to Admission medications   Medication Sig Start Date End Date Taking? Authorizing Provider  calcium carbonate (TUMS - DOSED IN MG ELEMENTAL CALCIUM) 500 MG chewable tablet Chew 2 tablets by mouth daily as needed for indigestion or heartburn.   Yes Historical Provider, MD  Prenatal Vit-Fe Fumarate-FA (PRENATAL MULTIVITAMIN) TABS tablet Take 1 tablet by mouth at bedtime.   Yes Historical Provider, MD    Inpatient Medications:  . ibuprofen  600 mg Oral 4 times per day  . prenatal  multivitamin  1 tablet Oral Q1200  . rho (d) immune globulin  300 mcg Intramuscular Once  . senna-docusate  2 tablet Oral Q24H  . simethicone  80 mg Oral TID PC  . simethicone  80 mg Oral Q24H  . Tdap  0.5 mL Intramuscular Once    Allergies: No Known Allergies  History   Social History  . Marital Status: Married    Spouse Name: N/A  . Number of Children: N/A  . Years of Education: N/A   Occupational History  . Not on file.   Social History Main Topics  . Smoking status: Never Smoker   . Smokeless tobacco: Never Used  . Alcohol Use: No  . Drug Use: No  . Sexual Activity: Yes    Birth Control/ Protection: None   Other Topics Concern  . Not on file   Social History Narrative     Family History  Problem Relation Age of Onset  . Adopted: Yes  . Family history unknown: Yes     ROS:  Please see the history of present illness.     All other systems reviewed and negative.    Physical Exam: Blood pressure 145/61, pulse 46, temperature 99.5 F (37.5 C), temperature source Oral, resp. rate 20, height 5' 2.5" (1.588 m), weight 191 lb 6.4 oz (86.818 kg), last menstrual period 06/28/2014, SpO2 99 %, unknown if currently breastfeeding. General:  Well developed, well nourished female in no acute distress. Head: Normocephalic, atraumatic, sclera non-icteric, no xanthomas, nares are without discharge. EENT: normal Lymph Nodes:  none Back: without scoliosis/kyphosis, no CVA tendersness Neck: Negative for carotid bruits. JVD not elevated. Lungs: Clear bilaterally to auscultation without wheezes, rales, or rhonchi. Breathing is unlabored. Heart: RRR with variable S1 S2. No* murmur , rubs, or gallops appreciated. Abdomen: Soft, non-tender, non-distended with normoactive bowel sounds. No hepatomegaly. No rebound/guarding. No obvious abdominal masses. Msk:  Strength and tone appear normal for age. Extremities: No clubbing or cyanosis. No* edema.  Distal pedal pulses are 2+ and equal  bilaterally. Skin: Warm and Dry Neuro: Alert and oriented X 3. CN III-XII intact Grossly normal sensory and motor function . Psych:  Responds to questions appropriately with a normal affect.      Labs: Cardiac Enzymes No results for input(s): CKTOTAL, CKMB, TROPONINI in the last 72 hours. CBC Lab Results  Component Value Date   WBC 13.4* 03/10/2015   HGB 8.8* 03/10/2015   HCT 26.9* 03/10/2015   MCV 90.0 03/10/2015   PLT 147* 03/10/2015   PROTIME: No results for input(s): LABPROT, INR in the last 72 hours. Chemistry  Recent Labs Lab 03/08/15 1215 03/10/15 0542  NA 133* 139  K 5.3* 4.8  CL 104 110  CO2 20 24  BUN 23 22  CREATININE 0.98 0.90  CALCIUM 7.1* 7.4*  PROT 4.9*  --   BILITOT 0.2*  --   ALKPHOS 92  --   ALT 26  --   AST 27  --   GLUCOSE 123* 89   Lipids No results found for: CHOL, HDL, LDLCALC, TRIG BNP No results found for: PROBNP Thyroid Function Tests: No results for input(s): TSH, T4TOTAL, T3FREE, THYROIDAB in the last 72 hours.  Invalid input(s): FREET3    Miscellaneous No results found for: DDIMER  Radiology/Studies:  Ct Chest Wo Contrast  03/09/2015   CLINICAL DATA:  Postpartum C-section. Abnormal ECG. Complete heart block evaluate for sarcoid.  EXAM: CT CHEST WITHOUT CONTRAST  TECHNIQUE: Multidetector CT imaging of the chest was performed following the standard protocol without IV contrast.  COMPARISON:  None.  FINDINGS: Mediastinum: Normal heart size. The trachea is patent and appears normal. Normal appearance of the esophagus. No mediastinal or hilar adenopathy identified. No supraclavicular or axillary adenopathy.  Lungs/Pleura: There are small bilateral pleural effusions identified. Overlying compressive type atelectasis is noted. No evidence for pneumonia. No significant interstitial edema identified.  Upper Abdomen: The visualized portions of the liver and spleen are normal.  Musculoskeletal: Review of the visualized osseous structures is  unremarkable. There is no aggressive lytic or sclerotic bone lesions identified.  IMPRESSION: 1. Bilateral pleural effusions and bibasilar atelectasis. 2. No evidence for mediastinal or hilar adenopathy.   Electronically Signed   By: Kerby Moors M.D.   On: 03/09/2015 14:50   US Ob Comp + 14 Wk  03/08/2015   OBSTETRICAL ULTRASOUND: This exam was performed within a [facility]. The OB US report was generated in the AS system, and faxed to the ordering physician.   This report is available in the [facility]. See the [facility] Obstetric US report via the Image Link.   EKG: sinus 80 with CHB and narrow QRS  Max HR with walking 55??  Assessment and Plan:   Complete Heart Block  Peripartum  Pt with progressive conduction system disease with 1AVB (365 msec) in dec.  Now with complete heart block and stable  but slow junctional rhythm.  NO clear cause and will almost certainly come to pacing but would like to give it some time  Would like to keep her monitored until Monday when we can discharge her with home remote monitor Would anticipate outpt MRI cardiac to look for contribtory causes, although CT and blood work point away from [place]     Will follow over weekend  Reviewed the above with patient husband, and parents  Virl Axe

## 2015-03-10 NOTE — Progress Notes (Signed)
   03/10/15 2009  Vitals  Temp 97.9 F (36.6 C)  Temp Source Oral  BP (!) 152/74 mmHg  MAP (mmHg) 96  BP Location Left Arm  BP Method Automatic  Patient Position (if appropriate) Lying  Pulse Rate (!) 42  Pulse Rate Source Monitor  ECG Heart Rate (!) 42  Resp 19  PCA/Epidural/Spinal Assessment  Respiratory Pattern Regular;Unlabored;Symmetrical  Oxygen Therapy  SpO2 99 %  O2 Device Room Air  Ambulated around unit as per MD's order. HR 41-58 02 Sat 98 % R/A. Pt denies SOB, CP, Epigastric pain, blurred vision but  c/o incision pain.

## 2015-03-10 NOTE — Lactation Note (Signed)
This note was copied from the chart of [place]. Lactation Consultation Note  Patient Name: Laurie Phillips [id number] Date: 03/10/2015 Reason for consult: Follow-up assessment    With this mom of a NICU baby, now 16 hours old, and 36 3/7 weeks CGA. Mom has an abundant supply of milk - she has pumped over 12 ounces of milk since this morning. The baby latches well, but falls alseep at the breast, skin to skin. With stimulation, the baby di suckle with visible swallows, intermittently.  Mom came in wheelchair from [facility], still on heart monitor, in heart block. Mom very tearful/emotional when she first held the baby today. After about 10 minutes, mom calmer, just happy to be with her baby. Mom has reason to be overwhelmed - she may need a pacemaker inserted.  Mom knows to call for questions/concerns.    Maternal Data    Feeding Feeding Type: Breast Fed Length of feed: 30 min  LATCH Score/Interventions Latch: Repeated attempts needed to sustain latch, nipple held in mouth throughout feeding, stimulation needed to elicit sucking reflex. Intervention(s): Skin to skin;Teach feeding cues;Waking techniques Intervention(s): Adjust position;Assist with latch;Breast massage  Audible Swallowing: A few with stimulation (mom sucking in termitently, mom has abundant supply) Intervention(s): Skin to skin;Hand expression  Type of Nipple: Everted at rest and after stimulation  Comfort (Breast/Nipple): Soft / non-tender  Problem noted: Filling  Hold (Positioning): Assistance needed to correctly position infant at breast and maintain latch. Intervention(s): Breastfeeding basics reviewed;Support Pillows;Position options;Skin to skin  LATCH Score: 7  Lactation Tools Discussed/Used     Consult Status Consult Status: Follow-up Date: 03/11/15 Follow-up type: In-patient    Tonna Corner 03/10/2015, 1:16 PM

## 2015-03-10 NOTE — Lactation Note (Signed)
This note was copied from the chart of [place]. Lactation Consultation Note  Patient Name: Laurie Phillips [id number] Date: 03/10/2015 Reason for consult: Follow-up assessment   With this mom in [place] baby in NICU. I asked mom if since I saw her this morning, she had any needs or questions. She said no, she was tired and just wanted to sleep. Mom had pumped 3 1/2 hours ago. I told her to continue napping, and I would have her nurse see in in an hours, she was ready to pump. Since mom has an abundant supply, I did not want her to get engored. I asked her to try and not go longer than 5 hours without pumping, and to pump at least 5-8 times a day, as possible.   Maternal Data    Feeding Feeding Type: Breast Milk Length of feed: 35 min  LATCH Score/Interventions                      Lactation Tools Discussed/Used     Consult Status Consult Status: Follow-up Date: 03/11/15 Follow-up type: In-patient    Tonna Corner 03/10/2015, 6:18 PM

## 2015-03-11 LAB — TYPE AND SCREEN
ABO/RH(D): B NEG
Antibody Screen: POSITIVE
DAT, IgG: NEGATIVE
Unit division: 0
Unit division: 0

## 2015-03-11 LAB — RH IG WORKUP (INCLUDES ABO/RH)
ABO/RH(D): B NEG
FETAL SCREEN: NEGATIVE
Gestational Age(Wks): 36.1
Unit division: 0

## 2015-03-11 NOTE — Progress Notes (Signed)
Patient ID: Laurie Phillips, female   DOB: [birth year], 24 y.o.   MRN: [id number]    Patient Name: Laurie Phillips Date of Encounter: 03/11/2015     Principal Problem:   Status post primary low transverse cesarean section Active Problems:   Pre-eclampsia    SUBJECTIVE  Holding baby, no complaints. Denies chest pain or sob.   CURRENT MEDS . ibuprofen  600 mg Oral 4 times per day  . prenatal multivitamin  1 tablet Oral Q1200  . senna-docusate  2 tablet Oral Q24H  . simethicone  80 mg Oral TID PC  . simethicone  80 mg Oral Q24H  . Tdap  0.5 mL Intramuscular Once    OBJECTIVE  Filed Vitals:   03/11/15 1200 03/11/15 1207 03/11/15 1208 03/11/15 1532  BP:   129/68 141/65  Pulse: 46   42  Temp:  98.1 F (36.7 C)  98.2 F (36.8 C)  TempSrc:  Oral  Oral  Resp: 18 18 19 18   Height:      Weight:      SpO2: 99%  97% 100%    Intake/Output Summary (Last 24 hours) at 03/11/15 1611 Last data filed at 03/11/15 1200  Gross per 24 hour  Intake    720 ml  Output   2950 ml  Net  -2230 ml   Filed Weights   03/08/15 0800 03/09/15 0600 03/10/15 0500  Weight: 189 lb 1.6 oz (85.775 kg) 191 lb 4.8 oz (86.773 kg) 191 lb 6.4 oz (86.818 kg)    PHYSICAL EXAM  General: Pleasant, NAD. Neuro: Alert and oriented X 3. Moves all extremities spontaneously. Psych: Normal affect. HEENT:  Normal  Neck: Supple without bruits or JVD. Lungs:  Resp regular and unlabored, CTA. Heart: RRR no s3, s4, or murmurs. Abdomen: Soft, non-tender, non-distended, BS + x 4.  Extremities: No clubbing, cyanosis or edema. DP/PT/Radials 2+ and equal bilaterally.  Accessory Clinical Findings  CBC  Recent Labs  03/10/15 0542  WBC 13.4*  HGB 8.8*  HCT 26.9*  MCV 90.0  PLT 465*   Basic Metabolic Panel  Recent Labs  03/10/15 0542  NA 139  K 4.8  CL 110  CO2 24  GLUCOSE 89  BUN 22  CREATININE 0.90  CALCIUM 7.4*   Liver Function Tests No results for input(s): AST, ALT, ALKPHOS, BILITOT, PROT,  ALBUMIN in the last 72 hours. No results for input(s): LIPASE, AMYLASE in the last 72 hours. Cardiac Enzymes No results for input(s): CKTOTAL, CKMB, CKMBINDEX, TROPONINI in the last 72 hours. BNP Invalid input(s): POCBNP D-Dimer No results for input(s): DDIMER in the last 72 hours. Hemoglobin A1C No results for input(s): HGBA1C in the last 72 hours. Fasting Lipid Panel No results for input(s): CHOL, HDL, LDLCALC, TRIG, CHOLHDL, LDLDIRECT in the last 72 hours. Thyroid Function Tests No results for input(s): TSH, T4TOTAL, T3FREE, THYROIDAB in the last 72 hours.  Invalid input(s): FREET3  TELE  NSR with CHB     Radiology/Studies  Ct Chest Wo Contrast  03/09/2015   CLINICAL DATA:  Postpartum C-section. Abnormal ECG. Complete heart block evaluate for sarcoid.  EXAM: CT CHEST WITHOUT CONTRAST  TECHNIQUE: Multidetector CT imaging of the chest was performed following the standard protocol without IV contrast.  COMPARISON:  None.  FINDINGS: Mediastinum: Normal heart size. The trachea is patent and appears normal. Normal appearance of the esophagus. No mediastinal or hilar adenopathy identified. No supraclavicular or axillary adenopathy.  Lungs/Pleura: There are small bilateral pleural effusions identified. Overlying compressive type  atelectasis is noted. No evidence for pneumonia. No significant interstitial edema identified.  Upper Abdomen: The visualized portions of the liver and spleen are normal.  Musculoskeletal: Review of the visualized osseous structures is unremarkable. There is no aggressive lytic or sclerotic bone lesions identified.  IMPRESSION: 1. Bilateral pleural effusions and bibasilar atelectasis. 2. No evidence for mediastinal or hilar adenopathy.   Electronically Signed   By: Kerby Moors M.D.   On: 03/09/2015 14:50   US Ob Comp + 14 Wk  03/08/2015   OBSTETRICAL ULTRASOUND: This exam was performed within a [place] [facility] Department. The OB US report was generated in  the AS system, and faxed to the ordering physician.   This report is available in the [facility]. See the [facility] Obstetric US report via the Image Link.   ASSESSMENT AND PLAN  1. Complete heart block  2. Term gestation, s/p C-section 3. Remote h/o syncope Rec: Monitor on Tele. [place] for discharge home on Monday. Cardiac MR scan as an outpatient. Will likely require PPM. Her junctional escape is adequate.    Asa Baudoin,M.D.  03/11/2015 4:11 PM

## 2015-03-11 NOTE — Progress Notes (Addendum)
Laurie Phillips [id number]  Subjective: Postpartum Day 3: Primary C/S due to [facility], delivery at 36 weeks, pre-eclampsia. New dx complete heart block after delivery, asymptomatic Patient up ad lib, reports no syncope or dizziness.  Pain well-controlled with po meds Feeding:  Pumping for breastmilk--baby remains in NICU, feeding through OG tube Contraceptive plan:  Undecided  Objective: Temp:  [97 F (36.1 C)-99.5 F (37.5 C)] 98.1 F (36.7 C) (04/30 1207) Pulse Rate:  [35-67] 46 (04/30 1200) Resp:  [14-23] 19 (04/30 1208) BP: (128-153)/(61-74) 129/68 mmHg (04/30 1208) SpO2:  [97 %-100 %] 97 % (04/30 1208)   Admission weight: 190 Weight 03/10/15:  191.6  CBC Latest Ref Rng 03/10/2015 03/08/2015 03/08/2015  WBC 4.0 - 10.5 K/uL 13.4(H) 24.2(H) 19.1(H)  Hemoglobin 12.0 - 15.0 g/dL 8.8(L) 9.3(L) 11.6(L)  Hematocrit 36.0 - 46.0 % 26.9(L) 28.0(L) 33.8(L)  Platelets 150 - 400 K/uL 147(L) 202 239   Orthostatics stable.  Physical Exam:  General: alert Lochia: appropriate Uterine Fundus: firm Abdomen:  + bowel sounds, +flatus Incision: Honeycomb dressing CDI DVT Evaluation: No evidence of DVT seen on physical exam. Negative Homan's sign. DTR 2+, no clonus, 2-3+ edema  CARDIOLOGY CONSULT NOTE 03/10/15: Pt with progressive conduction system disease with 1AVB (365 msec) in dec. Now with complete heart block and stable but slow junctional rhythm. NO clear cause and will almost certainly come to pacing but would like to give it some time  Would like to keep her monitored until Monday when we can discharge her with home remote monitor Would anticipate outpt MRI cardiac to look for contribtory causes, although CT and blood work point away from [place]   Will follow over weekend  Reviewed the above with patient husband, and parents  Virl Axe  Assessment/Plan: Status post cesarean delivery, day 3 Complete heart block--followed by cardiology NICU infant Anemia without  hemodynamic instability Anticipate d/c 03/13/15 Support to patient for NICU infant status, own health concerns.   Donnel Saxon MSN, CNM 03/11/2015, 1:25 PM   Pt seen and examined.  I will keep pt until Monday pr cardiology request.

## 2015-03-11 NOTE — Lactation Note (Signed)
This note was copied from the chart of Laurie Phillips. Lactation Consultation Note Baby in NICU. Mom has DEBP at bedside. Hasn't been pumping every three hours. Went 6 hours w/o pumping. Note from last evening LC stated mom had a large production and was talked to about engorgement and management.  Patient Name: [name] [id number] Date: 03/11/2015 Reason for consult: Follow-up assessment;NICU baby;Breast/nipple pain   Maternal Data    Feeding Feeding Type: Breast Milk Nipple Type: Slow - flow Length of feed: 30 min  LATCH Score/Interventions          Comfort (Breast/Nipple): Engorged, cracked, bleeding, large blisters, severe discomfort Problem noted: Engorgment Intervention(s): Ice;Hand expression  Problem noted: Filling Interventions (Filling): Massage;Firm support;Double electric pump  Hold (Positioning): Assistance needed to correctly position infant at breast and maintain latch. Intervention(s): Breastfeeding basics reviewed;Support Pillows;Position options;Skin to skin     Lactation Tools Discussed/Used Breast pump type: Double-Electric Breast Pump   Consult Status Consult Status: Follow-up Date: 03/12/15 Follow-up type: In-patient    Theodoro Kalata 03/11/2015, 12:22 PM

## 2015-03-12 LAB — COMPREHENSIVE METABOLIC PANEL
ALBUMIN: 2.4 g/dL — AB (ref 3.5–5.0)
ALT: 52 U/L (ref 14–54)
AST: 35 U/L (ref 15–41)
Alkaline Phosphatase: 88 U/L (ref 38–126)
Anion gap: 7 (ref 5–15)
BUN: 17 mg/dL (ref 6–20)
CO2: 26 mmol/L (ref 22–32)
CREATININE: 0.76 mg/dL (ref 0.44–1.00)
Calcium: 8.5 mg/dL — ABNORMAL LOW (ref 8.9–10.3)
Chloride: 108 mmol/L (ref 101–111)
GFR calc Af Amer: >60 mL/min (ref >60)
GFR calc non Af Amer: >60 mL/min (ref >60)
Glucose, Bld: 92 mg/dL (ref 70–99)
Potassium: 4.8 mmol/L (ref 3.5–5.1)
Sodium: 141 mmol/L (ref 135–145)
Total Bilirubin: 0.1 mg/dL — ABNORMAL LOW (ref 0.3–1.2)
Total Protein: 5.1 g/dL — ABNORMAL LOW (ref 6.5–8.1)

## 2015-03-12 LAB — CBC
HCT: 30.1 % — ABNORMAL LOW (ref 36.0–46.0)
Hemoglobin: 9.8 g/dL — ABNORMAL LOW (ref 12.0–15.0)
MCH: 29.7 pg (ref 26.0–34.0)
MCHC: 32.6 g/dL (ref 30.0–36.0)
MCV: 91.2 fL (ref 78.0–100.0)
PLATELETS: 188 10*3/uL (ref 150–400)
RBC: 3.3 MIL/uL — AB (ref 3.87–5.11)
RDW: 15.8 % — ABNORMAL HIGH (ref 11.5–15.5)
WBC: 9.7 10*3/uL (ref 4.0–10.5)

## 2015-03-12 LAB — LACTATE DEHYDROGENASE: LDH: 184 U/L (ref 98–192)

## 2015-03-12 LAB — URIC ACID: URIC ACID, SERUM: 7.1 mg/dL — AB (ref 2.3–6.6)

## 2015-03-12 MED ORDER — FERROUS SULFATE 325 (65 FE) MG PO TABS
325.0000 mg | ORAL_TABLET | Freq: Every day | ORAL | Status: DC
  Administered 2015-03-12 – 2015-03-13 (×2): 325 mg via ORAL
  Filled 2015-03-12 (×2): qty 1

## 2015-03-12 MED ORDER — SODIUM CHLORIDE 0.9 % IJ SOLN
3.0000 mL | Freq: Two times a day (BID) | INTRAMUSCULAR | Status: DC
  Administered 2015-03-12: 3 mL via INTRAVENOUS

## 2015-03-12 MED ORDER — SODIUM CHLORIDE 0.9 % IJ SOLN: 3.0000 mL | INTRAMUSCULAR | Status: DC | PRN

## 2015-03-12 MED ORDER — KETOROLAC TROMETHAMINE 30 MG/ML IJ SOLN
30.0000 mg | Freq: Once | INTRAMUSCULAR | Status: AC
  Administered 2015-03-12: 30 mg via INTRAVENOUS
  Filled 2015-03-12: qty 1

## 2015-03-12 MED ORDER — OXYCODONE-ACETAMINOPHEN 5-325 MG PO TABS
1.0000 | ORAL_TABLET | Freq: Once | ORAL | Status: AC
  Administered 2015-03-12: 1 via ORAL

## 2015-03-12 MED ORDER — METOCLOPRAMIDE HCL 5 MG/ML IJ SOLN
10.0000 mg | Freq: Once | INTRAMUSCULAR | Status: AC
  Administered 2015-03-12: 10 mg via INTRAVENOUS
  Filled 2015-03-12: qty 2

## 2015-03-12 MED ORDER — HYDRALAZINE HCL 20 MG/ML IJ SOLN
5.0000 mg | INTRAMUSCULAR | Status: DC | PRN
  Administered 2015-03-12: 5 mg via INTRAVENOUS
  Filled 2015-03-12: qty 1

## 2015-03-12 MED ORDER — SODIUM CHLORIDE 0.9 % IV SOLN: 250.0000 mL | INTRAVENOUS | Status: DC | PRN

## 2015-03-12 NOTE — Progress Notes (Addendum)
Subjective: Postpartum Day 3: Cesarean Delivery. Postoperative course significant for complete heart block, cardiology following.  Patient reports no complaints. Denies chest pain or shortness of breath or palpitations. She is tolerating regular diet.  Ambulating and voiding without difficulty. Her pain is well controlled.    Objective: Vital signs in last 24 hours: Temp:  [98.1 F (36.7 C)-98.5 F (36.9 C)] 98.5 F (36.9 C) (05/01 0900) Pulse Rate:  [31-45] 31 (05/01 1009) Resp:  [11-20] 12 (05/01 1009) BP: (129-158)/(65-88) 158/88 mmHg (05/01 1009) SpO2:  [97 %-100 %] 100 % (05/01 1009) Weight:  [187 lb 14.4 oz (85.231 kg)] 187 lb 14.4 oz (85.231 kg) (05/01 0700)  Physical Exam:  General: alert, cooperative and no distress  CVS: s1, s2, RRR Pulse about 52 Pulm: CTAB Lochia: appropriate Uterine Fundus: firm Incision: With old dried blood. DVT Evaluation: No evidence of DVT seen on physical exam.  3+ edema bilaterally in lower extremities.     Recent Labs  03/10/15 0542  HGB 8.8*  HCT 26.9*    Assessment/Plan: Status post Cesarean section. Post operative course complicated by complete heart block, cardiology following.   Continue current care. Plan to discharge tomorrow per cardiology with outpatient cardiac monitoring and follow-up. Iron tabs for anemia. Discussed with RN, change dressing.  [facility] 03/12/2015, 12:03 PM

## 2015-03-12 NOTE — Lactation Note (Signed)
This note was copied from the chart of [place]. Lactation Consultation Note Late entry: continuation of care note: New mom breast were full w/some knots but not engorged. Mom was uncomfortable. Discussed importance of pumping every three hours and preventing engorgement. ICE applied at intervals. Hand expression to start release of colostrum. Between hand expression and pumping out put of 3 oz. Of colostrum. Labeled in bottle and took to NICU at end of visit. Baby was brought to mom from NICU to BF. Baby latched well about 3 minutes gulping then fell fast a sleep. Unable to stimulate to continue BF. Mom bonded and held STS for about 15 minutes then they took baby back to NICU.  Taught breast massage during BF and pumping to assist in emptying her breast. Demonstrated cleaning pump after use. Assisted in obtaining a deep latch.  Discussed risk of engorgement, clogged ducts, and mastitis. Patient Name: Laurie Phillips [id number] Date: 03/12/2015     Maternal Data    Feeding Feeding Type: Breast Fed Length of feed: 40 min  LATCH Score/Interventions Latch: Grasps breast easily, tongue down, lips flanged, rhythmical sucking. Intervention(s): Skin to skin;Waking techniques Intervention(s): Adjust position;Assist with latch  Audible Swallowing: Spontaneous and intermittent Intervention(s): Skin to skin Intervention(s): Skin to skin  Type of Nipple: Everted at rest and after stimulation  Comfort (Breast/Nipple): Soft / non-tender     Hold (Positioning): No assistance needed to correctly position infant at breast. Intervention(s): Breastfeeding basics reviewed;Support Pillows;Position options;Skin to skin  LATCH Score: 10  Lactation Tools Discussed/Used     Consult Status      Theodoro Kalata 03/12/2015, 11:42 PM

## 2015-03-12 NOTE — Progress Notes (Signed)
Called to see patient with sudden onset occipital HA, extending into temporal areas, right > left.  Occurred after returning from NICU visit around 4p. Denies visual sx, epigastric pain, N/V, SOB, chest pain. Received 1 Percocet at 4:15p.  Had received Ibuprophen 600 mg at 12p.  Patient very tearful.  Ice pack in place on occipital area. Husband at bedside, supportive.  Filed Vitals:   03/12/15 1339 03/12/15 1400 03/12/15 1619 03/12/15 1644  BP: 137/84  173/68 175/81  Pulse: 49 41 37   Temp:   98.7 F (37.1 C)   TempSrc:   Oral   Resp: 16 10 15 14   Height:      Weight:      SpO2: 100% 99% 100%    Physical Exam: Tearful due to occipital HA, anxious. Chest clear Heart--persistence of complete heart block, rate 37-47 PERL, full grip strength and ROM of all extremities.  No neuro deficits noted. Incision--Honeycomb dressing CDI Ext--DTR 3-4+, 1-2 beats clonus bilaterally, 2-3+ edema in LE (no worse)  I/O balance:  Not currently calculated (staff had removed hat from toilet this am).  Has been voiding frequently.  Difficult to ascertain if BP is elevated due to HA, or if HA is result of elevated BP.  Per consult with Dr. Alesia Richards: Saline lock restart PIH labs Hydralazine 5 mg initially, then 10 mg in 20 min if BP still elevated. Reglan 10 mg IV Toradol 30 mg IV If HA persists, may need CT imaging. Restart I&O  Donnel Saxon, CNM 03/12/15 5:15p

## 2015-03-13 ENCOUNTER — Other Ambulatory Visit: Payer: Self-pay | Admitting: Cardiology

## 2015-03-13 DIAGNOSIS — I442 Atrioventricular block, complete: Secondary | ICD-10-CM

## 2015-03-13 MED ORDER — FERROUS SULFATE 325 (65 FE) MG PO TABS: 325.0000 mg | ORAL_TABLET | Freq: Every day | ORAL | Status: DC

## 2015-03-13 MED ORDER — IBUPROFEN 600 MG PO TABS: 600.0000 mg | ORAL_TABLET | Freq: Four times a day (QID) | ORAL | Status: DC | PRN

## 2015-03-13 MED ORDER — OXYCODONE-ACETAMINOPHEN 5-325 MG PO TABS: 1.0000 | ORAL_TABLET | Freq: Four times a day (QID) | ORAL | Status: DC | PRN

## 2015-03-13 NOTE — Progress Notes (Signed)
UR chart review completed.  

## 2015-03-13 NOTE — Lactation Note (Signed)
This note was copied from the chart of [place]. Lactation Consultation Note  Follow up visit made.  Mom is pumping every 3 hours and obtaining 50-80 mls per breast.  Denies breast discomfort.  No questions at present time.  Encouraged to call for concerns/assist prn.  Patient Name: Laurie Phillips [id number] Date: 03/13/2015     Maternal Data    Feeding Feeding Type: Breast Milk Length of feed: 30 min  LATCH Score/Interventions                      Lactation Tools Discussed/Used     Consult Status      Ave Filter 03/13/2015, 3:07 PM

## 2015-03-13 NOTE — Care Management Note (Signed)
    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   Anticipated DC Date:  03/13/2015   Anticipated DC Plan:  [place]  CM consult      [facility] Choice  HOME HEALTH   Choice offered to / List presented to:  Patient       [place] arranged  HH-1 RN      [place].   Status of service:  Completed, signed off  Discharge Disposition:  [place]  Comments:  03/13/15  1400p  Notified by pt's Nurse of need for [facility] for weekly BP check x 3 weeks.  Spoke w/ pt and husband at bedside in room 372.  Discussed HHC and agencies. Husband's Mother is a [facility] and works for [employer].  He spoke w/ his Mother about [place] for the pt and that agency does not do [place] for post partum pt's or infant's.  Per pt's husband there is no preference to which [facility] agency is used. Verified home address as correct and phone number listed is pt's cell numbe on face sheetr.  Husband's cell number is [phone].  Referral made to [place] - who verified that they would be able to see the pt for BP checks as requested.  Notified pt's Nurse that [facility] is in place.  CM available to assist as needed. [place], [place]

## 2015-03-13 NOTE — Progress Notes (Signed)
Pt to follow up with Dr. Caryl Comes for holster monitor, per DR. Lovena Le. Dr.Kulwa to discharge home. Discharge instructions reviewed, no questions from pt.

## 2015-03-13 NOTE — Progress Notes (Signed)
Subjective: Postpartum Day 5: Cesarean Delivery Patient reports no complaints. Denies chest pain or shortness of breath, nausea or vomiting.  Pain well controlled.  Headache earlier resolved.   Objective: Vital signs in last 24 hours: Temp:  [97.9 F (36.6 C)-98.7 F (37.1 C)] 97.9 F (36.6 C) (05/02 0800) Pulse Rate:  [37-45] 45 (05/02 1300) Resp:  [9-22] 19 (05/02 1300) BP: (133-177)/(64-85) 142/85 mmHg (05/02 0800) SpO2:  [97 %-100 %] 99 % (05/02 1300) Weight:  [183 lb 4.8 oz (83.144 kg)] 183 lb 4.8 oz (83.144 kg) (05/02 0446)  Physical Exam:  General: alert, cooperative and no distress Lochia: appropriate Uterine Fundus: firm Incision: with dressing c/d/i DVT Evaluation: warm and well perfused.  3+ edema BL, no calf tenderness.    Recent Labs  03/12/15 1718  HGB 9.8*  HCT 30.1*    Assessment/Plan: Status post Cesarean section. Postoperative course complicated by complete heart block, cardiology following  -Will be discharged home with outpatient holter monitoring and outpatient  follow up with cardiology.  -visiting nurse service for BP check in 1 week, weekly x 3 weeks.  -Plan for office visit in 2 weeks.   [facility] 03/13/2015, 1:56 PM

## 2015-03-13 NOTE — Discharge Summary (Addendum)
Obstetric Discharge Summary Reason for Admission: Preeclampsia with severe features.  Prenatal Procedures: none Intrapartum Procedures: cesarean: low cervical, transverse Postpartum Procedures: Cardiac monitoring Complications-Operative and Postpartum: Complete heart block HEMOGLOBIN  Date Value Ref Range Status  03/12/2015 9.8* 12.0 - 15.0 g/dL Final   HCT  Date Value Ref Range Status  03/12/2015 30.1* 36.0 - 46.0 % Final    Physical Exam:  General: alert, cooperative and no distress  CVS: s1, s2, RRR Pulm: CTAB Abd: S/NT/ND/+BS Lochia: appropriate Uterine Fundus: firm Incision: healing well, with dressing c/d/i DVT Evaluation: No evidence of DVT seen on physical exam. Calf/Ankle edema is present.  Discharge Diagnoses: Preelampsia and complete heart block.   Cesaren delivery due to fetal intolerance of labor.  Preeclampsia with severe features.    Hospital Course/Discharge:  Patient presented to the hospital for induction of labor for preeclampsia with severe features at about [redacted] weeks  EGA. She required a cesarean delivery for abnormal fetal heart tracing and fetal intolerance of labor induction at 1 cm.  She delivered a viable female infant. Her postoperative course was significant for complete heart block. Patient had cardiology consultation and also continuous cardiac monitoring in the AICU. She received magnesium sulfate in the intrapartum period and also for about 10 hours postpartum. The magnesium sulfate was stopped early because of concerns of the heart block.  During her entire hospital stay with a heart block she did not have any complaints of chest pain or shortness of breath or palpitations.  Her pulse would be in the 30-50 range.  On postoperative day #6 she was thought to be stable and was cleared for discharge by cardiology. Cardiology advised her to follow-up at the office for continuous Holter monitoring placement and also continued outpatient follow-up with them.  Visiting nurse service was arranged for her to check her blood pressure.  She was advised to follow-up at [facility] for blood pressure check and also incision check in 2 weeks.    Discharge Information: Date: 03/13/2015 Activity: pelvic rest and no heavy lifting.  Diet: Cardiac, low sodium diet. Medications: Ibuprofen, Iron and Percocet Condition: stable Instructions: refer to practice specific booklet and Postpartum care after c/section.  Discharge to: home Follow-up Information    Follow up with Cristopher Peru, MD.   Specialty:  Cardiology   Why:  the office will call with date and time   Contact information:   [address] [place] [address] [phone]       Newborn Data: Live born female  Birth Weight: 5 lb 11.4 oz (2591 g) APGAR: 9, 9 Baby was not discharged on 5/2 because of feeding issues.  Patient plans for in-office circumcision.   Home with partner.   [facility] 03/13/2015, 4:11 PM

## 2015-03-13 NOTE — Discharge Instructions (Signed)
You will go to Dr. Tanna Furry office on Tuesday the 3rd of May and have monitor placed.    Do not drive.

## 2015-03-14 ENCOUNTER — Telehealth: Payer: Self-pay

## 2015-03-14 ENCOUNTER — Encounter: Payer: Self-pay | Admitting: *Deleted

## 2015-03-14 ENCOUNTER — Ambulatory Visit (INDEPENDENT_AMBULATORY_CARE_PROVIDER_SITE_OTHER): Payer: Medicaid Other

## 2015-03-14 DIAGNOSIS — I442 Atrioventricular block, complete: Secondary | ICD-10-CM

## 2015-03-14 NOTE — Telephone Encounter (Signed)
Patient seen in hospital by Dr Lovena Le  She needs cardiac MRI ASAP for me to read r/o infiltrative disease/sarcoid And f/u with him ASAP to arrange pacer as outpatient See if you can schedule MRI for this week for me to read

## 2015-03-14 NOTE — Telephone Encounter (Signed)
Patient agrees with treatment plan.  Cardiac MRI ordered for scheduling ASAP for Dr. Johnsie Cancel to read. Patient scheduled with Dr. Caryl Comes 5/9.

## 2015-03-14 NOTE — Telephone Encounter (Signed)
Laurie Phillips from [place] called with critical notification.  At baseline, patient was in 3rd degree heart block with HR of 34. Spoke with patient, who reports no symptoms at all. Strips given to Dr. Mare Ferrari for review. Since patient is asymptomatic, we are to continue monitoring at this time. Instructed patient to call the office if she experiences any symptoms at all.

## 2015-03-14 NOTE — Progress Notes (Signed)
Patient ID: Laurie Phillips, female   DOB: 20-Jan-1991, 24 y.o.   MRN: [id number] [employer] guardian 30 day cardiac event monitor applied to patient.

## 2015-03-15 ENCOUNTER — Telehealth: Payer: Self-pay | Admitting: Cardiovascular Disease

## 2015-03-15 ENCOUNTER — Telehealth: Payer: Self-pay

## 2015-03-15 NOTE — Telephone Encounter (Signed)
Spoke with pt she is feeling fine this morning. Reminded of appt with Dr.Klein on 5/9.  Pt reminded that we will call to check on her if events are inidcated on her monitor.  Pt expressed understanding and no questions at this time.

## 2015-03-15 NOTE — Telephone Encounter (Signed)
New Message  Pt called states that she received a call to return a call first thing 03/15/2015. Pt states that she is aware of her appt but she is not sure if it is something else. Please call back with clarification.

## 2015-03-15 NOTE — Telephone Encounter (Signed)
Aaron Edelman from [facility] called about patient's monitor. He reports that she had 2nd degree heart block with HR 34 at 7 am this morning. Patient was called and was asymptomatic. He will be sending a fax of these report to our office. Dr. Mare Ferrari is aware.

## 2015-03-16 ENCOUNTER — Ambulatory Visit: Payer: Self-pay

## 2015-03-16 ENCOUNTER — Telehealth: Payer: Self-pay | Admitting: Internal Medicine

## 2015-03-16 ENCOUNTER — Telehealth: Payer: Self-pay | Admitting: *Deleted

## 2015-03-16 NOTE — Telephone Encounter (Signed)
Informed [place] nurse that we have changed parameters on HR so that monitoring company does not call unless HR drops below 30. Explained that I would review BP issues with Dr. Caryl Comes later today (as we are in clinic now) and I will call patient with recommendations. Cheryl with [facility] verbalized understanding.

## 2015-03-16 NOTE — Lactation Note (Signed)
This note was copied from the chart of [place]. Lactation Consultation Note  Assisted mom with feeding this PM.  Breasts are full and milk supply abundant.  Assisted mom with positioning baby in cross cradle hold.  Baby opens and latches easily and deeply to breast.  Baby nursed actively with many audible swallows.  Breast softened significantly.  Baby now ad lib and mom encouraged to alternate breasts each feeding.  Encouraged to call with concerns prn.  Patient Name: Laurie Phillips HFSFS'E Date: 03/16/2015 Reason for consult: Follow-up assessment;NICU baby   Maternal Data    Feeding Feeding Type: Breast Fed Length of feed: 20 min  LATCH Score/Interventions Latch: Grasps breast easily, tongue down, lips flanged, rhythmical sucking. Intervention(s): Skin to skin;Teach feeding cues;Waking techniques Intervention(s): Breast compression;Breast massage;Assist with latch;Adjust position  Audible Swallowing: Spontaneous and intermittent Intervention(s): Alternate breast massage  Type of Nipple: Everted at rest and after stimulation  Comfort (Breast/Nipple): Soft / non-tender     Hold (Positioning): Assistance needed to correctly position infant at breast and maintain latch. Intervention(s): Breastfeeding basics reviewed;Support Pillows;Position options;Skin to skin  LATCH Score: 9  Lactation Tools Discussed/Used     Consult Status Consult Status: Follow-up Date: 03/17/15 Follow-up type: In-patient    [facility] 03/16/2015, 4:03 PM

## 2015-03-16 NOTE — Telephone Encounter (Signed)
Dwayne from [place] monitoring [employer] [phone]) called to report an early AM event of 3rd degree heartblock @0600 . [employer] states this patient continues to go back and forth between 2nd degree and 3rd degree heartblock. They spoke with Dr. Aundra Dubin last night at 2200 regarding an event. Patient was reporting she was asymptomatic. They have not been able to reach her yet this morning, however patient did call in to Dr. Olin Pia office. Obtained written tracings showing 1st, 2nd and 3rd degree heartblock over past couple days. Reviewed at length by Dr. Caryl Comes. Advised to have [facility] adjust Alert Rate down to 30. Completed.  Dwayne/[employer] is wanting to know if they should continue to report each event of heartblock. Advised that I will need to specifically ask the physician and will be back in touch with [employer] with this information. Called Dwayne/[employer] back to inform him of the Alert Rate adjustment down to 30. Dwayne stated they will notify us for 3rd degree heartblock that is symptomatic or below 30. Routed to Dr. Tomma Lightning. Johnsie Cancel and Trinidad Curet, RN.

## 2015-03-16 NOTE — Telephone Encounter (Signed)
New message   In the home now.    Pt C/O BP issue: STAT if pt C/O blurred vision, one-sided weakness or slurred speech  1. What are your last 5 BP readings? Recently had a baby   Today   Vital sign 160/90 left /  170/90 -right /  heart rate  40 , 99, 96.7   2. Are you having any other symptoms (ex. Dizziness, headache, blurred vision, passed out)? No   3. What is your BP issue?  Preeclampsia ,. Went into complete heart block , fit in the office on yesterday for monitor.    Was advice to call the office with elevated blood pressure

## 2015-03-17 ENCOUNTER — Encounter: Payer: Self-pay | Admitting: Cardiovascular Disease

## 2015-03-17 ENCOUNTER — Ambulatory Visit (HOSPITAL_COMMUNITY): Payer: Medicaid Other

## 2015-03-17 NOTE — Telephone Encounter (Signed)
This is an issue for Dr Caryl Comes and Lovena Le

## 2015-03-18 ENCOUNTER — Telehealth: Payer: Self-pay | Admitting: Physician Assistant

## 2015-03-18 NOTE — Telephone Encounter (Addendum)
Patient has been in/out of second-degree and third-degree heart block, not new.  This a.m., her heart rate got down in the 20s. They contacted the patient and she had been asleep, asymptomatic.  05/08 2 additional episodes of CHB w/ HR 20s, both while patient was asleep or resting, asymptomatic.

## 2015-03-19 ENCOUNTER — Telehealth: Payer: Self-pay | Admitting: Internal Medicine

## 2015-03-19 NOTE — Telephone Encounter (Signed)
I've got a call from [employer] as monitoring company regarding Laurie Phillips having complete heart block with heart rate down to 25. I have called the patient she was completely asymptomatic. Apparently heart block that occurred while she was laying down and resting. Patient denied chest pain, shortness of breath, dizziness or lightheadedness, nausea. I have advised the patient if she has any hint of the symptoms described above she goes to the emergency department immediately. Advised her against driving. She has an appointment with Dr. Caryl Comes at 11 AM on 03/20/2015 which is tomorrow. Unfortunately I do not have the recordings from [employer] howeve reviewing patient's previous EKG she has narrow complex QRS suggestive of escape rhythm from AV junction rather than low in the His-Purkinje system. Patient was offered the option of coming into the hospital tonight she felt comfortable staying at home and proceeding with schedule for up appointment.

## 2015-03-20 ENCOUNTER — Telehealth: Payer: Self-pay | Admitting: Cardiology

## 2015-03-20 ENCOUNTER — Encounter: Payer: Self-pay | Admitting: Internal Medicine

## 2015-03-20 ENCOUNTER — Ambulatory Visit (INDEPENDENT_AMBULATORY_CARE_PROVIDER_SITE_OTHER): Payer: Medicaid Other | Admitting: Internal Medicine

## 2015-03-20 VITALS — BP 120/72 | HR 40 | Ht 62.0 in | Wt 169.4 lb

## 2015-03-20 DIAGNOSIS — R202 Paresthesia of skin: Secondary | ICD-10-CM

## 2015-03-20 DIAGNOSIS — I442 Atrioventricular block, complete: Secondary | ICD-10-CM | POA: Diagnosis not present

## 2015-03-20 DIAGNOSIS — R200 Anesthesia of skin: Secondary | ICD-10-CM

## 2015-03-20 NOTE — Patient Instructions (Signed)
Medication Instructions:  Your physician recommends that you continue on your current medications as directed. Please refer to the Current Medication list given to you today.  Labwork: None ordered  Testing/Procedures: You have been referred to Neurology for right hand numbness  Follow-Up: To be determined after neurology consult.  Thank you for choosing [facility]!!

## 2015-03-20 NOTE — Telephone Encounter (Signed)
Service called- pt had a run of 2:1 AVB with HR 30. It lasted one minute. No symptoms recorded. Serve al asymptomatic episodes noted over the weekend. The pt has an appointment with Dr Caryl Comes 11 am.   Kerin Ransom PA-C 03/20/2015 7:20 AM

## 2015-03-20 NOTE — Progress Notes (Signed)
Electrophysiology Office Note   Date:  03/20/2015   ID:  Laurie Phillips, DOB Nov 27, 1990, MRN [id number]  PCP:  Abigail Miyamoto, MD  Cardiologist:  PN Primary Electrophysiologist:    Virl Axe, MD    Chief Complaint  Patient presents with  . New Evaluation    complete heart block     History of Present Illness: Laurie Phillips is a 24 y.o. female    who was seen at [facility] a couple weeks ago following the delivery of her first child. She was in complete heart block. She been noted to have significant first degree AV block 3 or 4 months ago. Evaluation [facility] demonstrated normal LV function. Lyme titers Ace level were normal and there is no CT evidence of hilar adenopathy.  She comes in today with her son and her husband.   Today, she denies  symptoms of  chest pain, shortness of breath, orthopnea, PND, lower extremity edema, bleeding, or neurologic sequela.  she has no  complaints of palpitations, lightheadedness presyncope or syncope  She is thought loss about this pacemaker and is now agreeable to proceeding.    Past Medical History  Diagnosis Date  . Headache   . Fibroid   . Sinus bradycardia   . Chest pain   . First degree AV block   . Bipolar 1 disorder   . Generalized anxiety disorder   . Rh negative, antepartum   . Fibroid   . Attention deficit hyperactivity disorder   . Pneumonia    Past Surgical History  Procedure Laterality Date  . Tonsillectomy    . Knee arthroscopy Right   . Cesarean section N/A 03/08/2015    Procedure: CESAREAN SECTION;  Surgeon: Everett Graff, MD;  Location: [facility];  Service: Obstetrics;  Laterality: N/A;     Current Outpatient Prescriptions  Medication Sig Dispense Refill  . ferrous sulfate 325 (65 FE) MG tablet Take 1 tablet (325 mg total) by mouth daily. 30 tablet 1  . ibuprofen (ADVIL,MOTRIN) 600 MG tablet Take 1 tablet (600 mg total) by mouth every 6 (six) hours as needed. Abdominal pain 40 tablet 0  .  oxyCODONE-acetaminophen (PERCOCET/ROXICET) 5-325 MG per tablet Take 1 tablet by mouth every 6 (six) hours as needed (for pain scale greater than 7). 30 tablet 0  . Prenatal Vit-Fe Fumarate-FA (PRENATAL MULTIVITAMIN) TABS tablet Take 1 tablet by mouth at bedtime.     No current facility-administered medications for this visit.    Allergies:   Review of patient's allergies indicates no known allergies.   Social History:  The patient  reports that she has never smoked. She has never used smokeless tobacco. She reports that she does not drink alcohol or use illicit drugs.   Family History:  The patient's She was adopted. Family history is unknown by patient.    ROS:  Please see the history of present illness.   Otherwise, review of systems is negative.    PHYSICAL EXAM: VS:  BP 120/72 mmHg  Pulse 40  Ht 5\' 2"  (1.575 m)  Wt 169 lb 6.4 oz (76.839 kg)  BMI 30.98 kg/m2  SpO2 94%  LMP 06/28/2014  Breastfeeding? Yes , BMI Body mass index is 30.98 kg/(m^2). GEN: Well nourished, well developed, in no acute distress HEENT: normal Neck: JVD flat, carotid bruits, or masses Cardiac: IR RR; no murmur  rubs, noS4  Respiratory:  clear to auscultation bilaterally, normal work of breathing Back without kyphosis or CVAT GI: soft, nontender, nondistended, +  BS MS: no deformity or atrophy Skin: warm and dry,   Extremities No Clubbing cyanosis  Edema Neuro:  Strength and sensation are intact Psych: euthymic mood, full affect  EKG:  EKG is ordered today. The ekg ordered today shows sinus bradycardia at a rate of 55 with complete heart block with a narrow QRS and a ventricular rate of 38   Recent Labs: 10/14/2014: TSH 1.250 03/08/2015: Magnesium 6.7* 03/12/2015: ALT 52; BUN 17; Creatinine 0.76; Hemoglobin 9.8*; Platelets 188; Potassium 4.8; Sodium 141    Lipid Panel  No results found for: CHOL, TRIG, HDL, CHOLHDL, VLDL, LDLCALC, LDLDIRECT   Wt Readings from Last 3 Encounters:  03/20/15 169 lb  6.4 oz (76.839 kg)  03/13/15 183 lb 4.8 oz (83.144 kg)  10/19/14 137 lb 1.9 oz (62.197 kg)      Other studies Reviewed: Additional studies/ records that were reviewed today include: none  Review of the above records today demonstrates:     ASSESSMENT AND PLAN:  Complete heart block  The patient has ongoing complete heart block. Anticipated evaluation will include MRI scanning to look for infiltrative diseases. I have spoke with [name] at [facility] regarding His Bundle pacing. I will forward contact information and he will arrange for proceeding with pacing. I have reviewed risks and benefits with the patient is released endovascular procedures but not the specifics of His bundle pacing   Current medicines are reviewed at length with the patient today.   The patient  concerns regarding her medicines.  The following changes were made today:    Labs/ tests ordered today include:  No orders of the defined types were placed in this encounter.   We spent more than 50% of our >55 min visit in face to face counseling regarding the above   Disposition:   FU with me following pacemaker implantation.  Signed, Virl Axe, MD  03/20/2015 12:08 PM     [facility] [address] [place] [address] [phone] (office) [phone] (fax)

## 2015-03-21 ENCOUNTER — Telehealth: Payer: Self-pay | Admitting: *Deleted

## 2015-03-21 ENCOUNTER — Telehealth: Payer: Self-pay | Admitting: Internal Medicine

## 2015-03-21 NOTE — Telephone Encounter (Signed)
Received a Critical Notification from [place] in regards to pt's heart monitor. Called pt and she states that at the time of the event she was sleeping. Pt denies any symptoms and states that she feels fine. Spoke with Dr. Caryl Comes in regards to strip received and Dr. Caryl Comes states that he is aware of pt's AVB and ask that I call pt and inform her that [name] from [facility] would be contacting her in regards to a pacemaker. Spoke with pt and informed her of information given by Dr. Caryl Comes. Pt verbalized understanding.

## 2015-03-21 NOTE — Telephone Encounter (Signed)
I have got notification from [employer] and patient's heart rate of 22-25, AV block 2:1. Attempts to reach the patient via the phone were unsuccessful.

## 2015-03-21 NOTE — Telephone Encounter (Signed)
Received Critical Notification from [place] Monitor. Recorded event for 11:12pm and 11:55pm CST 2:1 AV Block. Spoke with pt and she states that at the time of the events she was either sleeping or breastfeeding. Pt denies any dizziness, lightheadedness or SOB. Pt states she "feels fine". Strips shown to Dr. Burt Knack, [place]

## 2015-03-22 ENCOUNTER — Telehealth: Payer: Self-pay | Admitting: Internal Medicine

## 2015-03-22 ENCOUNTER — Telehealth: Payer: Self-pay | Admitting: *Deleted

## 2015-03-22 NOTE — Telephone Encounter (Signed)
Received a Critical Notification from [place] in regards to pt's heart monitor and "critical EKG" which shows AVB with HR 28 bpm.   Called pt and she states that at the time of the event she was sleeping. Pt denies any symptoms and states that she feels fine. Patient is awaiting to hear from [name] from [facility] in regards to a pacemaker.

## 2015-03-22 NOTE — Telephone Encounter (Signed)
Critical EKG called in from [employer] - 3rd Degree Heartblock with rate of 26. They attempted to call her but there was no answer or VM. Routing to Trinidad Curet, Therapist, sports, and Dr. Caryl Comes.

## 2015-03-22 NOTE — Telephone Encounter (Signed)
Spoke with Trinidad Curet, RN, to report information below. She verbalized understanding of information.

## 2015-03-22 NOTE — Telephone Encounter (Signed)
Advised patient to stop event monitor and mail it back per instructions. Patient verbalized understanding and agreeable to plan.  Also informed her that I would contact Dr. Lynnda Phillips office to expedite follow up at [facility].

## 2015-03-27 ENCOUNTER — Ambulatory Visit (INDEPENDENT_AMBULATORY_CARE_PROVIDER_SITE_OTHER): Payer: Medicaid Other | Admitting: Neurology

## 2015-03-27 ENCOUNTER — Telehealth: Payer: Self-pay | Admitting: Internal Medicine

## 2015-03-27 ENCOUNTER — Encounter: Payer: Self-pay | Admitting: Neurology

## 2015-03-27 VITALS — BP 92/60 | HR 52 | Ht 62.0 in | Wt 168.0 lb

## 2015-03-27 DIAGNOSIS — R208 Other disturbances of skin sensation: Secondary | ICD-10-CM | POA: Diagnosis not present

## 2015-03-27 DIAGNOSIS — R200 Anesthesia of skin: Secondary | ICD-10-CM

## 2015-03-27 NOTE — Patient Instructions (Signed)
Possibly atypical presentation of carpal tunnel.  I don't suspect anything coming from the brain.

## 2015-03-27 NOTE — Telephone Encounter (Signed)
Patient states that she called Dr. Lynnda Child office in [place], as she had been referred there by Dr. Caryl Comes. She is going to be getting a pacemaker implanted. Dr. Lynnda Child office stated that Dr. Caryl Comes would need to complete a "financial referral" for Medicaid and fax it to: [phone], as soon as possible. Routed to Dr. Caryl Comes and Trinidad Curet, RN.

## 2015-03-27 NOTE — Progress Notes (Signed)
NEUROLOGY CONSULTATION OFFICE NOTE  Laurie Phillips [id number]  HISTORY OF PRESENT ILLNESS: Laurie Phillips is a 24 year old left-handed female with complete heart block who presents for right hand numbness.  Records reviewed.  She is accompanied by her husband who provides some history.  She had a C-section at 36 weeks on 4/27 due to pre-eclampsia.  She had a lot of water weight during her pregnancy.  After her C-section, she noticed numbness in her right palm with certain activity, such as pushing down to lift her body up, holding the phone or holding her baby.  She noted some weakness in the hand when it was numb.  She denied neck pain or pain down the arm.  She denied headache or symptoms involving the legs.  As she has lost her water weight, she has noticed improvement in symptoms and she hasn't experienced it in the past 2 to 3 days.  PAST MEDICAL HISTORY: Past Medical History  Diagnosis Date  . Headache   . Fibroid   . Sinus bradycardia   . Chest pain   . First degree AV block   . Bipolar 1 disorder   . Generalized anxiety disorder   . Rh negative, antepartum   . Fibroid   . Attention deficit hyperactivity disorder   . Pneumonia     MEDICATIONS: Current Outpatient Prescriptions on File Prior to Visit  Medication Sig Dispense Refill  . ferrous sulfate 325 (65 FE) MG tablet Take 1 tablet (325 mg total) by mouth daily. 30 tablet 1  . ibuprofen (ADVIL,MOTRIN) 600 MG tablet Take 1 tablet (600 mg total) by mouth every 6 (six) hours as needed. Abdominal pain 40 tablet 0  . Prenatal Vit-Fe Fumarate-FA (PRENATAL MULTIVITAMIN) TABS tablet Take 1 tablet by mouth at bedtime.     No current facility-administered medications on file prior to visit.    ALLERGIES: No Known Allergies  FAMILY HISTORY: Family History  Problem Relation Age of Onset  . Adopted: Yes  . Family history unknown: Yes    SOCIAL HISTORY: History   Social History  . Marital Status: Married    Spouse  Name: N/A  . Number of Children: N/A  . Years of Education: N/A   Occupational History  . Not on file.   Social History Main Topics  . Smoking status: Former Smoker    Quit date: 11/11/2010  . Smokeless tobacco: Never Used  . Alcohol Use: No  . Drug Use: No  . Sexual Activity: Yes    Birth Control/ Protection: None   Other Topics Concern  . Not on file   Social History Narrative    REVIEW OF SYSTEMS: Constitutional: No fevers, chills, or sweats, no generalized fatigue, change in appetite Eyes: No visual changes, double vision, eye pain Ear, nose and throat: No hearing loss, ear pain, nasal congestion, sore throat Cardiovascular: No chest pain, palpitations Respiratory:  No shortness of breath at rest or with exertion, wheezes GastrointestinaI: No nausea, vomiting, diarrhea, abdominal pain, fecal incontinence Genitourinary:  No dysuria, urinary retention or frequency Musculoskeletal:  No neck pain, back pain Integumentary: No rash, pruritus, skin lesions Neurological: as above Psychiatric: No depression, insomnia, anxiety Endocrine: No palpitations, fatigue, diaphoresis, mood swings, change in appetite, change in weight, increased thirst Hematologic/Lymphatic:  No anemia, purpura, petechiae. Allergic/Immunologic: no itchy/runny eyes, nasal congestion, recent allergic reactions, rashes  PHYSICAL EXAM: Filed Vitals:   03/27/15 1234  BP: 92/60  Pulse: 52   General: No acute distress Head:  Normocephalic/atraumatic  Eyes:  Fundoscopic exam unremarkable without vessel changes, exudates, hemorrhages or papilledema. Neck: supple, no paraspinal tenderness, full range of motion Heart:  Regular rate and rhythm Lungs:  Clear to auscultation bilaterally Back: No paraspinal tenderness Neurological Exam: alert and oriented to person, place, and time. Attention span and concentration intact, recent and remote memory intact, fund of knowledge intact.  Speech fluent and not  dysarthric, language intact.  CN II-XII intact. Fundoscopic exam unremarkable without vessel changes, exudates, hemorrhages or papilledema.  Bulk and tone normal, muscle strength 5/5 throughout.  Sensation to light touch, temperature and vibration intact.  Deep tendon reflexes 2+ throughout, toes downgoing.  Finger to nose and heel to shin testing intact.  Gait normal, Romberg negative.  Tinel's sign negative  IMPRESSION: Intermittent numbness of right palm.  It is not classic distribution for carpal tunnel syndrome, however it does make sense given that it occurred just after her pregnancy and has steadily improved as she loses her water weight.  I don't suspect an intracranial etiology.  PLAN: Since she is doing better, we will forgo further testing.  She may return if symptoms become worse again.  45 minutes spent with patient, over 50% spent discussing likely diagnoses and management.  Metta Clines, DO  CC:  Virl Axe, MD  Briscoe Deutscher, MD

## 2015-03-27 NOTE — Telephone Encounter (Signed)
New message    Patient need a financial  referral  for medicaid .    Dr. Caryl Comes referral patient to [name]  In [place].

## 2015-03-28 ENCOUNTER — Ambulatory Visit (HOSPITAL_COMMUNITY)
Admission: RE | Admit: 2015-03-28 | Discharge: 2015-03-28 | Disposition: A | Discharge status: Home / Self Care | Payer: Medicaid Other | Source: Ambulatory Visit | Attending: Cardiovascular Disease | Admitting: Cardiovascular Disease

## 2015-03-28 ENCOUNTER — Telehealth: Payer: Self-pay | Admitting: Internal Medicine

## 2015-03-28 DIAGNOSIS — I442 Atrioventricular block, complete: Secondary | ICD-10-CM

## 2015-03-28 NOTE — Telephone Encounter (Signed)
Pt calling to see if [name] has talked to Dr. Caryl Comes -off and on had increased over two days-needs referral due to having Medicaid-needs pacemmaker

## 2015-03-28 NOTE — Telephone Encounter (Signed)
Informed patient that we contact [name] directly today and informed him that she did not need financial referral as she has regular medicaid.  Informed patient that I will call their office in the morning to confirm they have made an appt this week for her to be seen. She is relieved with this information and thankful for my help with this.

## 2015-03-28 NOTE — Telephone Encounter (Signed)
Dr. Caryl Comes called [name] and left message explaining patient has regular Medicaid and does not need financial referral.

## 2015-03-29 ENCOUNTER — Encounter (HOSPITAL_COMMUNITY)
Admission: RE | Admit: 2015-03-29 | Discharge: 2015-03-29 | Disposition: A | Discharge status: Home / Self Care | Payer: Medicaid Other | Source: Ambulatory Visit | Attending: Obstetrics and Gynecology | Admitting: Obstetrics and Gynecology

## 2015-03-29 DIAGNOSIS — O923 Agalactia: Secondary | ICD-10-CM | POA: Diagnosis not present

## 2015-03-29 NOTE — Telephone Encounter (Signed)
Followed up with patient who tells me she has appt with [name] this Friday.

## 2015-03-30 ENCOUNTER — Ambulatory Visit (HOSPITAL_COMMUNITY)
Admission: RE | Admit: 2015-03-30 | Discharge: 2015-03-30 | Disposition: A | Discharge status: Home / Self Care | Payer: Medicaid Other | Source: Ambulatory Visit | Attending: Cardiovascular Disease | Admitting: Cardiovascular Disease

## 2015-03-30 ENCOUNTER — Other Ambulatory Visit: Payer: Medicaid Other | Admitting: Cardiovascular Disease

## 2015-03-30 DIAGNOSIS — F419 Anxiety disorder, unspecified: Secondary | ICD-10-CM

## 2015-03-30 DIAGNOSIS — I442 Atrioventricular block, complete: Secondary | ICD-10-CM | POA: Diagnosis not present

## 2015-03-30 MED ORDER — LORAZEPAM 2 MG/ML IJ SOLN
INTRAMUSCULAR | Status: AC
  Administered 2015-03-30: 1 mg
  Filled 2015-03-30: qty 1

## 2015-03-30 MED ORDER — GADOBENATE DIMEGLUMINE 529 MG/ML IV SOLN
25.0000 mL | Freq: Once | INTRAVENOUS | Status: AC
  Administered 2015-03-30: 24 mL via INTRAVENOUS

## 2015-03-30 MED ORDER — LORAZEPAM 2 MG/ML IJ SOLN: 2.0000 mg | Freq: Once | INTRAMUSCULAR | Status: DC

## 2015-04-17 ENCOUNTER — Telehealth: Payer: Self-pay | Admitting: Internal Medicine

## 2015-04-17 NOTE — Telephone Encounter (Signed)
New problem     Need to know status of Home Health certification and plan of care form that was faxed in may.  Please advise.

## 2015-04-17 NOTE — Telephone Encounter (Signed)
SPOKE WITH LINDSEY  RE MESSAGE  NOT  SURE   HOME HEALTH ORDERS  WILL BE  SIGNED  BY  DR Caryl Comes  WILL FORWARD TO [facility]  TO  ADDRESS  LINDSEY AWARE  [facility]ERI IS   OFF TODAY  ./CY

## 2015-04-21 NOTE — Telephone Encounter (Signed)
Follow-Up      Anderson Malta, is calling from [facility]. She is requesting that  Dr. Caryl Comes sign documentation that has been faxed over to [place].

## 2015-04-21 NOTE — Telephone Encounter (Signed)
Anderson Malta from [facility] calling requesting Dr. Caryl Comes sign papers that we faxed over on [facility].  Advised Dr. Caryl Comes not in office today and will not be in office till Tuesday.  Will forward message to him and his nurse Sherri Price,RN.

## 2015-04-28 NOTE — Telephone Encounter (Signed)
Informed her Dr. Caryl Comes did not order [facility] for patient. She verbalized understanding and states this should be going to pt's PCP for signing.  She will get in touch w/ them about these orders.

## 2015-04-29 ENCOUNTER — Encounter (HOSPITAL_COMMUNITY)
Admission: RE | Admit: 2015-04-29 | Discharge: 2015-04-29 | Disposition: A | Discharge status: Home / Self Care | Payer: Medicaid Other | Source: Ambulatory Visit | Attending: Obstetrics and Gynecology | Admitting: Obstetrics and Gynecology

## 2015-04-29 DIAGNOSIS — O923 Agalactia: Secondary | ICD-10-CM | POA: Insufficient documentation

## 2015-05-02 ENCOUNTER — Emergency Department (HOSPITAL_COMMUNITY)
Admission: EM | Admit: 2015-05-02 | Discharge: 2015-05-02 | Disposition: A | Discharge status: Home / Self Care | Payer: Medicaid Other | Attending: Emergency Medicine | Admitting: Emergency Medicine

## 2015-05-02 ENCOUNTER — Emergency Department (HOSPITAL_COMMUNITY): Payer: Medicaid Other

## 2015-05-02 ENCOUNTER — Encounter (HOSPITAL_COMMUNITY): Payer: Self-pay | Admitting: Emergency Medicine

## 2015-05-02 DIAGNOSIS — R079 Chest pain, unspecified: Secondary | ICD-10-CM | POA: Insufficient documentation

## 2015-05-02 DIAGNOSIS — Z8701 Personal history of pneumonia (recurrent): Secondary | ICD-10-CM | POA: Insufficient documentation

## 2015-05-02 DIAGNOSIS — Z87891 Personal history of nicotine dependence: Secondary | ICD-10-CM | POA: Insufficient documentation

## 2015-05-02 DIAGNOSIS — Z86018 Personal history of other benign neoplasm: Secondary | ICD-10-CM | POA: Insufficient documentation

## 2015-05-02 DIAGNOSIS — Z8659 Personal history of other mental and behavioral disorders: Secondary | ICD-10-CM | POA: Diagnosis not present

## 2015-05-02 DIAGNOSIS — Z8679 Personal history of other diseases of the circulatory system: Secondary | ICD-10-CM | POA: Insufficient documentation

## 2015-05-02 DIAGNOSIS — R0602 Shortness of breath: Secondary | ICD-10-CM | POA: Diagnosis not present

## 2015-05-02 DIAGNOSIS — R109 Unspecified abdominal pain: Secondary | ICD-10-CM | POA: Diagnosis not present

## 2015-05-02 HISTORY — DX: Atrioventricular block, complete: I44.2

## 2015-05-02 LAB — BASIC METABOLIC PANEL
Anion gap: 11 (ref 5–15)
BUN: 14 mg/dL (ref 6–20)
CALCIUM: 8.7 mg/dL — AB (ref 8.9–10.3)
CO2: 20 mmol/L — AB (ref 22–32)
Chloride: 107 mmol/L (ref 101–111)
Creatinine, Ser: 0.7 mg/dL (ref 0.44–1.00)
GFR calc Af Amer: >60 mL/min (ref >60)
GFR calc non Af Amer: >60 mL/min (ref >60)
GLUCOSE: 128 mg/dL — AB (ref 65–99)
Potassium: 4 mmol/L (ref 3.5–5.1)
Sodium: 138 mmol/L (ref 135–145)

## 2015-05-02 LAB — CBC
HCT: 38.4 % (ref 36.0–46.0)
Hemoglobin: 12.9 g/dL (ref 12.0–15.0)
MCH: 29.3 pg (ref 26.0–34.0)
MCHC: 33.6 g/dL (ref 30.0–36.0)
MCV: 87.1 fL (ref 78.0–100.0)
PLATELETS: 266 10*3/uL (ref 150–400)
RBC: 4.41 MIL/uL (ref 3.87–5.11)
RDW: 13.5 % (ref 11.5–15.5)
WBC: 8 10*3/uL (ref 4.0–10.5)

## 2015-05-02 LAB — I-STAT TROPONIN, ED: TROPONIN I, POC: 0 ng/mL (ref 0.00–0.08)

## 2015-05-02 MED ORDER — IBUPROFEN 600 MG PO TABS: 600.0000 mg | ORAL_TABLET | Freq: Four times a day (QID) | ORAL | Status: DC | PRN

## 2015-05-02 MED ORDER — ASPIRIN 325 MG PO TABS
325.0000 mg | ORAL_TABLET | ORAL | Status: AC
  Administered 2015-05-02: 325 mg via ORAL
  Filled 2015-05-02: qty 1

## 2015-05-02 MED ORDER — NITROGLYCERIN 0.4 MG SL SUBL
0.4000 mg | SUBLINGUAL_TABLET | SUBLINGUAL | Status: DC | PRN
  Filled 2015-05-02: qty 1

## 2015-05-02 NOTE — Consult Note (Signed)
Referring Physician: No referring provider defined for this encounter. Primary Physician: Abigail Miyamoto, MD Primary Cardiologist: Dr. Caryl Comes Reason for Consultation: Chest pain  HPI: This 24 year old patient who is a patient of Dr. 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 Patient underwent randomized trial pacemaker with leads pacing right atrium and HIS bundle. Patient presented to the ED after she had acute onset of sharp crampy substernal chest pain she was laying in bed. Pain lasted for 10-15 minutes and resolved after patient is doing sitting position in the car on the way to the emergency department. Patient shouldn't receive full dose aspirin on the presentation to the ED. Patient reported trend so breath associated with chest pain. I was not able to reproduce pain by putting patient in supine position during my examination.  Patient denied active symptoms of chest pain, shortness of breath, syncope or presyncope, lower extremity edema, PND or orthopnea, frequent or prolonged palpitations, fever, chills, cough, nausea, vomiting, bowel or bladder disturbances.  Review of Systems:  12 systems were reviewed nad were negative except mentioned in the HPI     Past Medical History  Diagnosis Date  . Headache   . Fibroid   . Sinus bradycardia   . Chest pain   . CHB (complete heart block)     s/p Dual chamber: Atrial and HIS pacemaker placement  . Bipolar 1 disorder   . Generalized anxiety disorder   . Rh negative, antepartum   . Fibroid   . Attention deficit hyperactivity disorder   . Pneumonia     Past Surgical History  Procedure Laterality Date  . Tonsillectomy    . Knee arthroscopy Right   . Cesarean section N/A 03/08/2015    Procedure: CESAREAN SECTION;  Surgeon: Everett Graff, MD;  Location: [facility];  Service: Obstetrics;  Laterality: N/A;      Current Medications:   Infusions:     (Not in a hospital admission)   No Known Allergies  History   Social History  . Marital Status: Married    Spouse Name: N/A  . Number of Children: N/A  . Years of Education: N/A   Occupational History  . Not on file.   Social History Main Topics  . Smoking status: Former Smoker    Quit date: 11/11/2010  . Smokeless tobacco: Never Used  . Alcohol Use: No  . Drug Use: No  . Sexual Activity: Yes    Birth Control/ Protection: None   Other Topics Concern  . Not on file   Social History Narrative    Family History  Problem Relation Age of Onset  . Adopted: Yes  . Family history unknown: Yes   Family Status  Relation Status Death Age  . Mother Alive   . Father Alive   . Sister Alive   . Brother Alive   . Sister Alive   . Sister Alive   . Sister Alive   . Sister Alive   . Brother Alive   . Brother Alive   . Brother Alive     PHYSICAL EXAM: Filed Vitals:   05/02/15 0400  BP: 122/80  Pulse: 60  Temp:   Resp: 18    No intake or output data in the 24 hours ending 05/02/15 0423  General:  Well appearing. No respiratory difficulty HEENT: normal Neck: supple. no JVD. Carotids 2+ bilat; no bruits. No lymphadenopathy or thryomegaly appreciated.  Cor: PMI nondisplaced. Regular rate & rhythm. No rubs, gallops or murmurs. Well-healed pacemaker implantation site Lungs: clear Abdomen: soft, nontender, nondistended. No hepatosplenomegaly. No bruits or masses. Good bowel sounds. Extremities: no cyanosis, clubbing, rash, edema Neuro: alert & oriented x 3, cranial nerves grossly intact. moves all 4 extremities w/o difficulty. Affect pleasant.  Results for orders placed or performed during the hospital encounter of 05/02/15 (from the past 24 hour(s))  CBC     Status: None   Collection Time: 05/02/15  3:52 AM  Result Value Ref Range   WBC 8.0 4.0 - 10.5 K/uL   RBC 4.41 3.87 - 5.11 MIL/uL   Hemoglobin 12.9 12.0 - 15.0  g/dL   HCT 38.4 36.0 - 46.0 %   MCV 87.1 78.0 - 100.0 fL   MCH 29.3 26.0 - 34.0 pg   MCHC 33.6 30.0 - 36.0 g/dL   RDW 13.5 11.5 - 15.5 %   Platelets 266 150 - 400 K/uL  I-stat troponin, ED  (not at [facility])     Status: None   Collection Time: 05/02/15  4:04 AM  Result Value Ref Range   Troponin i, poc 0.00 0.00 - 0.08 ng/mL   Comment 3           Radiology:  Dg Chest Port 1 View  05/02/2015   CLINICAL DATA:  Acute onset of left-sided chest pain. Initial encounter.  EXAM: PORTABLE CHEST - 1 VIEW  COMPARISON:  CT of the chest performed 03/09/2015  FINDINGS: The lungs are well-aerated and clear. There is no evidence of focal opacification, pleural effusion or pneumothorax.  The cardiomediastinal silhouette is normal in size. A pacemaker is noted overlying the left chest wall, with leads ending overlying the right atrium. No acute osseous abnormalities are seen.  IMPRESSION: No acute cardiopulmonary process seen.   Electronically Signed   By: Garald Balding M.D.   On: 05/02/2015 04:13    ECG: Sinus rhythm, 60 bpm, no evidence of PR deviation, normal axis, slightly prolonged QRS, patient is known dual-chamber: Rate atrium and HIS pacemaker ECHO (limited, bedside performed by me): No evidence of pericardial effusion, normal RV size and function, normal LV size and function, inferior vena cava is small and collapses with respiration  ASSESSMENT: #Chest pain Given recent pacemaker implantation all this concern would be given for pericarditis and pericardial effusion however there are no evidence of EKG changes of pericarditis, there is no pericardial effusion on the bedside echocardiogram, chest pain was extremely short lasting and resolved without any medical interventions. Therefore pericarditis is quite unlikely. Patient doesn't have any infiltrates or pneumothoraces on the chest x-ray. Given non-persistent nature of her chest pain things such as pulmonary embolism or aortic dissection would be  extremely unlikely. Patient troponin is negative on presentation, she does not have leukocytosis. Chest pain is very likely noncardiac.  PLAN/DISCUSSION: Please wait for the full blood work out to come back, unless striking abnormalities are detected patient can be discharged. She was recommended to call Dr. Lynnda Child office at [facility] modified about current ER visit  Inez Pilgrim, MD 05/02/2015 4:23 AM

## 2015-05-02 NOTE — Discharge Instructions (Signed)

## 2015-05-02 NOTE — ED Notes (Signed)
Pt reports cp this morning at 01:30 when lying down; pt reports pain is center and left chest oriented with no radiation to arms, neck, or jaw; pt reports extensive cardiac history; pt reports sob and nausea as well

## 2015-05-02 NOTE — ED Provider Notes (Signed)
CSN: [id number]     Arrival date & time 05/02/15  6578 History   First MD Initiated Contact with Patient 05/02/15 0340     Chief Complaint  Patient presents with  . Chest Pain    (Consider location/radiation/quality/duration/timing/severity/associated sxs/prior Treatment) HPI Comments: Patient is a 24 year old female who is currently 7.5 weeks postpartum with a history of bradycardia and complete heart block. She is status post pacemaker placement on 04/17/2015. She presents to the emergency department this evening for complaints of chest pain which awoke her from sleep at 2AM. She describes the pain as a cramping/tightening, sharp sensation in her central chest which worsened to a sharp pain radiating through to her back when changing from a supine to an upright position. Patient reports associated shortness of breath. She states that her pain was worse with deep breathing. Pain subsided without intervention  when patient remained upright. she has not tried to lay supine to see if this causes her symptoms to recur. Patient denies associated fever, syncope, chills, nausea, vomiting, diarrhea, or leg swelling. She states that she has not had similar pain in the past. Overall, she has noticed increased energy level since her pacemaker placement. She is followed by [name] of [facility] and Dr. Caryl Comes of [facility].  Patient is a 24 y.o. female presenting with chest pain. The history is provided by the patient. No language interpreter was used.  Chest Pain Associated symptoms: shortness of breath   Associated symptoms: no fever and not vomiting     Past Medical History  Diagnosis Date  . Headache   . Fibroid   . Sinus bradycardia   . Chest pain   . CHB (complete heart block)     s/p Dual chamber: Atrial and HIS pacemaker placement  . Bipolar 1 disorder   . Generalized anxiety disorder   . Rh negative, antepartum   . Fibroid   . Attention deficit hyperactivity disorder   .  Pneumonia    Past Surgical History  Procedure Laterality Date  . Tonsillectomy    . Knee arthroscopy Right   . Cesarean section N/A 03/08/2015    Procedure: CESAREAN SECTION;  Surgeon: Everett Graff, MD;  Location: [facility];  Service: Obstetrics;  Laterality: N/A;   Family History  Problem Relation Age of Onset  . Adopted: Yes  . Family history unknown: Yes   History  Substance Use Topics  . Smoking status: Former Smoker    Quit date: 11/11/2010  . Smokeless tobacco: Never Used  . Alcohol Use: No   OB History    Gravida Para Term Preterm AB TAB SAB Ectopic Multiple Living   2 1 0 1 1 0 1 0 0 1       Review of Systems  Constitutional: Negative for fever and chills.  Respiratory: Positive for chest tightness and shortness of breath.   Cardiovascular: Positive for chest pain. Negative for leg swelling.  Gastrointestinal: Negative for vomiting and diarrhea.  Genitourinary: Negative for dysuria.  Neurological: Negative for syncope.  All other systems reviewed and are negative.   Allergies  Review of patient's allergies indicates no known allergies.  Home Medications   Prior to Admission medications   Medication Sig Start Date End Date Taking? Authorizing Provider  Prenatal Vit-Fe Fumarate-FA (PRENATAL MULTIVITAMIN) TABS tablet Take 1 tablet by mouth at bedtime.   Yes Historical Provider, MD  ferrous sulfate 325 (65 FE) MG tablet Take 1 tablet (325 mg total) by mouth daily. Patient not taking:  Reported on 05/02/2015 03/13/15   Waymon Amato, MD  ibuprofen (ADVIL,MOTRIN) 600 MG tablet Take 1 tablet (600 mg total) by mouth every 6 (six) hours as needed. 05/02/15   Antonietta Breach, PA-C  LORazepam (ATIVAN) 2 MG/ML injection Inject 1 mL (2 mg total) into the vein once. Patient not taking: Reported on 05/02/2015 03/30/15   Josue Hector, MD   BP 103/70 mmHg  Pulse 58  Temp(Src) 97.7 F (36.5 C) (Oral)  Resp 12  SpO2 99%   Physical Exam  Constitutional: She is oriented to person,  place, and time. She appears well-developed and well-nourished. No distress.  Nontoxic/nonseptic appearing, pleasant female.  HENT:  Head: Normocephalic and atraumatic.  Mouth/Throat: Oropharynx is clear and moist. No oropharyngeal exudate.  Eyes: Conjunctivae and EOM are normal. Pupils are equal, round, and reactive to light. No scleral icterus.  Neck: Normal range of motion.  Cardiovascular: Normal rate, regular rhythm and intact distal pulses.   Pulmonary/Chest: Effort normal and breath sounds normal. No respiratory distress. She has no wheezes. She has no rales.  Respirations even and unlabored  Abdominal: Soft. She exhibits no distension. There is tenderness (Mild TTP under xyphoid process. No masses.). There is no rebound and no guarding.  No masses or peritoneal signs.  Musculoskeletal: Normal range of motion.  Neurological: She is alert and oriented to person, place, and time. She exhibits normal muscle tone. Coordination normal.  Skin: Skin is warm and dry. No rash noted. She is not diaphoretic. No erythema. No pallor.  Psychiatric: She has a normal mood and affect. Her behavior is normal.  Nursing note and vitals reviewed.   ED Course  Procedures (including critical care time) Labs Review Labs Reviewed  BASIC METABOLIC PANEL - Abnormal; Notable for the following:    CO2 20 (*)    Glucose, Bld 128 (*)    Calcium 8.7 (*)    All other components within normal limits  CBC  I-STAT TROPOININ, ED    Imaging Review Dg Chest Port 1 View  05/02/2015   CLINICAL DATA:  Acute onset of left-sided chest pain. Initial encounter.  EXAM: PORTABLE CHEST - 1 VIEW  COMPARISON:  CT of the chest performed 03/09/2015  FINDINGS: The lungs are well-aerated and clear. There is no evidence of focal opacification, pleural effusion or pneumothorax.  The cardiomediastinal silhouette is normal in size. A pacemaker is noted overlying the left chest wall, with leads ending overlying the right atrium. No  acute osseous abnormalities are seen.  IMPRESSION: No acute cardiopulmonary process seen.   Electronically Signed   By: Garald Balding M.D.   On: 05/02/2015 04:13     EKG Interpretation   Date/Time:  Tuesday May 02 2015 03:44:26 EDT Ventricular Rate:  60 PR Interval:  106 QRS Duration: 105 QT Interval:  415 QTC Calculation: 415 R Axis:   62 Text Interpretation:  Ectopic atrial rhythm Vent pre-excit'n(WPW), right  access'y pathway Confirmed by Lita Mains  MD, DAVID ([id number]) on 05/02/2015  3:49:15 AM      MDM   Final diagnoses:  Chest pain, unspecified chest pain type    24 year old female with a history of complete heart block, status post pacemaker placement 2 weeks ago, presents to the emergency department for further evaluation of chest pain. Chest pain resolved prior to arrival. She has had no recurrence of symptoms over the last, approximately, 3 hours. Pain is not reproducible on exam and does not recur when patient is lying supine. She has a reassuring  cardiac workup today with a negative troponin. CXR without evidence of acute cardiopulmonary process.  Case discussed with Dr. Ulyses Amor of [facility] in consultation. He has evaluated the patient and performed a bedside ultrasound which shows no evidence of effusion. Low suspicion for pericarditis given resolution of symptoms and inability to reproduce pain when lying supine in the ED. given her reassuring cardiac workup today, Dr. Ulyses Amor believes the patient is appropriate for outpatient management without the need for further ED work up.   Patient advised take ibuprofen as needed for persistent pain. She has also been instructed to touch base with [name] of [facility] regarding her symptoms today. Return precautions discussed and provided. Patient agreeable to plan with no unaddressed concerns. Patient discharged in good condition; VSS.   Filed Vitals:   05/02/15 0415 05/02/15 0430 05/02/15 0445 05/02/15 0515   BP: 102/66 115/71 116/67 103/70  Pulse: 59 60 59 58  Temp:      TempSrc:      Resp: 13 13 15 12   SpO2: 98% 98% 98% 99%     Antonietta Breach, PA-C 05/02/15 0554  Julianne Rice, MD 05/04/15 [phone]

## 2015-05-30 ENCOUNTER — Encounter (HOSPITAL_COMMUNITY)
Admission: RE | Admit: 2015-05-30 | Discharge: 2015-05-30 | Disposition: A | Discharge status: Home / Self Care | Payer: Medicaid Other | Source: Ambulatory Visit | Attending: Obstetrics and Gynecology | Admitting: Obstetrics and Gynecology

## 2015-05-30 DIAGNOSIS — O923 Agalactia: Secondary | ICD-10-CM | POA: Diagnosis not present

## 2015-06-30 ENCOUNTER — Encounter (HOSPITAL_COMMUNITY)
Admission: RE | Admit: 2015-06-30 | Discharge: 2015-06-30 | Disposition: A | Discharge status: Home / Self Care | Payer: Medicaid Other | Source: Ambulatory Visit | Attending: Obstetrics and Gynecology | Admitting: Obstetrics and Gynecology

## 2015-06-30 DIAGNOSIS — O923 Agalactia: Secondary | ICD-10-CM | POA: Insufficient documentation

## 2015-08-01 ENCOUNTER — Encounter (HOSPITAL_COMMUNITY)
Admission: RE | Admit: 2015-08-01 | Discharge: 2015-08-01 | Disposition: A | Discharge status: Home / Self Care | Payer: Medicaid Other | Source: Ambulatory Visit | Attending: Obstetrics and Gynecology | Admitting: Obstetrics and Gynecology

## 2015-08-01 DIAGNOSIS — O923 Agalactia: Secondary | ICD-10-CM | POA: Insufficient documentation

## 2015-08-30 ENCOUNTER — Encounter (HOSPITAL_COMMUNITY)
Admission: RE | Admit: 2015-08-30 | Discharge: 2015-08-30 | Disposition: A | Discharge status: Home / Self Care | Payer: Medicaid Other | Source: Ambulatory Visit | Attending: Obstetrics and Gynecology | Admitting: Obstetrics and Gynecology

## 2015-08-30 DIAGNOSIS — O923 Agalactia: Secondary | ICD-10-CM | POA: Insufficient documentation

## 2015-09-19 ENCOUNTER — Emergency Department (HOSPITAL_COMMUNITY): Payer: Medicaid Other

## 2015-09-19 ENCOUNTER — Encounter (HOSPITAL_COMMUNITY): Payer: Self-pay

## 2015-09-19 ENCOUNTER — Telehealth: Payer: Self-pay | Admitting: Internal Medicine

## 2015-09-19 ENCOUNTER — Emergency Department (HOSPITAL_COMMUNITY)
Admission: EM | Admit: 2015-09-19 | Discharge: 2015-09-19 | Disposition: A | Discharge status: Home / Self Care | Payer: Medicaid Other | Attending: Emergency Medicine | Admitting: Emergency Medicine

## 2015-09-19 DIAGNOSIS — R079 Chest pain, unspecified: Secondary | ICD-10-CM | POA: Insufficient documentation

## 2015-09-19 DIAGNOSIS — Z8659 Personal history of other mental and behavioral disorders: Secondary | ICD-10-CM | POA: Diagnosis not present

## 2015-09-19 DIAGNOSIS — Z86018 Personal history of other benign neoplasm: Secondary | ICD-10-CM | POA: Diagnosis not present

## 2015-09-19 DIAGNOSIS — R202 Paresthesia of skin: Secondary | ICD-10-CM | POA: Diagnosis not present

## 2015-09-19 DIAGNOSIS — Z8701 Personal history of pneumonia (recurrent): Secondary | ICD-10-CM | POA: Insufficient documentation

## 2015-09-19 DIAGNOSIS — I252 Old myocardial infarction: Secondary | ICD-10-CM | POA: Insufficient documentation

## 2015-09-19 DIAGNOSIS — Z87891 Personal history of nicotine dependence: Secondary | ICD-10-CM | POA: Diagnosis not present

## 2015-09-19 LAB — CBC
HEMATOCRIT: 41.4 % (ref 36.0–46.0)
Hemoglobin: 14.3 g/dL (ref 12.0–15.0)
MCH: 29.9 pg (ref 26.0–34.0)
MCHC: 34.5 g/dL (ref 30.0–36.0)
MCV: 86.4 fL (ref 78.0–100.0)
Platelets: 329 10*3/uL (ref 150–400)
RBC: 4.79 MIL/uL (ref 3.87–5.11)
RDW: 12.8 % (ref 11.5–15.5)
WBC: 9.7 10*3/uL (ref 4.0–10.5)

## 2015-09-19 LAB — BASIC METABOLIC PANEL
Anion gap: 10 (ref 5–15)
BUN: 8 mg/dL (ref 6–20)
CALCIUM: 9.2 mg/dL (ref 8.9–10.3)
CO2: 18 mmol/L — AB (ref 22–32)
CREATININE: 0.71 mg/dL (ref 0.44–1.00)
Chloride: 109 mmol/L (ref 101–111)
GFR calc Af Amer: >60 mL/min (ref >60)
GFR calc non Af Amer: >60 mL/min (ref >60)
GLUCOSE: 92 mg/dL (ref 65–99)
Potassium: 4.9 mmol/L (ref 3.5–5.1)
Sodium: 137 mmol/L (ref 135–145)

## 2015-09-19 LAB — I-STAT TROPONIN, ED: Troponin i, poc: 0 ng/mL (ref 0.00–0.08)

## 2015-09-19 NOTE — ED Notes (Signed)
Patient verbalized understanding of discharge instructions and denies any further needs or questions at this time. VS stable.

## 2015-09-19 NOTE — Telephone Encounter (Signed)
Pt c/o of Chest Pain: 1. Are you having CP right now? yes 2. Are you experiencing any other symptoms (ex. SOB, nausea, vomiting, sweating)? SOB OFF AND ON-HEART RACING 3. How long have you been experiencing CP? 1 HOUR  4. Is your CP continuous or coming and going? OFF AND ON 5. Have you taken Nitroglycerin? NO  PT HAD PACER PLACED AT [facility]

## 2015-09-19 NOTE — Telephone Encounter (Signed)
Pt put through to device clinic for CP. She reports her pain is in her "chest cavity" made worse with inspiration, lying down, leaning forward and lifting her son. She reports she had a PPM placed at [facility] in June, she has not been seen in [facility]. Reviewed s/s with Dr. Caryl Comes, he advised she go to the ER for further evaluation of CP. Pt tearful but verbalizes understanding.  Dr. Caryl Comes requests that we make Sharyne Peach aware pt is reporting to ER.

## 2015-09-19 NOTE — ED Notes (Signed)
Pt reports left sided chest pain with radiation to back x 1 hour. Pt had pacemaker placed in June dt 3rd degree heart block. Pt noted to be anxious. Reports mild SOB. Pt is suppose to have new pacemaker placed by Dr. Caryl Comes. Denies swelling.

## 2015-09-19 NOTE — Discharge Instructions (Signed)
Nonspecific Chest Pain  °Chest pain can be caused by many different conditions. There is always a chance that your pain could be related to something serious, such as a heart attack or a blood clot in your lungs. Chest pain can also be caused by conditions that are not life-threatening. If you have chest pain, it is very important to follow up with your health care provider. °CAUSES  °Chest pain can be caused by: °· Heartburn. °· Pneumonia or bronchitis. °· Anxiety or stress. °· Inflammation around your heart (pericarditis) or lung (pleuritis or pleurisy). °· A blood clot in your lung. °· A collapsed lung (pneumothorax). It can develop suddenly on its own (spontaneous pneumothorax) or from trauma to the chest. °· Shingles infection (varicella-zoster virus). °· Heart attack. °· Damage to the bones, muscles, and cartilage that make up your chest wall. This can include: °¨ Bruised bones due to injury. °¨ Strained muscles or cartilage due to frequent or repeated coughing or overwork. °¨ Fracture to one or more ribs. °¨ Sore cartilage due to inflammation (costochondritis). °RISK FACTORS  °Risk factors for chest pain may include: °· Activities that increase your risk for trauma or injury to your chest. °· Respiratory infections or conditions that cause frequent coughing. °· Medical conditions or overeating that can cause heartburn. °· Heart disease or family history of heart disease. °· Conditions or health behaviors that increase your risk of developing a blood clot. °· Having had chicken pox (varicella zoster). °SIGNS AND SYMPTOMS °Chest pain can feel like: °· Burning or tingling on the surface of your chest or deep in your chest. °· Crushing, pressure, aching, or squeezing pain. °· Dull or sharp pain that is worse when you move, cough, or take a deep breath. °· Pain that is also felt in your back, neck, shoulder, or arm, or pain that spreads to any of these areas. °Your chest pain may come and go, or it may stay  constant. °DIAGNOSIS °Lab tests or other studies may be needed to find the cause of your pain. Your health care provider may have you take a test called an ambulatory ECG (electrocardiogram). An ECG records your heartbeat patterns at the time the test is performed. You may also have other tests, such as: °· Transthoracic echocardiogram (TTE). During echocardiography, sound waves are used to create a picture of all of the heart structures and to look at how blood flows through your heart. °· Transesophageal echocardiogram (TEE). This is a more advanced imaging test that obtains images from inside your body. It allows your health care provider to see your heart in finer detail. °· Cardiac monitoring. This allows your health care provider to monitor your heart rate and rhythm in real time. °· Holter monitor. This is a portable device that records your heartbeat and can help to diagnose abnormal heartbeats. It allows your health care provider to track your heart activity for several days, if needed. °· Stress tests. These can be done through exercise or by taking medicine that makes your heart beat more quickly. °· Blood tests. °· Imaging tests. °TREATMENT  °Your treatment depends on what is causing your chest pain. Treatment may include: °· Medicines. These may include: °¨ Acid blockers for heartburn. °¨ Anti-inflammatory medicine. °¨ Pain medicine for inflammatory conditions. °¨ Antibiotic medicine, if an infection is present. °¨ Medicines to dissolve blood clots. °¨ Medicines to treat coronary artery disease. °· Supportive care for conditions that do not require medicines. This may include: °¨ Resting. °¨ Applying heat   or cold packs to injured areas. °¨ Limiting activities until pain decreases. °HOME CARE INSTRUCTIONS °· If you were prescribed an antibiotic medicine, finish it all even if you start to feel better. °· Avoid any activities that bring on chest pain. °· Do not use any tobacco products, including  cigarettes, chewing tobacco, or electronic cigarettes. If you need help quitting, ask your health care provider. °· Do not drink alcohol. °· Take medicines only as directed by your health care provider. °· Keep all follow-up visits as directed by your health care provider. This is important. This includes any further testing if your chest pain does not go away. °· If heartburn is the cause for your chest pain, you may be told to keep your head raised (elevated) while sleeping. This reduces the chance that acid will go from your stomach into your esophagus. °· Make lifestyle changes as directed by your health care provider. These may include: °¨ Getting regular exercise. Ask your health care provider to suggest some activities that are safe for you. °¨ Eating a heart-healthy diet. A registered dietitian can help you to learn healthy eating options. °¨ Maintaining a healthy weight. °¨ Managing diabetes, if necessary. °¨ Reducing stress. °SEEK MEDICAL CARE IF: °· Your chest pain does not go away after treatment. °· You have a rash with blisters on your chest. °· You have a fever. °SEEK IMMEDIATE MEDICAL CARE IF:  °· Your chest pain is worse. °· You have an increasing cough, or you cough up blood. °· You have severe abdominal pain. °· You have severe weakness. °· You faint. °· You have chills. °· You have sudden, unexplained chest discomfort. °· You have sudden, unexplained discomfort in your arms, back, neck, or jaw. °· You have shortness of breath at any time. °· You suddenly start to sweat, or your skin gets clammy. °· You feel nauseous or you vomit. °· You suddenly feel light-headed or dizzy. °· Your heart begins to beat quickly, or it feels like it is skipping beats. °These symptoms may represent a serious problem that is an emergency. Do not wait to see if the symptoms will go away. Get medical help right away. Call your local emergency services (911 in the [place] Do not drive yourself to the hospital. °  °This  information is not intended to replace advice given to you by your health care provider. Make sure you discuss any questions you have with your health care provider. °  °Document Released: 08/07/2005 Document Revised: 11/18/2014 Document Reviewed: 06/03/2014 °Elsevier Interactive Patient Education ©2016 Elsevier Inc. ° °

## 2015-09-19 NOTE — ED Notes (Addendum)
Dr.Knapp at bedside  

## 2015-09-19 NOTE — ED Provider Notes (Signed)
CSN: [id number]     Arrival date & time 09/19/15  1654 History   First MD Initiated Contact with Patient 09/19/15 1916     Chief Complaint  Patient presents with  . Chest Pain   HPI Pt started having chest pain at around 2pm.  It occurred while she was emotionally stressed.    The pain is pressure, sharp and aching in nature.  It was constant for 1 hour.  She has had this before but it usually just lasts a few minutes.  It hurts to inhale as well as bend over.  It hurts when lifting her child.    Past Medical History  Diagnosis Date  . Headache   . Fibroid   . Sinus bradycardia   . Chest pain   . CHB (complete heart block) ([facility])     s/p Dual chamber: Atrial and HIS pacemaker placement  . Bipolar 1 disorder ([place])   . Generalized anxiety disorder   . Rh negative, antepartum   . Fibroid   . Attention deficit hyperactivity disorder   . Pneumonia    Past Surgical History  Procedure Laterality Date  . Tonsillectomy    . Knee arthroscopy Right   . Cesarean section N/A 03/08/2015    Procedure: CESAREAN SECTION;  Surgeon: Everett Graff, MD;  Location: [facility];  Service: Obstetrics;  Laterality: N/A;   Family History  Problem Relation Age of Onset  . Adopted: Yes  . Family history unknown: Yes   Social History  Substance Use Topics  . Smoking status: Former Smoker    Quit date: 11/11/2010  . Smokeless tobacco: Never Used  . Alcohol Use: No   OB History    Gravida Para Term Preterm AB TAB SAB Ectopic Multiple Living   2 1 0 1 1 0 1 0 0 1      Review of Systems  Constitutional: Negative for fever.  Respiratory: Negative for cough.   Neurological:       Tingling in the finger tips and she was seeing spots  All other systems reviewed and are negative.     Allergies  Review of patient's allergies indicates no known allergies.  Home Medications   Prior to Admission medications   Not on File   BP 128/81 mmHg  Pulse 61  Temp(Src) 98 F (36.7 C) (Oral)  Resp 16  SpO2  98%  LMP 09/19/2015 Physical Exam  Constitutional: She appears well-developed and well-nourished. No distress.  HENT:  Head: Normocephalic and atraumatic.  Right Ear: External ear normal.  Left Ear: External ear normal.  Eyes: Conjunctivae are normal. Right eye exhibits no discharge. Left eye exhibits no discharge. No scleral icterus.  Neck: Neck supple. No tracheal deviation present.  Cardiovascular: Normal rate, regular rhythm and intact distal pulses.   Pulmonary/Chest: Effort normal and breath sounds normal. No stridor. No respiratory distress. She has no wheezes. She has no rales.  Abdominal: Soft. Bowel sounds are normal. She exhibits no distension. There is no tenderness. There is no rebound and no guarding.  Musculoskeletal: She exhibits no edema or tenderness.  Neurological: She is alert. She has normal strength. No cranial nerve deficit (no facial droop, extraocular movements intact, no slurred speech) or sensory deficit. She exhibits normal muscle tone. She displays no seizure activity. Coordination normal.  Skin: Skin is warm and dry. No rash noted.  Psychiatric: She has a normal mood and affect.  Nursing note and vitals reviewed.   ED Course  Procedures (including critical care  time) Labs Review Labs Reviewed  BASIC METABOLIC PANEL - Abnormal; Notable for the following:    CO2 18 (*)    All other components within normal limits  CBC  I-STAT TROPOININ, ED    Imaging Review Dg Chest 2 View  09/19/2015  CLINICAL DATA:  Pt reports left sided chest pain with radiation to back x 1 hour. Pt had pacemaker placed in June dt 3rd degree heart block. EXAM: CHEST  2 VIEW COMPARISON:  05/02/2015 FINDINGS: Left-sided transvenous pacemaker leads to the right atrium and unchanged. Heart size is normal. There are no focal consolidations or pleural effusions. No pulmonary edema. IMPRESSION: No active cardiopulmonary disease. Electronically Signed   By: Nolon Nations M.D.   On:  09/19/2015 17:49   I have personally reviewed and evaluated these images and lab results as part of my medical decision-making.   EKG Interpretation   Date/Time:  Tuesday September 19 2015 17:07:46 EST Ventricular Rate:  61 PR Interval:  158 QRS Duration: 156 QT Interval:  498 QTC Calculation: 501 R Axis:   73 Text Interpretation:  AV dual-paced rhythm Abnormal ECG No significant  change since last tracing Confirmed by Elise Knobloch  MD-J, Acelin Ferdig ([id number]) on  09/19/2015 6:36:52 PM      MDM   Final diagnoses:  Chest pain, unspecified chest pain type    Patient's laboratory tests and x-rays were normal. Patient's EKG shows that she is being paced appropriately.    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  However, I suspect her symptoms are today were related to anxiety and stress.  Patient appears stable to follow up with her cardiologist as an outpatient.    Dorie Rank, MD 09/19/15 [phone]

## 2015-09-20 ENCOUNTER — Telehealth: Payer: Self-pay | Admitting: Internal Medicine

## 2015-09-20 DIAGNOSIS — R079 Chest pain, unspecified: Secondary | ICD-10-CM

## 2015-09-20 NOTE — Telephone Encounter (Signed)
I spoke with the pt and she contacted our office yesterday with chest pain and heart racing, Dr Caryl Comes recommended that she go to the ER. The pt did go to the ER as advised and by the time that she was seen she was no longer having chest pain or fast pulse.  The ER felt like her symptoms were related to stress and anxiety. The pt calls the office today because she has continued to have chest pain with inhalation, rolling over and sitting up. The pt also complains of her hands going numb.  The pt is anxious on the phone.  The pt has not taken any OTC medications for her chest pain. The pt recently had a baby in April and she is not breastfeeding/pumping at this time. I made the pt aware that I will contact her after speaking with a physician.

## 2015-09-20 NOTE — Telephone Encounter (Signed)
I spoke with the pt and made her aware of recommendations. The pt has Ibuprofen at home and she will start this medication.  Echo scheduled tomorrow at 9:30.  The pt knows to contact the office if she continues to have symptoms.

## 2015-09-20 NOTE — Telephone Encounter (Signed)
I discussed this pt with Dr Tamala Julian (DOD) and he recommended that the pt start (OTC) Ibuprofen 400mg  three times a day with food and have an echo performed. He would like the pt to touch base with our office in 24-48 hours after starting Ibuprofen and if no improvement in symptoms should arrange further follow-up with Dr Caryl Comes. i left a message on the pt's voicemail to contact the office.

## 2015-09-20 NOTE — Telephone Encounter (Signed)
Pt c/o of Chest Pain: STAT if CP now or developed within 24 hours  1. Are you having CP right now? No, hurts to bend forward and to inhale- went to hosp- CP for over an hour  2. Are you experiencing any other symptoms (ex. SOB, nausea, vomiting, sweating)? Seeing spots, numb hands, SOB,   3. How long have you been experiencing CP? Worst- yesterday   4. Is your CP continuous or coming and going? Comes/goes  5. Have you taken Nitroglycerin? No  ?

## 2015-09-20 NOTE — Telephone Encounter (Signed)
F/u   Pt returning a call from nurse, she was left a message and told to call office but she didn't know the name of nurse.

## 2015-09-21 ENCOUNTER — Other Ambulatory Visit: Payer: Self-pay

## 2015-09-21 ENCOUNTER — Ambulatory Visit (HOSPITAL_COMMUNITY): Payer: Medicaid Other | Attending: Cardiology

## 2015-09-21 DIAGNOSIS — Z87891 Personal history of nicotine dependence: Secondary | ICD-10-CM | POA: Insufficient documentation

## 2015-09-21 DIAGNOSIS — R079 Chest pain, unspecified: Secondary | ICD-10-CM | POA: Insufficient documentation

## 2015-09-21 DIAGNOSIS — Z950 Presence of cardiac pacemaker: Secondary | ICD-10-CM | POA: Insufficient documentation

## 2015-09-25 ENCOUNTER — Telehealth: Payer: Self-pay | Admitting: Cardiovascular Disease

## 2015-09-25 NOTE — Telephone Encounter (Signed)
New message     Patient returning call back to triage nurse from today

## 2015-09-27 NOTE — Telephone Encounter (Signed)
Notified of Echo results

## 2015-10-01 ENCOUNTER — Encounter (HOSPITAL_COMMUNITY)
Admission: RE | Admit: 2015-10-01 | Discharge: 2015-10-01 | Disposition: A | Discharge status: Home / Self Care | Payer: Medicaid Other | Source: Ambulatory Visit | Attending: Obstetrics and Gynecology | Admitting: Obstetrics and Gynecology

## 2015-10-01 DIAGNOSIS — O923 Agalactia: Secondary | ICD-10-CM | POA: Insufficient documentation

## 2015-10-26 IMAGING — US US OB COMP LESS 14 WK
1 series · 13 of 28 positions shown · non-contrast
Comparison: None.

CLINICAL DATA: Pregnant patient passing large clots.

EXAM:
OBSTETRIC <14 WK US AND TRANSVAGINAL OB US
TECHNIQUE: Both transabdominal and transvaginal ultrasound examinations were
performed for complete evaluation of the gestation as well as the
maternal uterus, adnexal regions, and pelvic cul-de-sac.
Transvaginal technique was performed to assess early pregnancy.

[Series 1: us ob comp less 14 wk · 0.24mm/px · 13 of 88 slices shown]
[im 4/88]
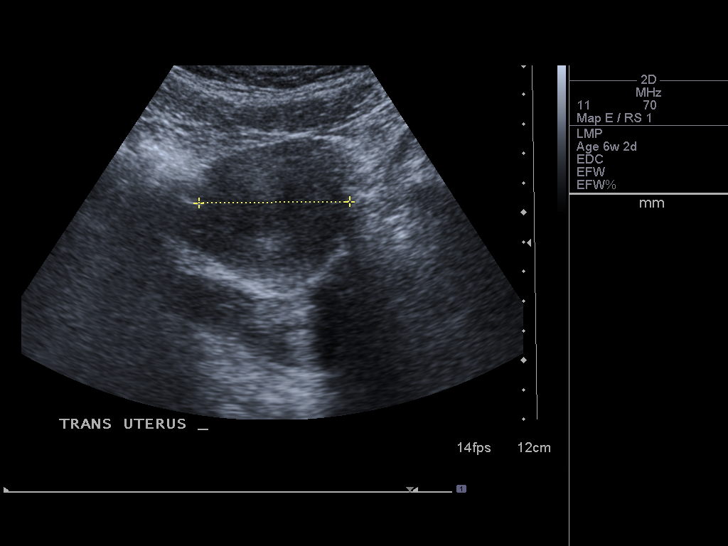
[im 10/88]
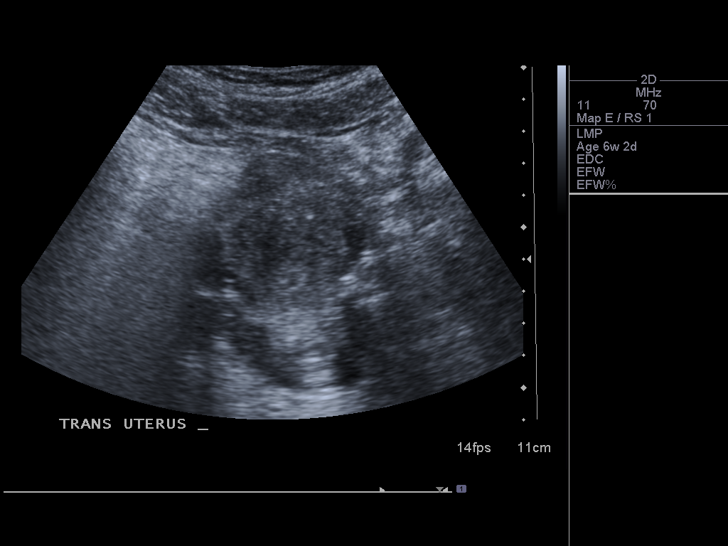
[im 17/88]
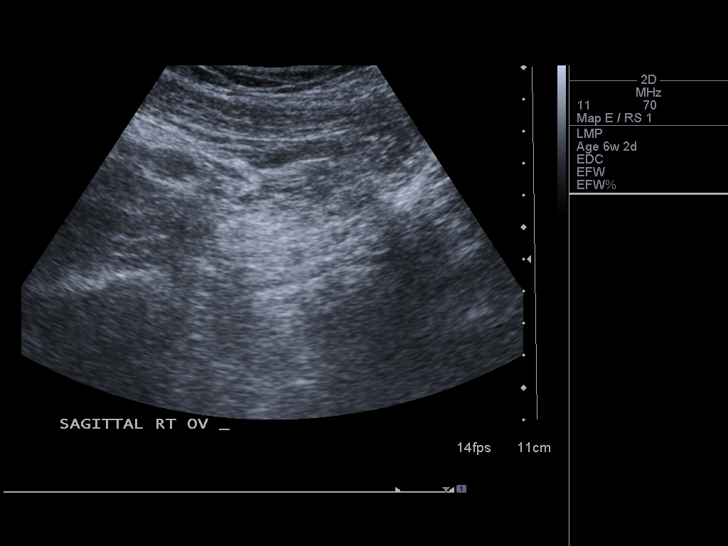
[im 23/88]
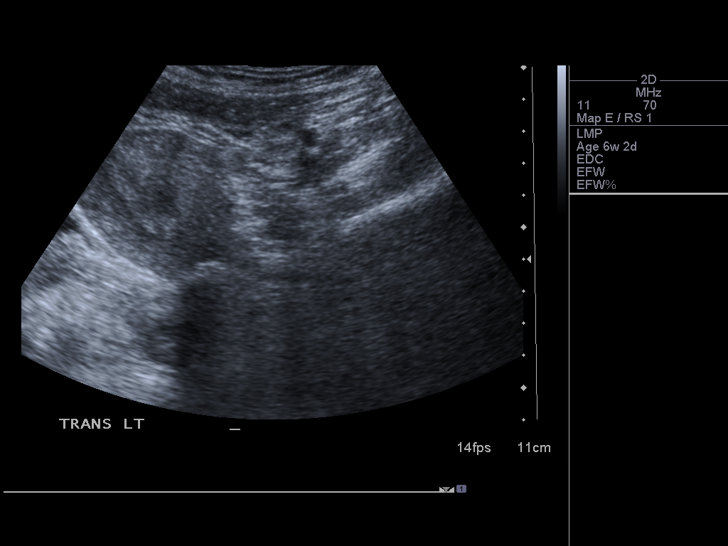
[im 30/88]
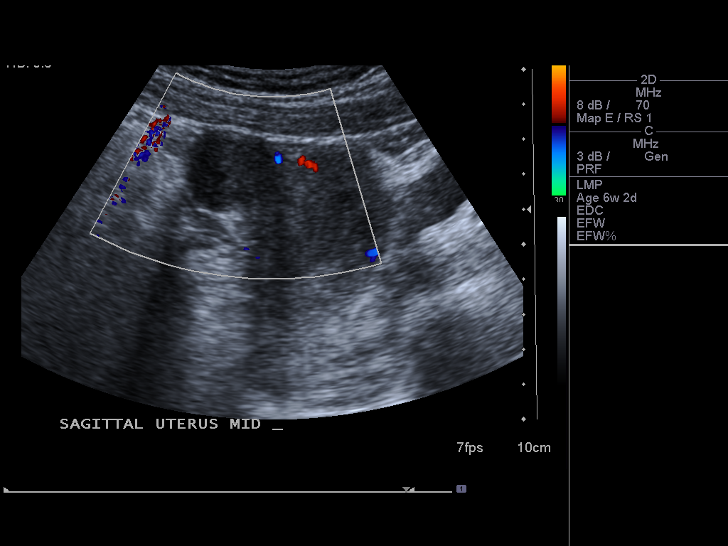
[im 36/88]
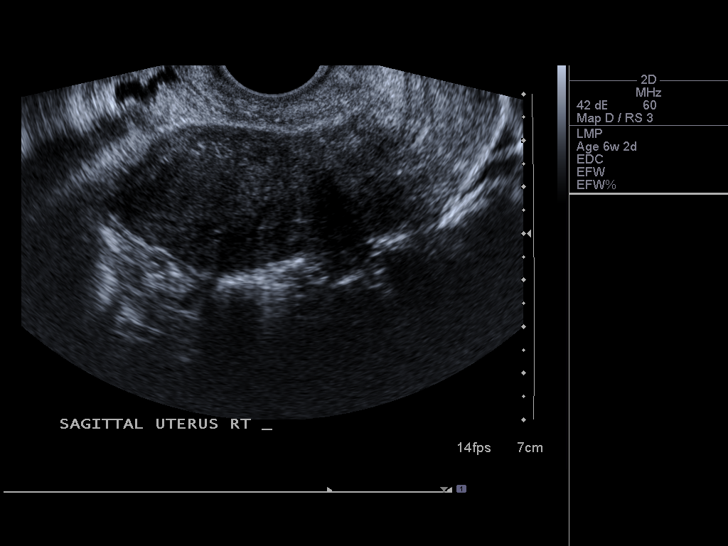
[im 46/88]
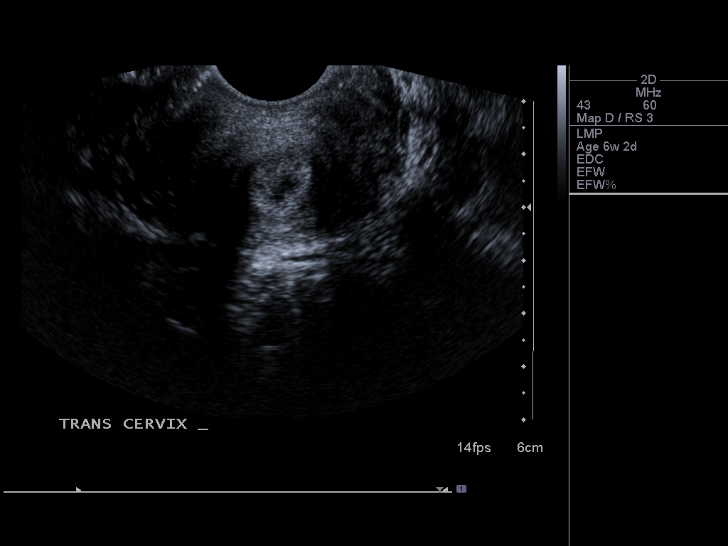
[im 52/88]
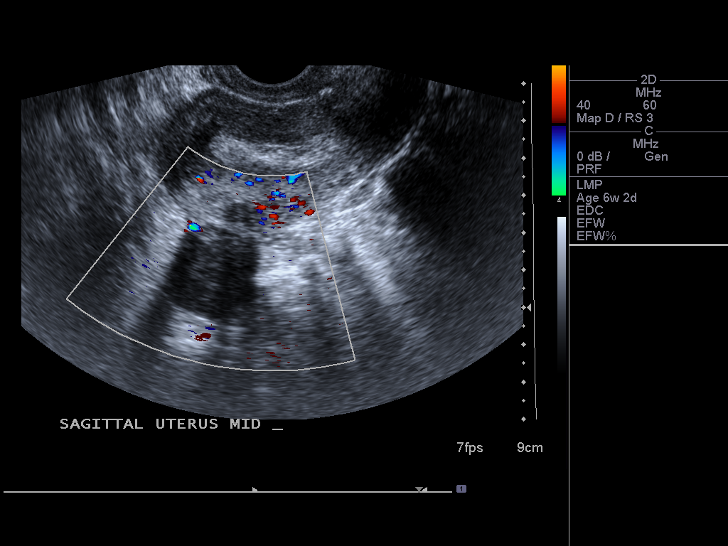
[im 59/88]
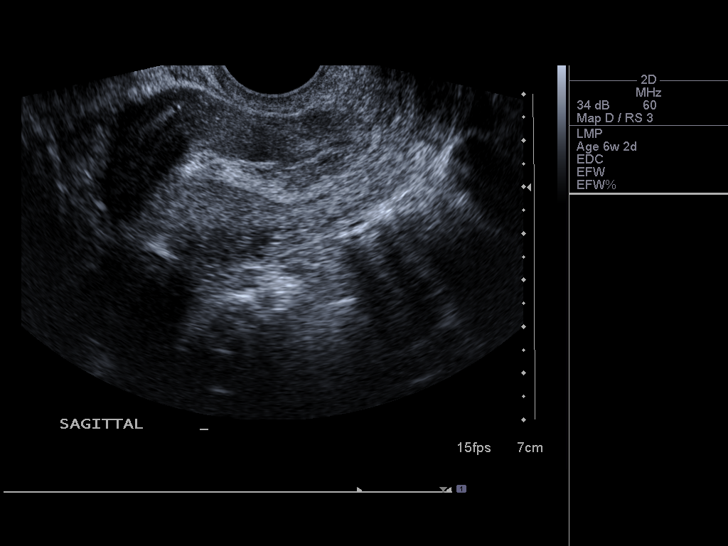
[im 65/88]
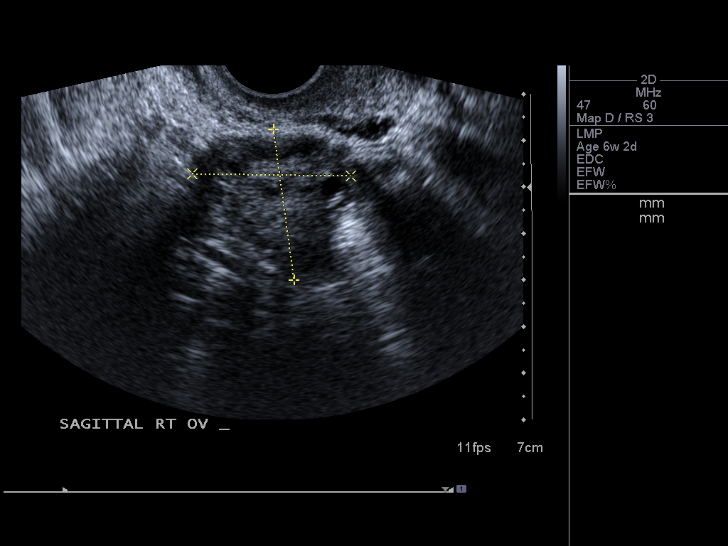
[im 71/88]
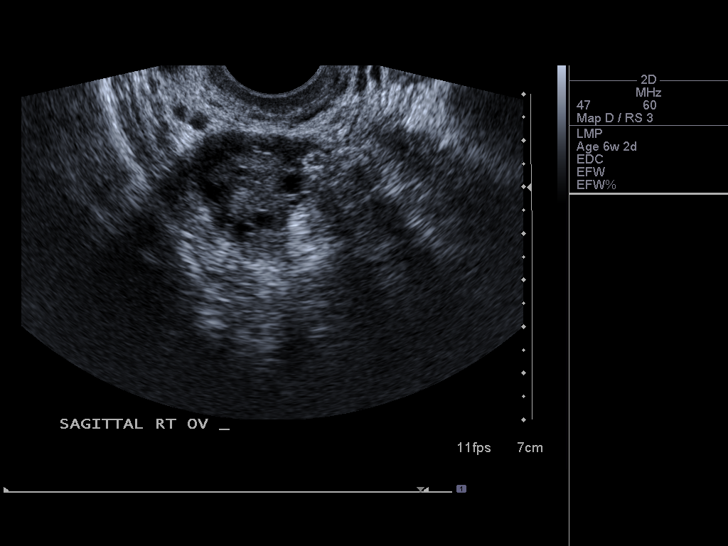
[im 78/88]
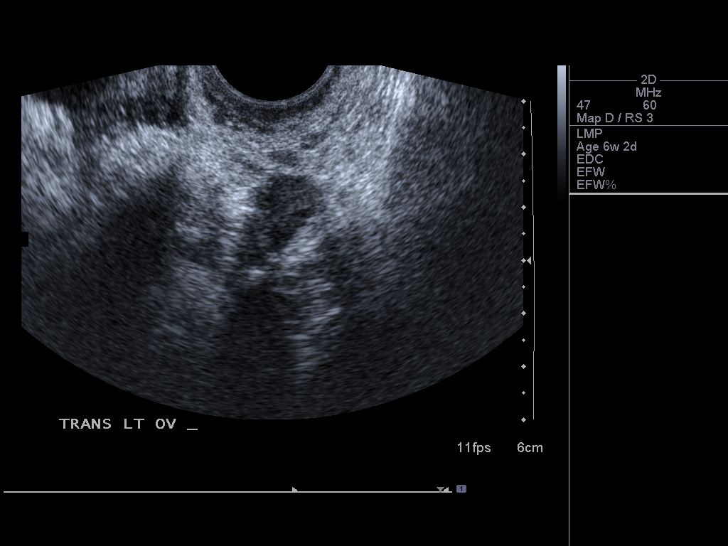
[im 84/88]
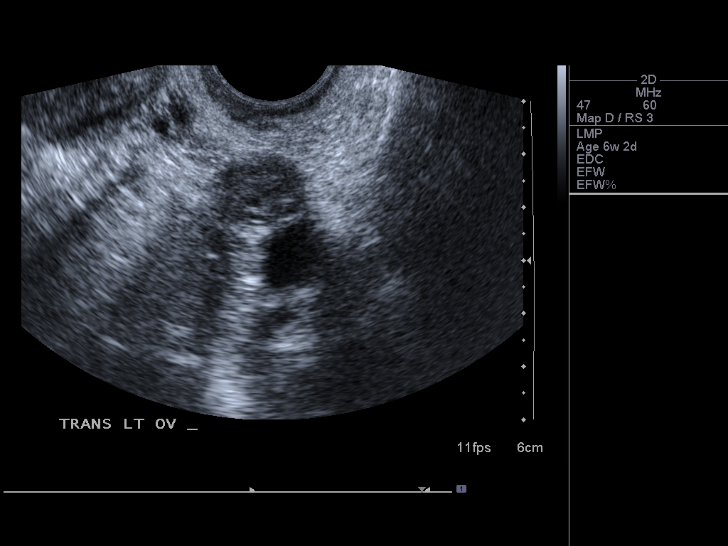

[13 of 28 positions shown; findings below may reference images not displayed]

FINDINGS: Intrauterine gestational sac: Not visualized

Yolk sac:  Not visualized

Embryo:  Not visualized

Cardiac Activity: Not present

Maternal uterus/adnexae: The endometrium is mildly heterogeneous and
measures up to 8 mm. The right ovary measures 3.4 x 3.3 x 2.6 cm.
The left ovary measures 4.3 x 1.4 x 1.4 cm. No significant free
fluid. Emanating off the posterior aspect of the uterine body is a
3.3 x 2.5 x 3.2 cm mass, favored to represent a subserosal fibroid.
IMPRESSION: No intrauterine pregnancy visualized. Inhomogeneous endometrium may
represent blood products. In the setting of positive pregnancy test
and no definite intrauterine pregnancy, this reflects a pregnancy of
unknown location. Differential considerations include early normal
IUP, abnormal IUP, or nonvisualized ectopic pregnancy.
Differentiation is achieved with serial beta HCG supplemented by
repeat sonography. Recommend follow-up ultrasound in 10-14 days.

## 2015-10-31 ENCOUNTER — Encounter (HOSPITAL_COMMUNITY)
Admission: RE | Admit: 2015-10-31 | Discharge: 2015-10-31 | Disposition: A | Discharge status: Home / Self Care | Payer: Medicaid Other | Source: Ambulatory Visit | Attending: Obstetrics and Gynecology | Admitting: Obstetrics and Gynecology

## 2015-10-31 DIAGNOSIS — O923 Agalactia: Secondary | ICD-10-CM | POA: Insufficient documentation

## 2015-11-06 ENCOUNTER — Emergency Department (HOSPITAL_BASED_OUTPATIENT_CLINIC_OR_DEPARTMENT_OTHER)
Admit: 2015-11-06 | Discharge: 2015-11-06 | Disposition: A | Discharge status: Home / Self Care | Payer: Medicaid Other | Attending: Emergency Medicine | Admitting: Emergency Medicine

## 2015-11-06 ENCOUNTER — Emergency Department (HOSPITAL_COMMUNITY)
Admission: EM | Admit: 2015-11-06 | Discharge: 2015-11-06 | Disposition: A | Discharge status: Home / Self Care | Payer: Medicaid Other | Attending: Emergency Medicine | Admitting: Emergency Medicine

## 2015-11-06 ENCOUNTER — Encounter (HOSPITAL_COMMUNITY): Payer: Self-pay | Admitting: *Deleted

## 2015-11-06 DIAGNOSIS — F419 Anxiety disorder, unspecified: Secondary | ICD-10-CM | POA: Insufficient documentation

## 2015-11-06 DIAGNOSIS — Z8701 Personal history of pneumonia (recurrent): Secondary | ICD-10-CM | POA: Diagnosis not present

## 2015-11-06 DIAGNOSIS — R0602 Shortness of breath: Secondary | ICD-10-CM | POA: Insufficient documentation

## 2015-11-06 DIAGNOSIS — Z8679 Personal history of other diseases of the circulatory system: Secondary | ICD-10-CM | POA: Diagnosis not present

## 2015-11-06 DIAGNOSIS — R1110 Vomiting, unspecified: Secondary | ICD-10-CM | POA: Insufficient documentation

## 2015-11-06 DIAGNOSIS — Z86018 Personal history of other benign neoplasm: Secondary | ICD-10-CM | POA: Insufficient documentation

## 2015-11-06 DIAGNOSIS — Z87891 Personal history of nicotine dependence: Secondary | ICD-10-CM | POA: Insufficient documentation

## 2015-11-06 DIAGNOSIS — Z950 Presence of cardiac pacemaker: Secondary | ICD-10-CM | POA: Diagnosis not present

## 2015-11-06 DIAGNOSIS — M79604 Pain in right leg: Secondary | ICD-10-CM

## 2015-11-06 DIAGNOSIS — M79609 Pain in unspecified limb: Secondary | ICD-10-CM | POA: Diagnosis not present

## 2015-11-06 DIAGNOSIS — M79661 Pain in right lower leg: Secondary | ICD-10-CM | POA: Diagnosis present

## 2015-11-06 HISTORY — DX: Other supraventricular tachycardia: I47.19

## 2015-11-06 HISTORY — DX: Supraventricular tachycardia: I47.1

## 2015-11-06 LAB — BASIC METABOLIC PANEL
Anion gap: 9 (ref 5–15)
BUN: 13 mg/dL (ref 6–20)
CHLORIDE: 105 mmol/L (ref 101–111)
CO2: 24 mmol/L (ref 22–32)
CREATININE: 0.75 mg/dL (ref 0.44–1.00)
Calcium: 9.7 mg/dL (ref 8.9–10.3)
GFR calc Af Amer: >60 mL/min (ref >60)
GFR calc non Af Amer: >60 mL/min (ref >60)
GLUCOSE: 98 mg/dL (ref 65–99)
Potassium: 4.3 mmol/L (ref 3.5–5.1)
Sodium: 138 mmol/L (ref 135–145)

## 2015-11-06 MED ORDER — IBUPROFEN 400 MG PO TABS
600.0000 mg | ORAL_TABLET | Freq: Once | ORAL | Status: AC
  Administered 2015-11-06: 600 mg via ORAL
  Filled 2015-11-06: qty 1

## 2015-11-06 MED ORDER — ONDANSETRON HCL 4 MG PO TABS: 4.0000 mg | ORAL_TABLET | Freq: Four times a day (QID) | ORAL | Status: DC

## 2015-11-06 NOTE — ED Provider Notes (Signed)
CSN: [id number]     Arrival date & time 11/06/15  1558 History   First MD Initiated Contact with Patient 11/06/15 1736     Chief Complaint  Patient presents with  . Leg Pain   HPI  Laurie Phillips is a 24 y.o. F PMH significant for AVNRT, pacemaker presenting with a 1 day history of calf pain. She describes her pain as 4/10 pain scale at its worst, intermittent, non-radiating, cramping. She endorses shortness of breath associated with anxiety (her mother who is a Therapist, sports told her it was probably a blood clot) and one episode of emesis this morning. She denies fevers, chills, CP, abdominal pain, N/V/D, BCP, smoking, extended periods of immobility, color changes in her leg.   Past Medical History  Diagnosis Date  . Headache   . Fibroid   . Sinus bradycardia   . Chest pain   . CHB (complete heart block) ([facility])     s/p Dual chamber: Atrial and HIS pacemaker placement  . Bipolar 1 disorder ([place])   . Generalized anxiety disorder   . Rh negative, antepartum   . Fibroid   . Attention deficit hyperactivity disorder   . Pneumonia   . 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 Tonsillectomy    . Knee arthroscopy Right   . Cesarean section N/A 03/08/2015    Procedure: CESAREAN SECTION;  Surgeon: Everett Graff, MD;  Location: [facility];  Service: Obstetrics;  Laterality: N/A;  . Pacemaker placement     Family History  Problem Relation Age of Onset  . Adopted: Yes  . Family history unknown: Yes   Social History  Substance Use Topics  . Smoking status: Former Smoker    Quit date: 11/11/2010  . Smokeless tobacco: Never Used  . Alcohol Use: No   OB History    Gravida Para Term Preterm AB TAB SAB Ectopic Multiple Living   2 1 0 1 1 0 1 0 0 1      Review of Systems  Ten systems are reviewed and are negative for acute change except as noted in the HPI  Allergies  Review of patient's allergies indicates no known allergies.  Home  Medications   Prior to Admission medications   Medication Sig Start Date End Date Taking? Authorizing Provider  ondansetron (ZOFRAN) 4 MG tablet Take 1 tablet (4 mg total) by mouth every 6 (six) hours. 11/06/15   Loving Lions, PA-C   BP 107/73 mmHg  Pulse 60  Temp(Src) 97.2 F (36.2 C) (Oral)  Resp 16  Ht 5\' 2"  (1.575 m)  Wt 73.936 kg  BMI 29.81 kg/m2  SpO2 100%  LMP 10/23/2015 (Approximate) Physical Exam  Constitutional: She appears well-developed and well-nourished. No distress.  HENT:  Head: Normocephalic and atraumatic.  Eyes: Conjunctivae are normal. Pupils are equal, round, and reactive to light. Right eye exhibits no discharge. Left eye exhibits no discharge. No scleral icterus.  Neck: No tracheal deviation present.  Cardiovascular: Normal rate, regular rhythm, normal heart sounds and intact distal pulses.  Exam reveals no gallop and no friction rub.   No murmur heard. Pulmonary/Chest: Effort normal and breath sounds normal. No respiratory distress. She has no wheezes. She has no rales. She exhibits no tenderness.  Abdominal: Soft. Bowel sounds are normal. She exhibits no distension and no mass. There is no tenderness. There is no rebound and no guarding.  Musculoskeletal: Normal range of motion. She exhibits no edema  or tenderness.  Lymphadenopathy:    She has no cervical adenopathy.  Neurological: She is alert. Coordination normal.  Skin: Skin is warm and dry. No rash noted. She is not diaphoretic. No erythema.  Psychiatric: She has a normal mood and affect. Her behavior is normal.  Nursing note and vitals reviewed.   ED Course  Procedures  Labs Review Labs Reviewed  BASIC METABOLIC PANEL   MDM   Final diagnoses:  Pain of right lower extremity   Patient non-toxic appearing and VSS. Preliminary results were negative for DVT or baker's cyst. Most likely MSK. Due to vomiting recently, will get BMP for electrolyte derangement. BMP negative. Patient may be  safely discharged home. Discussed reasons for return. Patient to follow-up with primary care provider. Patient in understanding and agreement with the plan.   [place] Lions, PA-C 11/06/15 2037  Harvel Quale, MD 11/07/15 [phone]

## 2015-11-06 NOTE — Progress Notes (Signed)
*  PRELIMINARY RESULTS* Vascular Ultrasound Right lower extremity venous duplex has been completed.  Preliminary findings: No evidence of DVT or baker's cyst.   Landry Mellow, RDMS, RVT  11/06/2015, 6:03 PM

## 2015-11-06 NOTE — Discharge Instructions (Signed)
Ms. Dynetta Bayliff,  Nice meeting you! Please follow-up with your primary care provider and cardiologist as needed. Return to the emergency department if you develop increasing pain, shortness of breath, chest pain, leg discolorations. Stay hydrated. Feel better soon!  S. Wendie Simmer, PA-C   Musculoskeletal Pain Musculoskeletal pain is muscle and boney aches and pains. These pains can occur in any part of the body. Your caregiver may treat you without knowing the cause of the pain. They may treat you if blood or urine tests, X-rays, and other tests were normal.  CAUSES There is often not a definite cause or reason for these pains. These pains may be caused by a type of germ (virus). The discomfort may also come from overuse. Overuse includes working out too hard when your body is not fit. Boney aches also come from weather changes. Bone is sensitive to atmospheric pressure changes. HOME CARE INSTRUCTIONS   Ask when your test results will be ready. Make sure you get your test results.  Only take over-the-counter or prescription medicines for pain, discomfort, or fever as directed by your caregiver. If you were given medications for your condition, do not drive, operate machinery or power tools, or sign legal documents for 24 hours. Do not drink alcohol. Do not take sleeping pills or other medications that may interfere with treatment.  Continue all activities unless the activities cause more pain. When the pain lessens, slowly resume normal activities. Gradually increase the intensity and duration of the activities or exercise.  During periods of severe pain, bed rest may be helpful. Lay or sit in any position that is comfortable.  Putting ice on the injured area.  Put ice in a bag.  Place a towel between your skin and the bag.  Leave the ice on for 15 to 20 minutes, 3 to 4 times a day.  Follow up with your caregiver for continued problems and no reason can be found for the pain. If the  pain becomes worse or does not go away, it may be necessary to repeat tests or do additional testing. Your caregiver may need to look further for a possible cause. SEEK IMMEDIATE MEDICAL CARE IF:  You have pain that is getting worse and is not relieved by medications.  You develop chest pain that is associated with shortness or breath, sweating, feeling sick to your stomach (nauseous), or throw up (vomit).  Your pain becomes localized to the abdomen.  You develop any new symptoms that seem different or that concern you. MAKE SURE YOU:   Understand these instructions.  Will watch your condition.  Will get help right away if you are not doing well or get worse.   This information is not intended to replace advice given to you by your health care provider. Make sure you discuss any questions you have with your health care provider.   Document Released: 10/28/2005 Document Revised: 01/20/2012 Document Reviewed: 07/02/2013 Elsevier Interactive Patient Education [employer].

## 2015-11-06 NOTE — ED Notes (Addendum)
Pt reports hx of AVNRT w/ pacemaker placement in June 2016 - pt presents today w/ RLE pain that began last night and mild swelling, pt becomes anxious and short of breath intermittently - pt concerned about potential for DVT. Pt denies shortness of breath aside from when she is anxious and denies chest pain. Pt does not use oral contraceptives, non-smoker and denies any extended periods of  immobility.

## 2015-11-27 ENCOUNTER — Encounter: Payer: Self-pay | Admitting: Internal Medicine

## 2015-11-27 ENCOUNTER — Ambulatory Visit (INDEPENDENT_AMBULATORY_CARE_PROVIDER_SITE_OTHER): Payer: Medicaid Other | Admitting: Internal Medicine

## 2015-11-27 VITALS — BP 112/60 | HR 60 | Ht 62.0 in | Wt 163.0 lb

## 2015-11-27 DIAGNOSIS — I442 Atrioventricular block, complete: Secondary | ICD-10-CM | POA: Diagnosis not present

## 2015-11-27 DIAGNOSIS — Z950 Presence of cardiac pacemaker: Secondary | ICD-10-CM

## 2015-11-27 DIAGNOSIS — R0600 Dyspnea, unspecified: Secondary | ICD-10-CM

## 2015-11-27 DIAGNOSIS — R001 Bradycardia, unspecified: Secondary | ICD-10-CM

## 2015-11-27 HISTORY — DX: Presence of cardiac pacemaker: Z95.0

## 2015-11-27 NOTE — Patient Instructions (Signed)
Medication Instructions: - no changes  Labwork: - none  Procedures/Testing: - Your physician has requested that you have an echocardiogram. Echocardiography is a painless test that uses sound waves to create images of your heart. It provides your doctor with information about the size and shape of your heart and how well your heart's chambers and valves are working. This procedure takes approximately one hour. There are no restrictions for this procedure.  - Your physician has requested that you have an exercise tolerance test- with Dr. Caryl Comes ( ok to double book). For further information please visit [email or web address]. Please also follow instruction sheet, as given.  Follow-Up: - Pending the results of your tests.  Any Additional Special Instructions Will Be Listed Below (If Applicable).

## 2015-11-27 NOTE — Progress Notes (Signed)
Electrophysiology Office Note   Date:  11/27/2015   ID:  Laurie Phillips, DOB [birth year], MRN [id number]  PCP:  Abigail Miyamoto, MD  Cardiologist:  PN Primary Electrophysiologist:    Virl Axe, MD    No chief complaint on file.    History of Present Illness: Laurie Phillips is a 25 y.o. female    who was seen at [facility] a couple weeks ago following the delivery of her first child. She was in complete heart block. She been noted to have significant first degree AV block 3 or 4 months ago. Evaluation [facility] demonstrated normal LV function. Lyme titers Ace level were normal and there is no CT evidence of hilar adenopathy.  She comes in today with her son and her husband.  She has significant problems with DOE and tachypalps and lightheadedness   Today, she denies  symptoms of  chest pain, shortness of breath, orthopnea, PND, lower extremity edema, bleeding, or neurologic sequela.  she has no  complaints of palpitations, lightheadedness presyncope or syncope  She is thought loss about this pacemaker and is now agreeable to proceeding.    Past Medical History  Diagnosis Date  . Headache   . Fibroid   . Sinus bradycardia   . Chest pain   . CHB (complete heart block) (HCC)     s/p Dual chamber: Atrial and HIS pacemaker placement  . Bipolar 1 disorder ([place])   . Generalized anxiety disorder   . Rh negative, antepartum   . Fibroid   . Attention deficit hyperactivity disorder   . Pneumonia   . 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 Tonsillectomy    . Knee arthroscopy Right   . Cesarean section N/A 03/08/2015    Procedure: CESAREAN SECTION;  Surgeon: Everett Graff, MD;  Location: [place] [facility];  Service: Obstetrics;  Laterality: N/A;  . Pacemaker placement       Current Outpatient Prescriptions  Medication Sig Dispense Refill  . ondansetron (ZOFRAN) 4 MG tablet Take 1 tablet (4 mg total) by mouth  every 6 (six) hours. 12 tablet 0   No current facility-administered medications for this visit.    Allergies:   Review of patient's allergies indicates no known allergies.   Social History:  The patient  reports that she quit smoking about 5 years ago. She has never used smokeless tobacco. She reports that she does not drink alcohol or use illicit drugs.   Family History:  The patient's She was adopted. Family history is unknown by patient.    ROS:  Please see the history of present illness.   Otherwise, review of systems is negative.    PHYSICAL EXAM: VS:  LMP 10/23/2015 (Approximate) , BMI There is no weight on file to calculate BMI. GEN: Well nourished, well developed, in no acute distress HEENT: normal Neck: JVD flat, carotid bruits, or masses Cardiac: IR RR; no murmur  rubs, noS4  Respiratory:  clear to auscultation bilaterally, normal work of breathing Back without kyphosis or CVAT\ Device pocket well healed; without hematoma or erythema.  There is no tethering  GI: soft, nontender, nondistended, + BS MS: no deformity or atrophy Skin: warm and dry,   Extremities No Clubbing cyanosis  Edema Neuro:  Strength and sensation are intact Psych: euthymic mood, full affect  EKG:  EKG is ordered today. The ekg ordered today shows sinus bradycardia at a rate of 60 wiith P-synchronous/  AV  pacing and QRS 140 msec Recent Labs: 03/08/2015: Magnesium 6.7* 03/12/2015: ALT 52 09/19/2015: Hemoglobin 14.3; Platelets 329 11/06/2015: BUN 13; Creatinine, Ser 0.75; Potassium 4.3; Sodium 138    Lipid Panel  No results found for: CHOL, TRIG, HDL, CHOLHDL, VLDL, LDLCALC, LDLDIRECT   Wt Readings from Last 3 Encounters:  11/06/15 163 lb (73.936 kg)  03/27/15 168 lb (76.204 kg)  03/20/15 169 lb 6.4 oz (76.839 kg)      Other studies Reviewed: Additional studies/ records that were reviewed today include: none  Review of the above records today demonstrates:     ASSESSMENT AND  PLAN:  Complete heart block  Pacemaker - His Bundle   DOE    Not sure of mechanisms,  Could be pacemaker related arrhtymia mediated or with issues of LV dysfunciton   Current medicines are reviewed at length with the patient today.   The patient  concerns regarding her medicines.  The following changes were made today:    Labs/ tests ordered today include:  GXT and echo     Disposition:   FU with me following pacemaker implantation.  Signed, Virl Axe, MD  11/27/2015 8:04 AM     [address]  [place] [address] [phone] (office) [phone] (fax)

## 2015-11-28 NOTE — Progress Notes (Signed)
      Patient Care Team: Briscoe Deutscher, MD as PCP - General (Family Medicine)   HPI  Laurie Phillips is a 25 y.o. female Seen in follow-up for pacemaker implanted by [name] at [facility]. She had complete heart block following her pregnancy in the spring. Without recovery of antegrade conduction she underwent His bundle pacing. She has struggled with shortness of breath.  Records and Results Reviewed records from [place]  Past Medical History  Diagnosis Date  . Headache   . Fibroid   . Sinus bradycardia   . Chest pain   . CHB (complete heart block) (HCC)     s/p Dual chamber: Atrial and HIS pacemaker placement  . Bipolar 1 disorder ([place])   . Generalized anxiety disorder   . Rh negative, antepartum   . Fibroid   . Attention deficit hyperactivity disorder   . Pneumonia   . AVNRT (AV nodal re-entry tachycardia) ([place])   . 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 Tonsillectomy    . Knee arthroscopy Right   . Cesarean section N/A 03/08/2015    Procedure: CESAREAN SECTION;  Surgeon: Everett Graff, MD;  Location: [place] [facility];  Service: Obstetrics;  Laterality: N/A;  . Pacemaker placement      Current Outpatient Prescriptions  Medication Sig Dispense Refill  . methocarbamol (ROBAXIN) 500 MG tablet Take 500 mg by mouth every 8 (eight) hours as needed.      No current facility-administered medications for this visit.    No Known Allergies    Review of Systems negative except from HPI and PMH  Physical Exam BP 112/60 mmHg  Pulse 60  Ht 5\' 2"  (1.575 m)  Wt 163 lb (73.936 kg)  BMI 29.81 kg/m2  LMP 10/23/2015 (Approximate) Well developed and well nourished in no acute distress HENT normal E scleral and icterus clear Neck Supple JVP flat; carotids brisk and full Clear to ausculation Device pocket well healed; without hematoma or erythema.  There is no tethering  Regular rate and rhythm, no murmurs gallops or rub Soft with active  bowel sounds No clubbing cyanosis  Edema Alert and oriented, grossly normal motor and sensory function Skin Warm and Dry    ECG demonstrated P synchronous pacing  Assessment and  Plan  Complete heart block  His bundle pacing -[employer]  Dyspnea on exertion  The patient has persistent dyspnea. We attempted reprogramming to see if it" and laid on her own; she could not and symptoms were considerably worse.  We will undertake an echocardiogram to see if there is evidence of pacemaker associated or otherwise notable cardiomyopathy. We'll undertake a treadmill test to see if we can elucidate the mechanism of her dyspnea

## 2015-11-30 ENCOUNTER — Ambulatory Visit (INDEPENDENT_AMBULATORY_CARE_PROVIDER_SITE_OTHER): Payer: Medicaid Other

## 2015-11-30 ENCOUNTER — Encounter: Payer: Self-pay | Admitting: Internal Medicine

## 2015-11-30 ENCOUNTER — Encounter: Payer: Medicaid Other | Admitting: Internal Medicine

## 2015-11-30 DIAGNOSIS — R0600 Dyspnea, unspecified: Secondary | ICD-10-CM | POA: Diagnosis not present

## 2015-11-30 LAB — EXERCISE TOLERANCE TEST
MPHR: 196 {beats}/min
Rest HR: 60 {beats}/min

## 2015-12-01 ENCOUNTER — Ambulatory Visit (HOSPITAL_COMMUNITY)
Admission: RE | Admit: 2015-12-01 | Discharge: 2015-12-01 | Disposition: A | Discharge status: Home / Self Care | Payer: Medicaid Other | Source: Ambulatory Visit | Attending: Obstetrics and Gynecology | Admitting: Obstetrics and Gynecology

## 2015-12-03 ENCOUNTER — Inpatient Hospital Stay (HOSPITAL_COMMUNITY)
Admission: EM | Admit: 2015-12-03 | Discharge: 2015-12-05 | DRG: 092 | Disposition: A | Discharge status: Home / Self Care | Payer: Medicaid Other | Attending: Internal Medicine | Admitting: Internal Medicine

## 2015-12-03 ENCOUNTER — Emergency Department (HOSPITAL_COMMUNITY): Payer: Medicaid Other

## 2015-12-03 ENCOUNTER — Encounter (HOSPITAL_COMMUNITY): Payer: Self-pay | Admitting: Emergency Medicine

## 2015-12-03 ENCOUNTER — Other Ambulatory Visit: Payer: Self-pay

## 2015-12-03 DIAGNOSIS — I442 Atrioventricular block, complete: Secondary | ICD-10-CM | POA: Diagnosis present

## 2015-12-03 DIAGNOSIS — G901 Familial dysautonomia [Riley-Day]: Secondary | ICD-10-CM | POA: Diagnosis present

## 2015-12-03 DIAGNOSIS — Z9889 Other specified postprocedural states: Secondary | ICD-10-CM

## 2015-12-03 DIAGNOSIS — F419 Anxiety disorder, unspecified: Secondary | ICD-10-CM

## 2015-12-03 DIAGNOSIS — Z950 Presence of cardiac pacemaker: Secondary | ICD-10-CM | POA: Diagnosis present

## 2015-12-03 DIAGNOSIS — Z87891 Personal history of nicotine dependence: Secondary | ICD-10-CM | POA: Diagnosis not present

## 2015-12-03 DIAGNOSIS — R079 Chest pain, unspecified: Secondary | ICD-10-CM

## 2015-12-03 DIAGNOSIS — R0789 Other chest pain: Secondary | ICD-10-CM | POA: Diagnosis not present

## 2015-12-03 DIAGNOSIS — R0600 Dyspnea, unspecified: Secondary | ICD-10-CM | POA: Diagnosis not present

## 2015-12-03 DIAGNOSIS — G909 Disorder of the autonomic nervous system, unspecified: Secondary | ICD-10-CM | POA: Diagnosis not present

## 2015-12-03 LAB — BASIC METABOLIC PANEL
ANION GAP: 13 (ref 5–15)
BUN: 8 mg/dL (ref 6–20)
CHLORIDE: 108 mmol/L (ref 101–111)
CO2: 18 mmol/L — ABNORMAL LOW (ref 22–32)
Calcium: 9.7 mg/dL (ref 8.9–10.3)
Creatinine, Ser: 0.71 mg/dL (ref 0.44–1.00)
GFR calc Af Amer: >60 mL/min (ref >60)
GFR calc non Af Amer: >60 mL/min (ref >60)
GLUCOSE: 109 mg/dL — AB (ref 65–99)
Potassium: 4.5 mmol/L (ref 3.5–5.1)
Sodium: 139 mmol/L (ref 135–145)

## 2015-12-03 LAB — CBC
HCT: 42.1 % (ref 36.0–46.0)
Hemoglobin: 14.6 g/dL (ref 12.0–15.0)
MCH: 30 pg (ref 26.0–34.0)
MCHC: 34.7 g/dL (ref 30.0–36.0)
MCV: 86.4 fL (ref 78.0–100.0)
Platelets: 351 10*3/uL (ref 150–400)
RBC: 4.87 MIL/uL (ref 3.87–5.11)
RDW: 13.4 % (ref 11.5–15.5)
WBC: 10.5 10*3/uL (ref 4.0–10.5)

## 2015-12-03 LAB — I-STAT TROPONIN, ED: TROPONIN I, POC: 0 ng/mL (ref 0.00–0.08)

## 2015-12-03 LAB — MRSA PCR SCREENING: MRSA BY PCR: NEGATIVE

## 2015-12-03 MED ORDER — LORAZEPAM 2 MG/ML IJ SOLN
1.0000 mg | Freq: Once | INTRAMUSCULAR | Status: AC
  Administered 2015-12-03: 1 mg via INTRAVENOUS
  Filled 2015-12-03: qty 1

## 2015-12-03 MED ORDER — MORPHINE SULFATE (PF) 4 MG/ML IV SOLN: 4.0000 mg | Freq: Once | INTRAVENOUS | Status: DC

## 2015-12-03 MED ORDER — IOHEXOL 350 MG/ML SOLN
80.0000 mL | Freq: Once | INTRAVENOUS | Status: AC | PRN
  Administered 2015-12-03: 80 mL via INTRAVENOUS

## 2015-12-03 MED ORDER — PROCHLORPERAZINE EDISYLATE 5 MG/ML IJ SOLN
10.0000 mg | Freq: Once | INTRAMUSCULAR | Status: DC
  Filled 2015-12-03: qty 2

## 2015-12-03 MED ORDER — ENOXAPARIN SODIUM 40 MG/0.4ML ~~LOC~~ SOLN
40.0000 mg | SUBCUTANEOUS | Status: DC
  Administered 2015-12-03 – 2015-12-04 (×2): 40 mg via SUBCUTANEOUS
  Filled 2015-12-03 (×2): qty 0.4

## 2015-12-03 MED ORDER — LORAZEPAM 0.5 MG PO TABS
0.5000 mg | ORAL_TABLET | Freq: Four times a day (QID) | ORAL | Status: DC | PRN
Start: 2015-12-03 — End: 2015-12-05
  Administered 2015-12-03 – 2015-12-04 (×2): 0.5 mg via ORAL
  Filled 2015-12-03 (×2): qty 1

## 2015-12-03 MED ORDER — MORPHINE SULFATE (PF) 2 MG/ML IV SOLN
2.0000 mg | INTRAVENOUS | Status: DC | PRN
  Administered 2015-12-03 – 2015-12-04 (×3): 2 mg via INTRAVENOUS
  Filled 2015-12-03 (×3): qty 1

## 2015-12-03 MED ORDER — DIPHENHYDRAMINE HCL 50 MG/ML IJ SOLN
25.0000 mg | Freq: Once | INTRAMUSCULAR | Status: DC
  Filled 2015-12-03: qty 1

## 2015-12-03 MED ORDER — MORPHINE SULFATE (PF) 4 MG/ML IV SOLN
4.0000 mg | Freq: Once | INTRAVENOUS | Status: AC
  Administered 2015-12-03: 4 mg via INTRAVENOUS
  Filled 2015-12-03: qty 1

## 2015-12-03 MED ORDER — ONDANSETRON HCL 4 MG/2ML IJ SOLN
4.0000 mg | Freq: Once | INTRAMUSCULAR | Status: AC
  Administered 2015-12-03: 4 mg via INTRAVENOUS
  Filled 2015-12-03: qty 2

## 2015-12-03 MED ORDER — GI COCKTAIL ~~LOC~~
30.0000 mL | Freq: Once | ORAL | Status: AC
  Administered 2015-12-03: 30 mL via ORAL
  Filled 2015-12-03: qty 30

## 2015-12-03 MED ORDER — SODIUM CHLORIDE 0.9 % IV BOLUS (SEPSIS): 1000.0000 mL | Freq: Once | INTRAVENOUS | Status: DC

## 2015-12-03 MED ORDER — ONDANSETRON HCL 4 MG/2ML IJ SOLN: 4.0000 mg | Freq: Once | INTRAMUSCULAR | Status: DC

## 2015-12-03 NOTE — ED Provider Notes (Signed)
CSN: [id number]     Arrival date & time 12/03/15  1113 History   First MD Initiated Contact with Patient 12/03/15 1131     Chief Complaint  Patient presents with  . Chest Pain     (Consider location/radiation/quality/duration/timing/severity/associated sxs/prior Treatment) Patient is a 25 y.o. female presenting with chest pain. The history is provided by the patient.  Chest Pain Pain location:  Substernal area Pain quality: sharp and throbbing   Pain radiates to:  Neck Pain radiates to the back: no   Pain severity:  Severe Onset quality:  Sudden Duration:  2 days Timing:  Constant Progression:  Worsening Chronicity:  New Relieved by:  Nothing Worsened by:  Certain positions Ineffective treatments:  None tried Associated symptoms: palpitations and shortness of breath   Associated symptoms: no dizziness, no fever, no headache, no nausea and not vomiting     25 yo F  With a chief complaint chest pain. This been going on for the past couple days. Patient has a history of complete heart block with implanted  Dual-chamber pacemaker defibrillator.  Patient had a settings recently adjusted about for 5 days ago. Had no issues for the first couple days and then started having throbbing pain with each heartbeat that goes up into her neck. Patient denies any injuries to her chest. Denies any sudden movements that may have dislodged her pacemaker. Had symptoms similar to this when she initially had her pacemaker implanted in the lead had migrated. Patient denies history of PE. Denies lower extremity edema. Symptoms are worse with lying flat.  Past Medical History  Diagnosis Date  . Headache   . Fibroid   . Sinus bradycardia   . Chest pain   . CHB (complete heart block) (HCC)     s/p Dual chamber: Atrial and HIS pacemaker placement  . Bipolar 1 disorder ([place])   . Generalized anxiety disorder   . Rh negative, antepartum   . Fibroid   . Attention deficit hyperactivity disorder   .  Pneumonia   . AVNRT (AV nodal re-entry tachycardia) ([place])   . 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 Tonsillectomy    . Knee arthroscopy Right   . Cesarean section N/A 03/08/2015    Procedure: CESAREAN SECTION;  Surgeon: Everett Graff, MD;  Location: [facility];  Service: Obstetrics;  Laterality: N/A;  . Pacemaker placement     Family History  Problem Relation Age of Onset  . Adopted: Yes  . Family history unknown: Yes   Social History  Substance Use Topics  . Smoking status: Former Smoker    Quit date: 11/11/2010  . Smokeless tobacco: Never Used  . Alcohol Use: No   OB History    Gravida Para Term Preterm AB TAB SAB Ectopic Multiple Living   2 1 0 1 1 0 1 0 0 1      Review of Systems  Constitutional: Negative for fever and chills.  HENT: Negative for congestion and rhinorrhea.   Eyes: Negative for redness and visual disturbance.  Respiratory: Positive for shortness of breath. Negative for wheezing.   Cardiovascular: Positive for chest pain and palpitations.  Gastrointestinal: Negative for nausea and vomiting.  Genitourinary: Negative for dysuria and urgency.  Musculoskeletal: Negative for myalgias and arthralgias.  Skin: Negative for pallor and wound.  Neurological: Negative for dizziness and headaches.      Allergies  Review of patient's allergies indicates no known allergies.  Home Medications   Prior  to Admission medications   Not on File   BP 99/59 mmHg  Pulse 59  Temp(Src) 98.4 F (36.9 C) (Oral)  Resp 14  Ht 5\' 2"  (1.575 m)  Wt 165 lb 4.8 oz (74.98 kg)  BMI 30.23 kg/m2  SpO2 92%  LMP 11/27/2015  Breastfeeding? No Physical Exam  Constitutional: She is oriented to person, place, and time. She appears well-developed and well-nourished. No distress.  HENT:  Head: Normocephalic and atraumatic.  Eyes: EOM are normal. Pupils are equal, round, and reactive to light.  Neck: Normal range of motion. Neck supple.   Cardiovascular: Normal rate, regular rhythm and intact distal pulses.  Exam reveals no gallop and no friction rub.   No murmur heard. Pulmonary/Chest: Effort normal. She has no wheezes. She has no rales.   No noted tenderness to the ICD pocket. No erythema.  Abdominal: Soft. She exhibits no distension. There is no tenderness. There is no rebound and no guarding.  Musculoskeletal: She exhibits no edema or tenderness.  Neurological: She is alert and oriented to person, place, and time.  Skin: Skin is warm and dry. She is not diaphoretic.  Psychiatric: She has a normal mood and affect. Her behavior is normal.  Nursing note and vitals reviewed.   ED Course  Procedures (including critical care time) Labs Review Labs Reviewed  BASIC METABOLIC PANEL - Abnormal; Notable for the following:    CO2 18 (*)    Glucose, Bld 109 (*)    All other components within normal limits  BASIC METABOLIC PANEL - Abnormal; Notable for the following:    CO2 21 (*)    Glucose, Bld 105 (*)    Calcium 8.8 (*)    All other components within normal limits  MRSA PCR SCREENING  CBC  MAGNESIUM  I-STAT TROPOININ, ED    Imaging Review Ct Angio Chest Pe W/cm &/or Wo Cm  12/03/2015  CLINICAL DATA:  Severe mid chest pain, sensation of pulsation left neck, syncope, history of heart block EXAM: CT ANGIOGRAPHY CHEST WITH CONTRAST TECHNIQUE: Multidetector CT imaging of the chest was performed using the standard protocol during bolus administration of intravenous contrast. Multiplanar CT image reconstructions and MIPs were obtained to evaluate the vascular anatomy. CONTRAST:  37mL OMNIPAQUE IOHEXOL 350 MG/ML SOLN COMPARISON:  12/03/2015 chest radiograph FINDINGS: Mediastinum/Nodes: Cardiac pacer noted with generator over left thorax. No pericardial effusion. Thoracic inlet normal. Thyroid normal. Small volume soft tissue anterior mediastinum underlying the sternum likely residual thymus. No significant hilar or mediastinal  adenopathy. The thoracic aorta shows no evidence of dissection or dilatation. There are no filling defects identified in the pulmonary arterial system. Lungs/Pleura: No pleural effusion. No infiltrate or consolidation. 1 cm bleb superior segment right lower lobe not of acute consequence. Minimal left base atelectasis. Upper abdomen: No acute findings Musculoskeletal: Negative Review of the MIP images confirms the above findings. IMPRESSION: No acute abnormalities identified. Electronically Signed   By: Skipper Cliche M.D.   On: 12/03/2015 18:00   Dg Chest Portable 1 View  12/03/2015  CLINICAL DATA:  Pressure and the center of the chest and shortness of breath. EXAM: PORTABLE CHEST 1 VIEW COMPARISON:  09/19/2015 FINDINGS: Dual lead cardiac pacemaker is stable. Cardiomediastinal silhouette is normal. Mediastinal contours appear intact. There is no evidence of focal airspace consolidation, pleural effusion or pneumothorax. Osseous structures are without acute abnormality. Soft tissues are grossly normal. IMPRESSION: No active disease. Electronically Signed   By: Fidela Salisbury M.D.   On: 12/03/2015 13:11  I have personally reviewed and evaluated these images and lab results as part of my medical decision-making.   EKG Interpretation   Date/Time:  Sunday December 03 2015 11:18:29 EST Ventricular Rate:  63 PR Interval:    QRS Duration: 162 QT Interval:  466 QTC Calculation: 476 R Axis:   78 Text Interpretation:  AV sequential or dual chamber electronic pacemaker  No significant change since last tracing Confirmed by Donn Zanetti MD, Quillian Quince  [id number]) on 12/03/2015 11:38:22 AM       Procedure note: Ultrasound Guided Peripheral IV Ultrasound guided peripheral 1.88 inch angiocath IV placement performed by me. Indications: Nursing unable to place IV. Details: The antecubital fossa and upper arm were evaluated with a multifrequency linear probe. Patent brachial veins were noted. 1 attempt was made to  cannulate a vein under realtime US guidance with successful cannulation of the vein and catheter placement. There is return of non-pulsatile dark red blood. The patient tolerated the procedure well without complications. Images archived electronically.  CPT codes: [phone] and [phone]  MDM   Final diagnoses:  Chest pain, unspecified chest pain type  Anxiety    25 yo F  With a chief complaint of chest pain. Pacemaker was interrogated at bedside and had no events. Is working properly. Discussed the case with cardiology as this may be secondary to the recent adjustments. She was evaluated at bedside and felt that that could be the cause however they were unsure of  The actual etiology of the pain and recommended a further workup while waiting for the pacemaker technician to arrive.  Will obtain a CT angiogram of the chest rule out PE.   Symptoms were unrelieved with turning off of the pacemaker.  Awaiting PE study. Delay due to difficulty with iv access, placed by me under US guidance.   Turned over to Dr. Oleta Mouse.  Dr. Baird Kay cards is unsure of etiology, recommends obs admit for symptom control  with eval by her EP doc, Dr Caryl Comes in the morning.   The patients results and plan were reviewed and discussed.   Any x-rays performed were independently reviewed by myself.   Differential diagnosis were considered with the presenting HPI.  Medications  morphine 4 MG/ML injection 4 mg (not administered)  ondansetron (ZOFRAN) injection 4 mg (not administered)  prochlorperazine (COMPAZINE) injection 10 mg (not administered)  diphenhydrAMINE (BENADRYL) injection 25 mg (not administered)  sodium chloride 0.9 % bolus 1,000 mL (not administered)  enoxaparin (LOVENOX) injection 40 mg (40 mg Subcutaneous Given 12/03/15 2055)  LORazepam (ATIVAN) tablet 0.5 mg (0.5 mg Oral Given 12/03/15 2009)  morphine 2 MG/ML injection 2 mg (2 mg Intravenous Given 12/03/15 2009)  morphine 4 MG/ML injection 4 mg (4 mg Intravenous  Given 12/03/15 1211)  ondansetron (ZOFRAN) injection 4 mg (4 mg Intravenous Given 12/03/15 1211)  morphine 4 MG/ML injection 4 mg (4 mg Intravenous Given 12/03/15 1327)  LORazepam (ATIVAN) injection 1 mg (1 mg Intravenous Given 12/03/15 1604)  gi cocktail (Maalox,Lidocaine,Donnatal) (30 mLs Oral Given 12/03/15 1638)  iohexol (OMNIPAQUE) 350 MG/ML injection 80 mL (80 mLs Intravenous Contrast Given 12/03/15 1721)    Filed Vitals:   12/03/15 2000 12/03/15 2005 12/04/15 0000 12/04/15 0400  BP:  107/67 98/71 99/59   Pulse:  63 60 59  Temp:  98.8 F (37.1 C) 98.4 F (36.9 C) 98.4 F (36.9 C)  TempSrc:  Oral Oral Oral  Resp:   15 14  Height: 5\' 2"  (1.575 m)     Weight: 165 lb  4.8 oz (74.98 kg)     SpO2:  100% 91% 92%    Final diagnoses:  Chest pain, unspecified chest pain type  Anxiety    Admission/ observation were discussed with the admitting physician, patient and/or family and they are comfortable with the plan.    Deno Etienne, DO 12/04/15 [phone]

## 2015-12-03 NOTE — Consult Note (Signed)
Primary Care Physician: Abigail Miyamoto, MD Referring Physician:  Admit Date: 12/03/2015  Reason for consultation:  Laurie Phillips is a 25 y.o. female with a h/o complete heart block following pregnancy in the spring status post dual-chamber pacemaker with His bundle pacing. She presents today with chest and neck pain. She says that on Friday she underwent treadmill test with adjustment of her pacemaker. After her pacemaker was adjusted she felt well for 24 hours, but today she has had significant neck pain as well as some chest pain. The pain is worse with deep inspiration, and worse with lying down. It is improved with sitting up and sometimes with being up and walking but this can also make the pain worse. She says that this pain is similar to pain that she had when the device was implanted.  On interrogation of the device, the parameters are stable without change from the prior parameters.  Her discomfort is increased when her AV delay is lengthened, and is worsened with her native conduction.    Past Medical History  Diagnosis Date  . Headache   . Fibroid   . Sinus bradycardia   . Chest pain   . CHB (complete heart block) (HCC)     s/p Dual chamber: Atrial and HIS pacemaker placement  . Bipolar 1 disorder ([place])   . Generalized anxiety disorder   . Rh negative, antepartum   . Fibroid   . Attention deficit hyperactivity disorder   . Pneumonia   . AVNRT (AV nodal re-entry tachycardia) ([place])   . 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 Tonsillectomy    . Knee arthroscopy Right   . Cesarean section N/A 03/08/2015    Procedure: CESAREAN SECTION;  Surgeon: Everett Graff, MD;  Location: [facility];  Service: Obstetrics;  Laterality: N/A;  . Pacemaker placement      .  morphine injection  4 mg Intravenous Once      No Known Allergies  Social History   Social History  . Marital Status: Married    Spouse Name: N/A  . Number of Children:  N/A  . Years of Education: N/A   Occupational History  . Not on file.   Social History Main Topics  . Smoking status: Former Smoker    Quit date: 11/11/2010  . Smokeless tobacco: Never Used  . Alcohol Use: No  . Drug Use: No  . Sexual Activity: Yes    Birth Control/ Protection: None   Other Topics Concern  . Not on file   Social History Narrative    Family History  Problem Relation Age of Onset  . Adopted: Yes  . Family history unknown: Yes    ROS- All systems are reviewed and negative except as per the HPI above  Physical Exam: Telemetry: Filed Vitals:   12/03/15 1230 12/03/15 1245 12/03/15 1300 12/03/15 1315  BP: 109/70 106/71 111/68 103/63  Pulse: 61 59 61 69  Temp:      Resp: 18 17 12 11   Height:      Weight:      SpO2: 99% 99% 100% 99%    GEN- The patient is well appearing, alert and oriented x 3 today.   Head- normocephalic, atraumatic Eyes-  Sclera clear, conjunctiva pink Ears- hearing intact Oropharynx- clear Neck- supple, no JVP Lymph- no cervical lymphadenopathy Lungs- Clear to ausculation bilaterally, normal work of breathing Heart- Regular rate and rhythm, no murmurs, rubs or gallops, PMI not laterally displaced  GI- soft, NT, ND, + BS Extremities- no clubbing, cyanosis, or edema MS- no significant deformity or atrophy Skin- no rash or lesion Psych- euthymic mood, full affect Neuro- strength and sensation are intact  EKG- A sense, intermittent A pace, V paced  Labs:   Lab Results  Component Value Date   WBC 10.5 12/03/2015   HGB 14.6 12/03/2015   HCT 42.1 12/03/2015   MCV 86.4 12/03/2015   PLT 351 12/03/2015    Recent Labs Lab 12/03/15 1136  NA 139  K 4.5  CL 108  CO2 18*  BUN 8  CREATININE 0.71  CALCIUM 9.7  GLUCOSE 109*   No results found for: CKTOTAL, CKMB, CKMBINDEX, TROPONINI No results found for: CHOL No results found for: HDL No results found for: LDLCALC No results found for: TRIG No results found for:  CHOLHDL No results found for: LDLDIRECT    Radiology:CXR No active disease.  Echo:09/20/16 - Left ventricle: The cavity size was normal. Wall thickness was normal. Systolic function was normal. The estimated ejection fraction was in the range of 60% to 65%. Wall motion was normal; there were no regional wall motion abnormalities. Left ventricular diastolic function parameters were normal. - Right ventricle: Pacer wire or catheter noted in right ventricle. - Right atrium: Pacer wire or catheter noted in right atrium.  ASSESSMENT AND PLAN:  1. Chest pain: Interrogation of her device does not show any reason for her discomfort.  On changing her pacing rates, she has more pain with bradycardia and more symptoms with a longer AV delay.  It is unclear if the pacemaker is the cause of her discomfort at this time as no changes have been made to the lead and the parametes are unchanged.  Her ECG when not pacing shows no acute abnormalities.  Agree with CTPA for further workup.  There is a significant anxiety component and agree with anxiolytics.     Jessaca Philippi Meredith Leeds, MD 12/03/2015  1:48 PM

## 2015-12-03 NOTE — ED Notes (Signed)
Pt c/o center chest pain with pulsating feeling in neck the patient of Dr Caryl Comes and has a pacemaker. Symptoms onset last night while laying down. Pt reports feeling lightheaded. Pt seen by Dr Caryl Comes Friday who made adjustments to her pacemaker.

## 2015-12-03 NOTE — H&P (Signed)
History and Physical  Magdalynn Largen [id number] DOB: [birth year] DOA: 12/03/2015  PCP: Abigail Miyamoto, MD   Chief Complaint: chest/neck pain   History of Present Illness:  Patient is a 25 yo female with history of anxiety and complete heart block after pregnancy s/p double chamber PM placement who came to ED with cc of chest/neck pain. She said she had had feeling of dyspnea until her pacemaker was adjusted on Thursday last week. Afterwards she felt great until yesterday afternoon when she started having moderate chest and neck pain in substernal and bilateral neck area that lasts minutes to hours, gets worse with sitting up/leaning forward/lying down, no relieved by rest or changing position, non exertional with dyspnea ( feeling she can't take a deep breath). She does feel anxious and says she can't tell the difference between having dyspnea from that or from her cardiac arrythemia. She has mild dry cough but denies fever/chills/N/V/D/C/GU symptoms. Otherwise she has no complaints.   Review of Systems:  CONSTITUTIONAL:  No night sweats.  No fatigue, malaise, lethargy.  No fever or chills. Eyes:  No visual changes.  No eye pain.  No eye discharge.   ENT:    No epistaxis.  No sinus pain.  No sore throat.  No ear pain.  No congestion. RESPIRATORY:  No cough.  No wheeze.  No hemoptysis.  +shortness of breath. CARDIOVASCULAR:  +chest pains.  +palpitations. GASTROINTESTINAL:  No abdominal pain.  No nausea or vomiting.  No diarrhea or constipation.  No hematemesis.  No hematochezia.  No melena. GENITOURINARY:  No urgency.  No frequency.  No dysuria.  No hematuria.  No obstructive symptoms.  No discharge.  No pain.  No significant abnormal bleeding. MUSCULOSKELETAL:  No musculoskeletal pain.  No joint swelling.  No arthritis. NEUROLOGICAL:  No confusion.  No weakness. No headache. No seizure. PSYCHIATRIC:  No depression. +anxiety. No suicidal ideation. SKIN:  No rashes.  No lesions.  No  wounds. ENDOCRINE:  No unexplained weight loss.  No polydipsia.  No polyuria.  No polyphagia. HEMATOLOGIC:  No anemia.  No purpura.  No petechiae.  No bleeding.  ALLERGIC AND IMMUNOLOGIC:  No pruritus.  No swelling Other:  Past Medical and Surgical History:   Past Medical History  Diagnosis Date  . Headache   . Fibroid   . Sinus bradycardia   . Chest pain   . CHB (complete heart block) (HCC)     s/p Dual chamber: Atrial and HIS pacemaker placement  . Bipolar 1 disorder ([place])   . Generalized anxiety disorder   . Rh negative, antepartum   . Fibroid   . Attention deficit hyperactivity disorder   . Pneumonia   . AVNRT (AV nodal re-entry tachycardia) ([place])   . 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 Tonsillectomy    . Knee arthroscopy Right   . Cesarean section N/A 03/08/2015    Procedure: CESAREAN SECTION;  Surgeon: Everett Graff, MD;  Location: [facility];  Service: Obstetrics;  Laterality: N/A;  . Pacemaker placement      Social History:   reports that she quit smoking about 5 years ago. She has never used smokeless tobacco. She reports that she does not drink alcohol or use illicit drugs.   No Known Allergies  Family History  Problem Relation Age of Onset  . Adopted: Yes  . Family history unknown: Yes      Prior to Admission medications   Not on File  Physical Exam: BP 112/64 mmHg  Pulse 69  Temp(Src) 97.9 F (36.6 C)  Resp 21  Ht 5\' 2"  (1.575 m)  Wt 74.39 kg (164 lb)  BMI 29.99 kg/m2  SpO2 99%  LMP 11/27/2015  Breastfeeding? No  GENERAL : Well developed, well nourished, alert and cooperative, and appears to be in no acute distress. HEAD: normocephalic. EYES: vision is grossly intact. EARS:  hearing grossly intact. NOSE: No nasal discharge. THROAT: Oral cavity and pharynx normal.  NECK: Neck supple CARDIAC: Normal S1 and S2. No S3, S4 or murmurs. Rhythm is regular. There is no peripheral edema, cyanosis  or pallor. LUNGS: Clear to auscultation  ABDOMEN: Positive bowel sounds. Soft, nondistended, nontender. No guarding or rebound. No masses. NEUROLOGICAL: The mental examination revealed the patient was oriented to person, place, and time.CN II-XII intact.[name] SKIN: Skin normal color, texture and turgor with no lesions or eruptions. PSYCHIATRIC:  The patient was able to demonstrate good judgement and reason, without hallucinations,          Labs on Admission:  Reviewed.   Radiological Exams on Admission: Ct Angio Chest Pe W/cm &/or Wo Cm  12/03/2015  CLINICAL DATA:  Severe mid chest pain, sensation of pulsation left neck, syncope, history of heart block EXAM: CT ANGIOGRAPHY CHEST WITH CONTRAST TECHNIQUE: Multidetector CT imaging of the chest was performed using the standard protocol during bolus administration of intravenous contrast. Multiplanar CT image reconstructions and MIPs were obtained to evaluate the vascular anatomy. CONTRAST:  30mL OMNIPAQUE IOHEXOL 350 MG/ML SOLN COMPARISON:  12/03/2015 chest radiograph FINDINGS: Mediastinum/Nodes: Cardiac pacer noted with generator over left thorax. No pericardial effusion. Thoracic inlet normal. Thyroid normal. Small volume soft tissue anterior mediastinum underlying the sternum likely residual thymus. No significant hilar or mediastinal adenopathy. The thoracic aorta shows no evidence of dissection or dilatation. There are no filling defects identified in the pulmonary arterial system. Lungs/Pleura: No pleural effusion. No infiltrate or consolidation. 1 cm bleb superior segment right lower lobe not of acute consequence. Minimal left base atelectasis. Upper abdomen: No acute findings Musculoskeletal: Negative Review of the MIP images confirms the above findings. IMPRESSION: No acute abnormalities identified. Electronically Signed   By: Skipper Cliche M.D.   On: 12/03/2015 18:00   Dg Chest Portable 1 View  12/03/2015  CLINICAL DATA:  Pressure and the center  of the chest and shortness of breath. EXAM: PORTABLE CHEST 1 VIEW COMPARISON:  09/19/2015 FINDINGS: Dual lead cardiac pacemaker is stable. Cardiomediastinal silhouette is normal. Mediastinal contours appear intact. There is no evidence of focal airspace consolidation, pleural effusion or pneumothorax. Osseous structures are without acute abnormality. Soft tissues are grossly normal. IMPRESSION: No active disease. Electronically Signed   By: Fidela Salisbury M.D.   On: 12/03/2015 13:11    EKG:  Independently reviewed. Non paced shows Mobitz II heart block.   Assessment/Plan  Chest /neck pain: Atypical  Seen by cardio.  : Interrogation of her device does not show any reason for her discomfort. On changing her pacing rates, she has more pain with bradycardia and more symptoms with a longer AV delay. CTPE neg No h/o invasive procedure recently. Dissection unlikely but in DDx. Anxiety may play a role: will start morphine prn pain and ativan 0.5mg  prn anxiety.  Cardio following.  No risk factors for CAD. No FH of CAD/significant smoking hisory/HTN/HLD. EKG is non ischemic and trop #1 is neg.  Echo in am   DVT prophylaxis: Levy enoxaparin Consultants: Cardio Code Status: Full  Gennaro Africa M.D [facility]

## 2015-12-03 NOTE — ED Provider Notes (Signed)
Please see previous physicians note regarding patient's presenting history and physical, initial ED course, and associated medical decision making. In short this a 25 year old female presenting with chest pain. She has a history of complete heart block with placement of a dual chamber pacemaker with defibrillator. Presents with several day history of chest pain that radiates up to her neck and jaw. Has a paced rhythm on EKG, with negative serial troponins. Dr. Ninfa Meeker gets from cardiology had also been consultative and seen the patient. Her pacemaker interrogation was overall unremarkable. Dr. Melodye Ped also  discussed with EP physician, and at this time unclear of etiology of her chest pain. Pacemaker also temporarily turned off, with no relief of pain. Pending CT PE at time of sign out with plans to admit for observation and ongoing chest pain. CT PE is negative for any acute intrathoracic processes. She does have continuous ongoing chest pain of unclear etiology. Plan to admit to hospitalists, for pain control and cardiac monitoring. Dr. Caryl Comes, patient's cardiologist, will see patient in the morning.  Forde Dandy, MD 12/03/15 [phone]

## 2015-12-03 NOTE — ED Notes (Signed)
Unable to obtain IV, Dr Marsh Dolly will attempt with ultrasound

## 2015-12-04 ENCOUNTER — Inpatient Hospital Stay (HOSPITAL_COMMUNITY): Payer: Medicaid Other

## 2015-12-04 ENCOUNTER — Other Ambulatory Visit (HOSPITAL_COMMUNITY): Payer: Medicaid Other

## 2015-12-04 ENCOUNTER — Ambulatory Visit (HOSPITAL_COMMUNITY): Payer: Medicaid Other

## 2015-12-04 DIAGNOSIS — R079 Chest pain, unspecified: Secondary | ICD-10-CM

## 2015-12-04 DIAGNOSIS — R0789 Other chest pain: Secondary | ICD-10-CM

## 2015-12-04 DIAGNOSIS — R0600 Dyspnea, unspecified: Secondary | ICD-10-CM

## 2015-12-04 DIAGNOSIS — I442 Atrioventricular block, complete: Secondary | ICD-10-CM

## 2015-12-04 LAB — EXERCISE TOLERANCE TEST
CSEPED: 2 min
CSEPEDS: 29 s
CSEPEW: 3.2 METS
CSEPPHR: 126 {beats}/min
MPHR: 196 {beats}/min
Percent HR: 64 %
Rest HR: 61 {beats}/min

## 2015-12-04 LAB — BASIC METABOLIC PANEL
Anion gap: 11 (ref 5–15)
BUN: 10 mg/dL (ref 6–20)
CO2: 21 mmol/L — ABNORMAL LOW (ref 22–32)
Calcium: 8.8 mg/dL — ABNORMAL LOW (ref 8.9–10.3)
Chloride: 106 mmol/L (ref 101–111)
Creatinine, Ser: 0.8 mg/dL (ref 0.44–1.00)
GFR calc Af Amer: >60 mL/min (ref >60)
Glucose, Bld: 105 mg/dL — ABNORMAL HIGH (ref 65–99)
POTASSIUM: 4.9 mmol/L (ref 3.5–5.1)
SODIUM: 138 mmol/L (ref 135–145)

## 2015-12-04 LAB — MAGNESIUM: MAGNESIUM: 2.1 mg/dL (ref 1.7–2.4)

## 2015-12-04 MED ORDER — INFLUENZA VAC SPLIT QUAD 0.5 ML IM SUSY: 0.5000 mL | PREFILLED_SYRINGE | INTRAMUSCULAR | Status: DC

## 2015-12-04 NOTE — Progress Notes (Signed)
  25 y/o ? 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  Went o Development worker, international aid.  Felt full of energy Then had episodic neck and cp and radiation adn dyspnea  Work-up so far has included a CT which was neg for PE, Stress test was done which showed AVNRT/SVT during the stress test and Vagal procedure seemed to resolve this  Given the high probability that this is SVT and needs further electrical tuning up by my collegues with EP, I have discussed carefully  With patient and do not think there eis anything further IM can add to her care.  I will sign off but am available if needed and Cardiology can assume care.  I discussed with Ms Luetta Nutting of EP  Verneita Griffes, MD [facility] [phone]

## 2015-12-04 NOTE — Progress Notes (Signed)
Patient ID: Laurie Phillips, female   DOB: May 23, 1991, 25 y.o.   MRN: [id number]    Patient Name: Laurie Phillips Date of Encounter: 12/04/2015     Active Problems:   Chest pain    SUBJECTIVE  Multiple pains. No sob.   CURRENT MEDS . diphenhydrAMINE  25 mg Intravenous Once  . enoxaparin (LOVENOX) injection  40 mg Subcutaneous Q24H  . [START ON 12/05/2015] Influenza vac split quadrivalent PF  0.5 mL Intramuscular Tomorrow-1000  .  morphine injection  4 mg Intravenous Once  . ondansetron (ZOFRAN) IV  4 mg Intravenous Once  . prochlorperazine  10 mg Intravenous Once  . sodium chloride  1,000 mL Intravenous Once    OBJECTIVE  Filed Vitals:   12/03/15 2005 12/04/15 0000 12/04/15 0400 12/04/15 0800  BP: 107/67 98/71 99/59  106/68  Pulse: 63 60 59 68  Temp: 98.8 F (37.1 C) 98.4 F (36.9 C) 98.4 F (36.9 C)   TempSrc: Oral Oral Oral   Resp:  15 14 14   Height:      Weight:      SpO2: 100% 91% 92% 100%    Intake/Output Summary (Last 24 hours) at 12/04/15 0839 Last data filed at 12/03/15 1842  Gross per 24 hour  Intake      0 ml  Output    125 ml  Net   -125 ml   Filed Weights   12/03/15 1124 12/03/15 2000  Weight: 164 lb (74.39 kg) 165 lb 4.8 oz (74.98 kg)    PHYSICAL EXAM  General: Pleasant, NAD. Neuro: Alert and oriented X 3. Moves all extremities spontaneously. Psych: Normal affect. HEENT:  Normal  Neck: Supple without bruits or JVD. Lungs:  Resp regular and unlabored, CTA. Heart: RRR no s3, s4, or murmurs. Abdomen: Soft, non-tender, non-distended, BS + x 4.  Extremities: No clubbing, cyanosis or edema. DP/PT/Radials 2+ and equal bilaterally.  Accessory Clinical Findings  CBC  Recent Labs  12/03/15 1136  WBC 10.5  HGB 14.6  HCT 42.1  MCV 86.4  PLT [id number]   Basic Metabolic Panel  Recent Labs  12/03/15 1136 12/04/15 0221  NA 139 138  K 4.5 4.9  CL 108 106  CO2 18* 21*  GLUCOSE 109* 105*  BUN 8 10  CREATININE 0.71 0.80  CALCIUM 9.7 8.8*  MG   --  2.1   Liver Function Tests No results for input(s): AST, ALT, ALKPHOS, BILITOT, PROT, ALBUMIN in the last 72 hours. No results for input(s): LIPASE, AMYLASE in the last 72 hours. Cardiac Enzymes No results for input(s): CKTOTAL, CKMB, CKMBINDEX, TROPONINI in the last 72 hours. BNP Invalid input(s): POCBNP D-Dimer No results for input(s): DDIMER in the last 72 hours. Hemoglobin A1C No results for input(s): HGBA1C in the last 72 hours. Fasting Lipid Panel No results for input(s): CHOL, HDL, LDLCALC, TRIG, CHOLHDL, LDLDIRECT in the last 72 hours. Thyroid Function Tests No results for input(s): TSH, T4TOTAL, T3FREE, THYROIDAB in the last 72 hours.  Invalid input(s): FREET3  TELE  nsr  ECG  NSR with p synchronous ventricular pacing  Radiology/Studies  Ct Angio Chest Pe W/cm &/or Wo Cm  12/03/2015  CLINICAL DATA:  Severe mid chest pain, sensation of pulsation left neck, syncope, history of heart block EXAM: CT ANGIOGRAPHY CHEST WITH CONTRAST TECHNIQUE: Multidetector CT imaging of the chest was performed using the standard protocol during bolus administration of intravenous contrast. Multiplanar CT image reconstructions and MIPs were obtained to evaluate the vascular anatomy. CONTRAST:  85mL OMNIPAQUE  IOHEXOL 350 MG/ML SOLN COMPARISON:  12/03/2015 chest radiograph FINDINGS: Mediastinum/Nodes: Cardiac pacer noted with generator over left thorax. No pericardial effusion. Thoracic inlet normal. Thyroid normal. Small volume soft tissue anterior mediastinum underlying the sternum likely residual thymus. No significant hilar or mediastinal adenopathy. The thoracic aorta shows no evidence of dissection or dilatation. There are no filling defects identified in the pulmonary arterial system. Lungs/Pleura: No pleural effusion. No infiltrate or consolidation. 1 cm bleb superior segment right lower lobe not of acute consequence. Minimal left base atelectasis. Upper abdomen: No acute findings  Musculoskeletal: Negative Review of the MIP images confirms the above findings. IMPRESSION: No acute abnormalities identified. Electronically Signed   By: Skipper Cliche M.D.   On: 12/03/2015 18:00   Dg Chest Portable 1 View  12/03/2015  CLINICAL DATA:  Pressure and the center of the chest and shortness of breath. EXAM: PORTABLE CHEST 1 VIEW COMPARISON:  09/19/2015 FINDINGS: Dual lead cardiac pacemaker is stable. Cardiomediastinal silhouette is normal. Mediastinal contours appear intact. There is no evidence of focal airspace consolidation, pleural effusion or pneumothorax. Osseous structures are without acute abnormality. Soft tissues are grossly normal. IMPRESSION: No active disease. Electronically Signed   By: Fidela Salisbury M.D.   On: 12/03/2015 13:11    ASSESSMENT AND PLAN  1. Chest/neck and abdominal pain after PM adjustment 2. Sob prior to adjustment 3. Underlying CHB, s/p his bundle pacing 4. Peripartum CM causing #3 Rec: i am perplexed by her constellation of symptoms. I have recommended we repeat her stress test with her device reprogrammed to her prior parameters. I am uncertain as to what is causing her symptoms. She does not appear to have diaphragmatic stimulation.  Gregg Taylor,M.D.  12/04/2015 8:39 AM

## 2015-12-04 NOTE — Progress Notes (Signed)
Husband came with their infant to visit. Brought shoes in for pt to be able to walk on treadmill. A little anxious. Pt moaning so husband put her socks and shoes on for her. Up to [facility] with hands off assist. Back to bed than ready for treadmill stress test that DR Lovena Le said pt would have today - Transport here.

## 2015-12-04 NOTE — Discharge Summary (Signed)
ELECTROPHYSIOLOGY PROCEDURE DISCHARGE SUMMARY    Patient ID: Laurie Phillips,  MRN: [id number], DOB/AGE: 05-07-1991 25 y.o.  Admit date: 12/03/2015 Discharge date: 12/05/2015  Primary Care Physician: Abigail Miyamoto, MD Primary Electrophysiologist: Ivar Drape at [facility]  Primary Discharge Diagnosis:  Principal Problem:   Chest pain Active Problems:   Complete heart block (HCC)   Pacemaker-His Bundle   Dysautonomia   Pain in the chest   No Known Allergies  Procedures This Admission: 1.  Echocardiogram 12/04/15 demonstrated EF 55-60%, no RWMA, PA pressure 28  2.  GXT 12/04/15 demonstrated exercise tolerance of 51minutes 29 seconds, no SVT noted, some ST with standing, no upper rate pacemaker behavior 3.  Chest CT 12/03/15 negative for PE or other acute abnormalities.   Brief HPI/Hospital Course:  Laurie Phillips is a 25 y.o. female with a past medical history significant for complete heart block post pregnancy. She underwent His bundle pacing by Dr Lujean Rave at [facility] post delivery. Since then, she has had persistent shortness of breath and chest pain despite multiple device reprogrammings. She presented to the ER on the day of admission with recurrent chest and throat pain that was not associated with exertion and persistent.  Increasing and decreasing AV delays did not help, lowering the base rate did not help. She underwent CT scan of the chest with no PE noted. Echocardiogram demonstrated normal LV function.  GXT showed poor exercise tolerance but no SVT or upper rate pacemaker behavior noted. She did have ST with standing.  She was seen by Dr Caryl Comes who felt that her symptoms were most likely related to dysautonomia.  Increased hydration and salt were recommended as well as an abdominal binder. She was considered stable for discharge to home with outpatient follow up.   Pacemaker was reprogrammed to decrease TARP and allow for higher upper rate tracking.   Physical Exam: Filed Vitals:   12/04/15 2035 12/05/15 0503 12/05/15 0817 12/05/15 0819  BP: 109/70 106/67 113/58 109/70  Pulse: 60 60 59 70  Temp: 98.4 F (36.9 C) 98.6 F (37 C)    TempSrc: Oral Oral    Resp: 20 18    Height:      Weight:      SpO2: 97% 100% 99% 100%    Labs:   Lab Results  Component Value Date   WBC 10.5 12/03/2015   HGB 14.6 12/03/2015   HCT 42.1 12/03/2015   MCV 86.4 12/03/2015   PLT 351 12/03/2015     Recent Labs Lab 12/04/15 0221  NA 138  K 4.9  CL 106  CO2 21*  BUN 10  CREATININE 0.80  CALCIUM 8.8*  GLUCOSE 105*     Discharge Medications: There are no discharge medications for this patient.   Disposition: Pt is being discharged home today in good condition. Discharge Instructions    Diet - low sodium heart healthy    Complete by:  As directed      Discharge instructions    Complete by:  As directed   Increase salt and water intake Use abdominal binder Websites about dysautonomia - [employer]se, www.potsplace.com     Increase activity slowly    Complete by:  As directed           Follow-up Information    Follow up with Virl Axe, MD On 12/12/2015.   Specialty:  Cardiology   Why:  at 2:30PM   Contact information:   [address] [place] [address]  [phone]       Duration of Discharge Encounter: Greater than 30 minutes including physician time.  Signed, Chanetta Marshall, NP 12/05/2015 8:21 AM

## 2015-12-04 NOTE — Progress Notes (Signed)
Pt transferred per MD order to 2W03 for further observation. All belongings transferred with patient, VSS, patient in no distress.

## 2015-12-04 NOTE — Plan of Care (Signed)
Problem: Phase I Progression Outcomes Goal: Hemodynamically stable Outcome: Progressing Patient VSS throughout the evening shift and morning.  Goal: Voiding-avoid urinary catheter unless indicated Outcome: Completed/Met Date Met:  12/04/15 Patient able to use [facility] with assistance from staff to navigate safely around patient care equipment.  Problem: Phase II Progression Outcomes Goal: Stress Test if indicated Outcome: Completed/Met Date Met:  12/04/15 Patient completing stress test at this time.

## 2015-12-04 NOTE — Progress Notes (Signed)
Patient received from 2S into 2w03 alert and oriented. Here for observation. On arrival vitals checked were stable. Safety measures put in placed and cardiac monitor placed on her. Will keep monitoring.

## 2015-12-04 NOTE — Progress Notes (Signed)
  Echocardiogram 2D Echocardiogram has been performed.  [name] 12/04/2015, 12:48 PM

## 2015-12-05 DIAGNOSIS — R079 Chest pain, unspecified: Secondary | ICD-10-CM | POA: Insufficient documentation

## 2015-12-05 DIAGNOSIS — G901 Familial dysautonomia [Riley-Day]: Secondary | ICD-10-CM

## 2015-12-05 DIAGNOSIS — Z950 Presence of cardiac pacemaker: Secondary | ICD-10-CM

## 2015-12-05 DIAGNOSIS — G909 Disorder of the autonomic nervous system, unspecified: Secondary | ICD-10-CM

## 2015-12-05 NOTE — Progress Notes (Signed)
   12/05/15 0817 12/05/15 0819 12/05/15 0820  Vitals  BP (!) 113/58 mmHg 109/70 mmHg 106/74 mmHg  BP Location Left Arm Left Arm Left Arm  BP Method Automatic Automatic Automatic  Patient Position (if appropriate) Lying Sitting Standing  Pulse Rate (!) 59 70 92  Oxygen Therapy  SpO2 99 % 100 % 100 %  O2 Device Room Air Room Air Room Air   Sitting up, pt experienced some lightheadedness and felt her heart was racing. Upon standing, pt felt very lightheaded, pulsing in her throat, and felt like she could not catch her breath. After returning to lying position, pt felt better within 5 minutes.

## 2015-12-05 NOTE — Addendum Note (Signed)
Addended by: Freada Bergeron on: 12/05/2015 05:10 PM   Modules accepted: Orders

## 2015-12-05 NOTE — Progress Notes (Signed)
\       Patient Name: Laurie Phillips      SUBJECTIVE:   Data from tele reviewed,  No evidence of reentrant SVT; clearly episodes of rapid increase in HR and a P wave lost in PVARP which I dont understand unless the device was reprogrammed as suggested by note from GT yeesterday Test also demonstrates inappropriate BP response to exercise, as seen last week,  If device reprogrammed, it might explain why it was   Past Medical History  Diagnosis Date  . Headache   . Fibroid   . Sinus bradycardia   . Chest pain   . CHB (complete heart block) (HCC)     s/p Dual chamber: Atrial and HIS pacemaker placement  . Bipolar 1 disorder ([place])   . Generalized anxiety disorder   . Rh negative, antepartum   . Fibroid   . Attention deficit hyperactivity disorder   . Pneumonia   . AVNRT (AV nodal re-entry tachycardia) ([place])   . Pacemaker-His Bundle 11/27/2015    Scheduled Meds:  Scheduled Meds: . diphenhydrAMINE  25 mg Intravenous Once  . enoxaparin (LOVENOX) injection  40 mg Subcutaneous Q24H  . Influenza vac split quadrivalent PF  0.5 mL Intramuscular Tomorrow-1000  . ondansetron (ZOFRAN) IV  4 mg Intravenous Once  . prochlorperazine  10 mg Intravenous Once  . sodium chloride  1,000 mL Intravenous Once   Continuous Infusions:  LORazepam, morphine injection    PHYSICAL EXAM Filed Vitals:   12/04/15 1555 12/04/15 1827 12/04/15 2035 12/05/15 0503  BP: 109/65 99/72 109/70 106/67  Pulse: 60 72 60 60  Temp: 98.7 F (37.1 C) 97.8 F (36.6 C) 98.4 F (36.9 C) 98.6 F (37 C)  TempSrc: Oral Oral Oral Oral  Resp: 14 16 20 18   Height:      Weight:      SpO2: 100% 100% 97% 100%    Well developed and nourished in no acute distress HENT normal Neck supple with JVP-flat Clear Regular rate and rhythm, no murmurs or gallops Abd-soft with active BS No Clubbing cyanosis edema Skin-warm and dry A & Oriented  Grossly normal sensory and motor function   TELEMETRY: Reviewed  telemetry pt in sinus with rapid HR with some loss of P-synchronous/ AV  pacing     Intake/Output Summary (Last 24 hours) at 12/05/15 0801 Last data filed at 12/04/15 2037  Gross per 24 hour  Intake    610 ml  Output    800 ml  Net   -190 ml    LABS: Basic Metabolic Panel:  Recent Labs Lab 12/03/15 1136 12/04/15 0221  NA 139 138  K 4.5 4.9  CL 108 106  CO2 18* 21*  GLUCOSE 109* 105*  BUN 8 10  CREATININE 0.71 0.80  CALCIUM 9.7 8.8*  MG  --  2.1   Cardiac Enzymes: No results for input(s): CKTOTAL, CKMB, CKMBINDEX, TROPONINI in the last 72 hours. 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  We discussed  the role of salt and water repletion, the importance of exercise, often needing to be started in the recumbent position, and the awareness of triggers and the role of ambient heat and dehydration  Discharge Abdominal binder Thermatabs 2 po bid Water/gatorade repletion POTSPlace//NDRF web site information Return pacer to duynamic AV delay, turn off PMT algorithm make sure PVARP remains short     Signed, Virl Axe MD  12/05/2015

## 2015-12-12 ENCOUNTER — Encounter: Payer: Self-pay | Admitting: Internal Medicine

## 2015-12-12 ENCOUNTER — Ambulatory Visit (INDEPENDENT_AMBULATORY_CARE_PROVIDER_SITE_OTHER): Payer: Medicaid Other | Admitting: Internal Medicine

## 2015-12-12 VITALS — BP 100/69 | HR 62 | Ht 62.0 in | Wt 160.6 lb

## 2015-12-12 DIAGNOSIS — I442 Atrioventricular block, complete: Secondary | ICD-10-CM | POA: Diagnosis not present

## 2015-12-12 DIAGNOSIS — G909 Disorder of the autonomic nervous system, unspecified: Secondary | ICD-10-CM | POA: Diagnosis not present

## 2015-12-12 DIAGNOSIS — F329 Major depressive disorder, single episode, unspecified: Secondary | ICD-10-CM

## 2015-12-12 DIAGNOSIS — F418 Other specified anxiety disorders: Secondary | ICD-10-CM

## 2015-12-12 DIAGNOSIS — G901 Familial dysautonomia [Riley-Day]: Secondary | ICD-10-CM

## 2015-12-12 DIAGNOSIS — F419 Anxiety disorder, unspecified: Secondary | ICD-10-CM

## 2015-12-12 NOTE — Progress Notes (Signed)
R dysautonomia     Patient Care Team: Briscoe Deutscher, MD as PCP - General (Family Medicine)   HPI  Laurie Phillips is a 25 y.o. female followup for complete heart block  and symptoms suggestive of dysautonomia   f  she's continues to struggle with lightheadedness and dizziness albeit less. There've been less chest pain. She continues to have some palpitations. She is more aware of them at night when she's lying down. She is also having problems with exercise intolerance. She is looked at the information on the dysautonomic websites and finds it haltingly familiar she has referred  to other issues that have had significant emotional psychosocial stress.       Past Medical History  Diagnosis Date  . Headache   . Fibroid   . Sinus bradycardia   . Chest pain   . CHB (complete heart block) (HCC)     s/p Dual chamber: Atrial and HIS pacemaker placement  . Bipolar 1 disorder ([place])   . Generalized anxiety disorder   . Rh negative, antepartum   . Fibroid   . Attention deficit hyperactivity disorder   . Pneumonia   . AVNRT (AV nodal re-entry tachycardia) ([place])   . 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 Tonsillectomy    . Knee arthroscopy Right   . Cesarean section N/A 03/08/2015    Procedure: CESAREAN SECTION;  Surgeon: Everett Graff, MD;  Location: [facility];  Service: Obstetrics;  Laterality: N/A;  . Pacemaker placement      Current Outpatient Prescriptions  Medication Sig Dispense Refill  . NON FORMULARY Sodium Chloride 1 tablet BID     No current facility-administered medications for this visit.    No Known Allergies    Review of Systems negative except from HPI and PMH  Physical Exam BP 100/69 mmHg  Pulse 62  Ht 5\' 2"  (1.575 m)  Wt 160 lb 9.6 oz (72.848 kg)  BMI 29.37 kg/m2  LMP 11/27/2015 Well developed and well nourished in no acute distress HENT normal E scleral and icterus clear Neck Supple JVP flat; carotids  brisk and full Clear to ausculation  *Regular rate and rhythm, no murmurs gallops or rub Soft with active bowel sounds No clubbing cyanosis  Edema Alert and oriented, grossly normal motor and sensory function Skin Warm and Dry    Assessment and  Plan  Complete heart block  dysatuonomia  Psycho social stressors  We have reveiwed the importance of exercise and addressing the bitterness rooted in her life  i have given her the name of [facility]   More than 50% of 45 min was spent in counseling related to the above

## 2015-12-12 NOTE — Patient Instructions (Signed)
Medication Instructions: - no changes  Labwork: - none  Procedures/Testing: - none  Follow-Up: - Your physician recommends that you schedule a follow-up appointment in: 2 months with Dr. Caryl Comes.  Any Additional Special Instructions Will Be Listed Below (If Applicable).     If you need a refill on your cardiac medications before your next appointment, please call your pharmacy.

## 2015-12-28 ENCOUNTER — Encounter: Payer: Medicaid Other | Admitting: Internal Medicine

## 2016-01-03 ENCOUNTER — Encounter (HOSPITAL_COMMUNITY)
Admission: RE | Admit: 2016-01-03 | Discharge: 2016-01-03 | Disposition: A | Discharge status: Home / Self Care | Payer: Medicaid Other | Source: Ambulatory Visit | Attending: Obstetrics and Gynecology | Admitting: Obstetrics and Gynecology

## 2016-01-03 DIAGNOSIS — O923 Agalactia: Secondary | ICD-10-CM | POA: Insufficient documentation

## 2016-02-02 ENCOUNTER — Encounter (HOSPITAL_COMMUNITY)
Admission: RE | Admit: 2016-02-02 | Discharge: 2016-02-02 | Disposition: A | Discharge status: Home / Self Care | Payer: Medicaid Other | Source: Ambulatory Visit | Attending: Obstetrics and Gynecology | Admitting: Obstetrics and Gynecology

## 2016-02-02 DIAGNOSIS — O923 Agalactia: Secondary | ICD-10-CM | POA: Insufficient documentation

## 2016-02-06 ENCOUNTER — Encounter: Payer: Self-pay | Admitting: Internal Medicine

## 2016-02-06 ENCOUNTER — Ambulatory Visit (INDEPENDENT_AMBULATORY_CARE_PROVIDER_SITE_OTHER): Payer: Medicaid Other | Admitting: Internal Medicine

## 2016-02-06 VITALS — BP 112/67 | HR 69 | Ht 62.0 in | Wt 153.4 lb

## 2016-02-06 DIAGNOSIS — I442 Atrioventricular block, complete: Secondary | ICD-10-CM

## 2016-02-06 DIAGNOSIS — Z950 Presence of cardiac pacemaker: Secondary | ICD-10-CM | POA: Diagnosis not present

## 2016-02-06 DIAGNOSIS — G909 Disorder of the autonomic nervous system, unspecified: Secondary | ICD-10-CM

## 2016-02-06 DIAGNOSIS — G901 Familial dysautonomia [Riley-Day]: Secondary | ICD-10-CM

## 2016-02-06 NOTE — Progress Notes (Signed)
R dysautonomia     Patient Care Team: Briscoe Deutscher, MD as PCP - General (Family Medicine)   HPI  Laurie Phillips is a 25 y.o. female followup for complete heart block  and symptoms suggestive of dysautonomia   She is s/p His Bundle pacing and has been exercising to a couple of hours a day  Mood is also mch better       Past Medical History  Diagnosis Date  . Headache   . Fibroid   . Sinus bradycardia   . Chest pain   . CHB (complete heart block) (HCC)     s/p Dual chamber: Atrial and HIS pacemaker placement  . Bipolar 1 disorder ([place])   . Generalized anxiety disorder   . Rh negative, antepartum   . Fibroid   . Attention deficit hyperactivity disorder   . Pneumonia   . AVNRT (AV nodal re-entry tachycardia) ([place])   . 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 Tonsillectomy    . Knee arthroscopy Right   . Cesarean section N/A 03/08/2015    Procedure: CESAREAN SECTION;  Surgeon: Everett Graff, MD;  Location: [facility];  Service: Obstetrics;  Laterality: N/A;  . Pacemaker placement      Current Outpatient Prescriptions  Medication Sig Dispense Refill  . Multiple Vitamin (MULTIVITAMIN) tablet Take 1 tablet by mouth daily.    . NON FORMULARY Sodium Chloride 1 tablet BID     No current facility-administered medications for this visit.    No Known Allergies    Review of Systems negative except from HPI and PMH  Physical Exam BP 112/67 mmHg  Pulse 69  Ht 5\' 2"  (1.575 m)  Wt 153 lb 6.4 oz (69.582 kg)  BMI 28.05 kg/m2 Well developed and well nourished in no acute distress HENT normal E scleral and icterus clear Neck Supple JVP flat; carotids brisk and full Clear to ausculation  *Regular rate and rhythm, no murmurs gallops or rub Soft with active bowel sounds No clubbing cyanosis  Edema Alert and oriented, grossly normal motor and sensory function Skin Warm and Dry    Assessment and  Plan  Complete heart  block  Pacemaker Medtronic   dysatuonomia  Psycho social stressors   She is doing much better with exercise and mood

## 2016-02-06 NOTE — Patient Instructions (Signed)
Medication Instructions: - Your physician recommends that you continue on your current medications as directed. Please refer to the Current Medication list given to you today.  Labwork: - none  Procedures/Testing: - none  Follow-Up: - Remote monitoring is used to monitor your Pacemaker of ICD from home. This monitoring reduces the number of office visits required to check your device to one time per year. It allows [place] to keep an eye on the functioning of your device to ensure it is working properly. You are scheduled for a device check from home on 05/07/16. You may send your transmission at any time that day. If you have a wireless device, the transmission will be sent automatically. After your physician reviews your transmission, you will receive a postcard with your next transmission date.  - Your physician wants you to follow-up in: October 2017 with Dr. Caryl Comes. You will receive a reminder letter in the mail two months in advance. If you don't receive a letter, please call our office to schedule the follow-up appointment.  Any Additional Special Instructions Will Be Listed Below (If Applicable).     If you need a refill on your cardiac medications before your next appointment, please call your pharmacy.

## 2016-02-15 ENCOUNTER — Telehealth: Payer: Self-pay | Admitting: Internal Medicine

## 2016-02-15 NOTE — Telephone Encounter (Signed)
Reviewed with Dr. Curt Bears. He advised that the patient is not restricted completely from riding roller coasters, but she will need to strongly consider the impact this could have on symptoms she may feel from her dysautonomia. I have notified the patient of this and asked her to please think very closely about how she may be impacted symptomatically from riding a roller coaster.  I have also advised her if it is hot when she is out of town, to please stay well hydrated with water/ gatorade to hopefully help prevent some of her symptoms from her dysautonomia. She voices understanding.

## 2016-02-15 NOTE — Telephone Encounter (Signed)
New message     [place] . Is she able to ride roller coaster .

## 2016-02-15 NOTE — Telephone Encounter (Signed)
Will forward to Dr. Curt Bears for recommendations.

## 2016-03-04 ENCOUNTER — Encounter (HOSPITAL_COMMUNITY)
Admission: RE | Admit: 2016-03-04 | Discharge: 2016-03-04 | Disposition: A | Discharge status: Home / Self Care | Payer: Medicaid Other | Source: Ambulatory Visit | Attending: Obstetrics and Gynecology | Admitting: Obstetrics and Gynecology

## 2016-03-04 DIAGNOSIS — O923 Agalactia: Secondary | ICD-10-CM | POA: Insufficient documentation

## 2016-04-03 ENCOUNTER — Encounter (HOSPITAL_COMMUNITY)
Admission: RE | Admit: 2016-04-03 | Discharge: 2016-04-03 | Disposition: A | Discharge status: Home / Self Care | Payer: Medicaid Other | Source: Ambulatory Visit | Attending: Obstetrics and Gynecology | Admitting: Obstetrics and Gynecology

## 2016-04-03 DIAGNOSIS — O923 Agalactia: Secondary | ICD-10-CM | POA: Insufficient documentation

## 2016-04-10 ENCOUNTER — Telehealth: Payer: Self-pay | Admitting: Cardiology

## 2016-04-10 NOTE — Telephone Encounter (Signed)
Returned Ms. Norenberg' call. She says that Tuesday she noticed these episodes of seeing spots and SOB. She reports that they may be POTS. I reviewed transmission- some episodes of "AT/AF" that shows V rates of 150-160s. These episodes do not correlate with times of her symptoms. She expresses relief. I told her that I would review with Dr. Caryl Comes (due to her history of CHB) and call her back with any recommendations. She is appreciative.

## 2016-04-10 NOTE — Telephone Encounter (Signed)
Pt called in and stated that she has been a little more short of breath, seeing spots, and a little chest pain. Pt is not sure if it is her POTS or not. Pt is going to send a remote transmission. Please call back to discuss results of the transmission.

## 2016-04-11 ENCOUNTER — Telehealth: Payer: Self-pay | Admitting: *Deleted

## 2016-04-11 NOTE — Telephone Encounter (Signed)
Called Mr. Neyra to have [facility] call back to device clinic. Made aware that this is not an emergency.

## 2016-04-11 NOTE — Telephone Encounter (Signed)
Spoke with Chrys Racer. I let her know that I reviewed her symptoms with Dr. Caryl Comes and he would like to see her in the office. Appt available Monday 04/15/16 at 1:45pm. She is agreeable and appreciative.

## 2016-04-15 ENCOUNTER — Encounter: Payer: Self-pay | Admitting: Internal Medicine

## 2016-04-15 ENCOUNTER — Ambulatory Visit (INDEPENDENT_AMBULATORY_CARE_PROVIDER_SITE_OTHER): Payer: Medicaid Other | Admitting: Internal Medicine

## 2016-04-15 VITALS — BP 116/81 | HR 64 | Ht 62.0 in | Wt 147.0 lb

## 2016-04-15 DIAGNOSIS — G909 Disorder of the autonomic nervous system, unspecified: Secondary | ICD-10-CM | POA: Diagnosis not present

## 2016-04-15 DIAGNOSIS — G901 Familial dysautonomia [Riley-Day]: Secondary | ICD-10-CM

## 2016-04-15 DIAGNOSIS — I442 Atrioventricular block, complete: Secondary | ICD-10-CM | POA: Diagnosis not present

## 2016-04-15 LAB — CUP PACEART INCLINIC DEVICE CHECK
Battery Voltage: 3.01 V
Brady Statistic AS VP Percent: 53.77 %
Brady Statistic RA Percent Paced: 46.12 %
Brady Statistic RV Percent Paced: 99.87 %
Eval Rhythm: 1
Implantable Lead Implant Date: 20160606
Implantable Lead Location: 753859
Implantable Lead Location: 753860
Implantable Lead Model: 3830
Lead Channel Impedance Value: 437 Ohm
Lead Channel Impedance Value: 627 Ohm
Lead Channel Pacing Threshold Amplitude: 0.5 V
Lead Channel Pacing Threshold Pulse Width: 0.4 ms
Lead Channel Pacing Threshold Pulse Width: 0.6 ms
Lead Channel Sensing Intrinsic Amplitude: 2.75 mV
Lead Channel Sensing Intrinsic Amplitude: 3.625 mV
Lead Channel Setting Pacing Amplitude: 2 V
Lead Channel Setting Pacing Pulse Width: 0.6 ms
MDC IDC LEAD IMPLANT DT: 20160606
MDC IDC MSMT BATTERY REMAINING LONGEVITY: 93 mo
MDC IDC MSMT LEADCHNL RA IMPEDANCE VALUE: 304 Ohm
MDC IDC MSMT LEADCHNL RA SENSING INTR AMPL: 3.625 mV
MDC IDC MSMT LEADCHNL RV IMPEDANCE VALUE: 399 Ohm
MDC IDC MSMT LEADCHNL RV PACING THRESHOLD AMPLITUDE: 1.25 V
MDC IDC SESS DTM: 20170605180756
MDC IDC SET LEADCHNL RV PACING AMPLITUDE: 2.5 V
MDC IDC SET LEADCHNL RV SENSING SENSITIVITY: 0.9 mV
MDC IDC STAT BRADY AP VP PERCENT: 46.1 %
MDC IDC STAT BRADY AP VS PERCENT: 0.01 %
MDC IDC STAT BRADY AS VS PERCENT: 0.12 %

## 2016-04-15 NOTE — Patient Instructions (Signed)
Medication Instructions: - Your physician recommends that you continue on your current medications as directed. Please refer to the Current Medication list given to you today.  Labwork: - none  Procedures/Testing: - none  Follow-Up: - Your physician recommends that you schedule a follow-up appointment in: 1 month with Dr. Caryl Comes.  Any Additional Special Instructions Will Be Listed Below (If Applicable).     If you need a refill on your cardiac medications before your next appointment, please call your pharmacy.

## 2016-04-15 NOTE — Progress Notes (Signed)
R dysautonomia     Patient Care Team: Briscoe Deutscher, MD as PCP - General (Family Medicine)   HPI  Laurie Phillips is a 25 y.o. female followup for complete heart block  and symptoms suggestive of dysautonomia   Physical left leg with symptoms of seeing spots with palpitations. Device interrogation demonstrated sinus tachycardia She is s/p His Bundle pacing and has been exercising to a couple of hours a day  Mood is also mch better  She's having spells of tachycardia. These have been more prominent of late. She thinks they are related to heat.  They are assoc with SOB and some chest discomfort  She also notes some chest tightness when she sits for prolonged periods of time,  No reflux symptoms         Past Medical History  Diagnosis Date  . Headache   . Fibroid   . Sinus bradycardia   . Chest pain   . CHB (complete heart block) (HCC)     s/p Dual chamber: Atrial and HIS pacemaker placement  . Bipolar 1 disorder ([place])   . Generalized anxiety disorder   . Rh negative, antepartum   . Fibroid   . Attention deficit hyperactivity disorder   . Pneumonia   . AVNRT (AV nodal re-entry tachycardia) ([place])   . 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 Tonsillectomy    . Knee arthroscopy Right   . Cesarean section N/A 03/08/2015    Procedure: CESAREAN SECTION;  Surgeon: Everett Graff, MD;  Location: [facility];  Service: Obstetrics;  Laterality: N/A;  . Pacemaker placement      Current Outpatient Prescriptions  Medication Sig Dispense Refill  . Multiple Vitamin (MULTIVITAMIN) tablet Take 1 tablet by mouth daily.    . NON FORMULARY Sodium Chloride 1 tablet BID     No current facility-administered medications for this visit.    No Known Allergies    Review of Systems negative except from HPI and PMH  Physical Exam BP 126/72 mmHg  Pulse 68  Ht 5\' 2"  (1.575 m)  Wt 147 lb (66.679 kg)  BMI 26.88 kg/m2  LMP 03/25/2016 Well  developed and well nourished in no acute distress HENT normal E scleral and icterus clear Neck Supple JVP flat; carotids brisk and full Clear to ausculation Device pocket well healed; without hematoma or erythema.  There is no tethering   *Regular rate and rhythm, no murmurs gallops or rub Soft with active bowel sounds No clubbing cyanosis  Edema Alert and oriented, grossly normal motor and sensory function Skin Warm and Dry    Assessment and  Plan  Complete heart block  Pacemaker Medtronic   dysatuonomia  Psycho social stressors  Tachycardia  Chest pain    She is doing much better with exercise and mood  She's having episodes of tachycardia with what appears to be 1-180 relationships although the atrial signals might be prolonged because of poor    amplitude. We have reprogrammed the atrial signal to unipolar. Today she has neither antegrade or retrograde conduction. Electrogram of the ventricular signal appears to be similar to intrinsic but I don't know whether this is a junctional beat or ventricular beat   Notably last echo 1/17 ejection fraction was normal  I'm not sure the mechanism of her sitting chest pain

## 2016-04-19 LAB — CUP PACEART INCLINIC DEVICE CHECK
Battery Remaining Longevity: 97 mo
Brady Statistic AP VS Percent: 0.01 %
Brady Statistic AS VS Percent: 0.06 %
Brady Statistic RV Percent Paced: 99.93 %
Implantable Lead Implant Date: 20160606
Lead Channel Impedance Value: 399 Ohm
Lead Channel Impedance Value: 627 Ohm
Lead Channel Pacing Threshold Amplitude: 0.75 V
Lead Channel Pacing Threshold Pulse Width: 0.4 ms
Lead Channel Sensing Intrinsic Amplitude: 2.6 mV
Lead Channel Setting Pacing Amplitude: 2 V
Lead Channel Setting Pacing Pulse Width: 0.6 ms
Lead Channel Setting Sensing Sensitivity: 0.9 mV
MDC IDC LEAD IMPLANT DT: 20160606
MDC IDC LEAD LOCATION: 753859
MDC IDC LEAD LOCATION: 753860
MDC IDC MSMT BATTERY VOLTAGE: 3.01 V
MDC IDC MSMT LEADCHNL RA IMPEDANCE VALUE: 304 Ohm
MDC IDC MSMT LEADCHNL RA IMPEDANCE VALUE: 437 Ohm
MDC IDC MSMT LEADCHNL RA SENSING INTR AMPL: 0.875 mV
MDC IDC MSMT LEADCHNL RV PACING THRESHOLD AMPLITUDE: 1.5 V
MDC IDC MSMT LEADCHNL RV PACING THRESHOLD PULSEWIDTH: 0.4 ms
MDC IDC SESS DTM: 20170328180850
MDC IDC SET LEADCHNL RV PACING AMPLITUDE: 2.5 V
MDC IDC STAT BRADY AP VP PERCENT: 48.01 %
MDC IDC STAT BRADY AS VP PERCENT: 51.92 %
MDC IDC STAT BRADY RA PERCENT PACED: 48.02 %

## 2016-04-29 ENCOUNTER — Telehealth: Payer: Self-pay | Admitting: Internal Medicine

## 2016-04-29 NOTE — Telephone Encounter (Signed)
Pt states over the last few days she has had increase in shortness of breath, dizziness and fatigue.  Pt states on Friday 04/26/16 she was outside looking at her garden. Pt states she felt hot outside, went inside to air conditioning, was trying to sit in a chair and lost consciousness, she states she did not injury herself.  Pt states her device was reprogrammed at office visit with Dr Caryl Comes 04/15/16, pt is asking if current symptoms are related to device reprogramming.   Pt advised I will forward this message to [facility] to follow up with her about her recent symptoms and loss of consciousness.

## 2016-04-29 NOTE — Telephone Encounter (Signed)
Follow-up    The pt is calling back to see if we could send the down loaded transmission done earlier today to Dr. Leonides Schanz to his nurse Alyse Low the fax number is [phone].

## 2016-04-29 NOTE — Telephone Encounter (Signed)
To Device

## 2016-04-29 NOTE — Telephone Encounter (Signed)
Transmission received. Several episodes of 1:1 SVT ranging from 30 seconds to 78 seconds that have occurred from 04/22/16- 04/28/16. SR with intermittent pacing at the time of transmission. She reports that she has had SOB at rest, associated with intermittent chest pain. She is highly anxious and tearful while talking with me. She says that she passed out about 10:30- noon on Friday.   I called Dr. Caryl Comes to review symptoms and episodes recorded on PPM. He reports that she was doing relatively well until the weather became hot and it seems that her symptoms have increased with the warmer weather. He requests that Trinidad Curet, RN call with symptom management of autonomic dysfunction. Otherwise, he is unsure of what her chest pain and SOB is related to.  I called Lindy back and explained Dr Olin Pia recommendations and she reports that she has stayed inside all weekend after her episode Friday morning. I again told her that she could be seen in the ER but not to drive herself. She verbalizes understanding and awaits Sherri's call.  Trinidad Curet is agreeable to plan.

## 2016-04-29 NOTE — Telephone Encounter (Signed)
New message    Patient calling C/O heart racing asymptotic - no matter what she does unable caught her breath . Fatigue   Pt c/o Shortness Of Breath: STAT if SOB developed within the last 24 hours or pt is noticeably SOB on the phone  1. Are you currently SOB (can you hear that pt is SOB on the phone)?no   2. How long have you been experiencing SOB?  since she last office visit with Dr. Caryl Comes   3. Are you SOB when sitting or when up moving around? Everything   4. Are you currently experiencing any other symptoms?heart racing / fainiting the other day h/o POTS.

## 2016-04-29 NOTE — Telephone Encounter (Signed)
Stated that Friday she was out in the garden and heat and upon coming inside fainted.  She was aided to the ground by someone at home so she never hit her head or anything.  She does not think she was hydrated enough. States that her SOB has worsened since she saw Dr. Caryl Comes on 6/5.  When she experiences the SOB she then begins to feel dizzy.   States that when all of the above are occurring her heart is pounding.  States it is a pulsating pain.  Patient and I reviewed POTS signs/symptoms. Discussed increasing daily fluid intake to 2L; salt 3-5g minimum/thermatabs; avoiding caffeine and/or ETOH; elevating HOB, abdominal binder, avoiding prolonged standing; change positions slowly, lg glass of water in the morning before beginning daily routine/s;showering in luke warm not too hot/cold; and eating smaller meals in lieu of two or three large ones. She will monitor if symptoms worsen after eating or if correlated to certain foods.  She feels relieved after discussing these things.  States that some her anxiety has been reduced. She thanks me for taking time to talk with her and review information. Advised to call office if symptoms worsen or do not improve. She has OV on 7/3 and agreeable to above stated plan.

## 2016-04-29 NOTE — Telephone Encounter (Signed)
Spoke w/ pt and instructed her to send a manual transmission. Pt verbalized understanding. Transmission received.

## 2016-05-03 ENCOUNTER — Telehealth: Payer: Self-pay | Admitting: Internal Medicine

## 2016-05-03 NOTE — Telephone Encounter (Signed)
Laurie Phillips is a 25 year old female with history of superventricular tachycardia, complete heart block, dysautonomia, anxiety, ADHD, bipolar disorder contacted the on-call cardiology service today with acute onset left sided pleuritic pain. She states that she started having sharp shooting pain wrapping around her left chest. The pain is worse with inspiration and expiration but she denies any shortness of breath. She doesn't recall any coughing spell or obvious injury prior to this. I've asked the patient to keep a very close on her symptoms as they appear to be musculoskeletal in nature at the moment. I asked her to seek immediate medical attention with any shortness of breath.

## 2016-05-05 ENCOUNTER — Encounter (HOSPITAL_COMMUNITY): Payer: Medicaid Other | Attending: Obstetrics and Gynecology

## 2016-05-05 DIAGNOSIS — O923 Agalactia: Secondary | ICD-10-CM | POA: Insufficient documentation

## 2016-05-13 ENCOUNTER — Encounter: Payer: Self-pay | Admitting: Internal Medicine

## 2016-05-13 ENCOUNTER — Ambulatory Visit (INDEPENDENT_AMBULATORY_CARE_PROVIDER_SITE_OTHER): Payer: Medicaid Other | Admitting: Internal Medicine

## 2016-05-13 ENCOUNTER — Encounter: Payer: Self-pay | Admitting: *Deleted

## 2016-05-13 VITALS — BP 115/70 | HR 59 | Ht 62.0 in | Wt 147.2 lb

## 2016-05-13 DIAGNOSIS — G909 Disorder of the autonomic nervous system, unspecified: Secondary | ICD-10-CM | POA: Diagnosis not present

## 2016-05-13 DIAGNOSIS — G901 Familial dysautonomia [Riley-Day]: Secondary | ICD-10-CM

## 2016-05-13 DIAGNOSIS — I442 Atrioventricular block, complete: Secondary | ICD-10-CM

## 2016-05-13 DIAGNOSIS — Z950 Presence of cardiac pacemaker: Secondary | ICD-10-CM | POA: Diagnosis not present

## 2016-05-13 LAB — CUP PACEART INCLINIC DEVICE CHECK
Battery Remaining Longevity: 91 mo
Battery Voltage: 3.01 V
Brady Statistic RA Percent Paced: 51.08 %
Implantable Lead Implant Date: 20160606
Implantable Lead Model: 3830
Implantable Lead Model: 5076
Lead Channel Impedance Value: 304 Ohm
Lead Channel Impedance Value: 380 Ohm
Lead Channel Impedance Value: 418 Ohm
Lead Channel Pacing Threshold Amplitude: 0.625 V
Lead Channel Pacing Threshold Pulse Width: 0.4 ms
Lead Channel Sensing Intrinsic Amplitude: 5.625 mV
Lead Channel Setting Pacing Amplitude: 2.5 V
Lead Channel Setting Sensing Sensitivity: 0.9 mV
MDC IDC LEAD IMPLANT DT: 20160606
MDC IDC LEAD LOCATION: 753859
MDC IDC LEAD LOCATION: 753860
MDC IDC MSMT LEADCHNL RA PACING THRESHOLD PULSEWIDTH: 0.4 ms
MDC IDC MSMT LEADCHNL RV IMPEDANCE VALUE: 627 Ohm
MDC IDC MSMT LEADCHNL RV PACING THRESHOLD AMPLITUDE: 1.25 V
MDC IDC MSMT LEADCHNL RV SENSING INTR AMPL: 2.875 mV
MDC IDC SESS DTM: 20170703154643
MDC IDC SET LEADCHNL RA PACING AMPLITUDE: 2 V
MDC IDC SET LEADCHNL RV PACING PULSEWIDTH: 0.6 ms
MDC IDC STAT BRADY AP VP PERCENT: 51.06 %
MDC IDC STAT BRADY AP VS PERCENT: 0.02 %
MDC IDC STAT BRADY AS VP PERCENT: 43.11 %
MDC IDC STAT BRADY AS VS PERCENT: 5.81 %
MDC IDC STAT BRADY RV PERCENT PACED: 94.17 %

## 2016-05-13 MED ORDER — FLUDROCORTISONE ACETATE 0.1 MG PO TABS: 0.1000 mg | ORAL_TABLET | Freq: Two times a day (BID) | ORAL | Status: DC

## 2016-05-13 NOTE — Patient Instructions (Signed)
Medication Instructions: - Your physician has recommended you make the following change in your medication:  1) Start florinef 0.1 mg one tablet by mouth twice daily  Labwork: - Your physician recommends that you return for lab work in: 2 weeks- BMP  Procedures/Testing: - none  Follow-Up: - Your physician wants you to follow-up in: 3 months with Chanetta Marshall, NP for Dr. Caryl Comes. You will receive a reminder letter in the mail two months in advance. If you don't receive a letter, please call our office to schedule the follow-up appointment.  Any Additional Special Instructions Will Be Listed Below (If Applicable).     If you need a refill on your cardiac medications before your next appointment, please call your pharmacy.

## 2016-05-13 NOTE — Addendum Note (Signed)
Addended by: Campbell Riches on: 05/13/2016 03:19 PM   Modules accepted: Orders

## 2016-05-13 NOTE — Progress Notes (Signed)
R dysautonomia     Patient Care Team: Briscoe Deutscher, MD as PCP - General (Family Medicine)   HPI  Laurie Phillips is a 25 y.o. female followup for complete heart block  and symptoms suggestive of dysautonomia   Physical left leg with symptoms of seeing spots with palpitations. Device interrogation demonstrated sinus tachycardia She is s/p His Bundle pacing and has been exercising to a couple of hours a day   She's having spells of tachycardia. These have been more prominent of late. She thinks they are related to heat.  They are assoc with SOB and some chest discomfort  She also notes some chest tightness when she sits for prolonged periods of time,  No reflux symptoms  Her symptoms are worse around the time of her menses.  She is struggling emotionally.         Past Medical History  Diagnosis Date  . Headache   . Fibroid   . Sinus bradycardia   . Chest pain   . CHB (complete heart block) (HCC)     s/p Dual chamber: Atrial and HIS pacemaker placement  . Bipolar 1 disorder ([place])   . Generalized anxiety disorder   . Rh negative, antepartum   . Fibroid   . Attention deficit hyperactivity disorder   . Pneumonia   . AVNRT (AV nodal re-entry tachycardia) ([place])   . 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 Tonsillectomy    . Knee arthroscopy Right   . Cesarean section N/A 03/08/2015    Procedure: CESAREAN SECTION;  Surgeon: Everett Graff, MD;  Location: [facility];  Service: Obstetrics;  Laterality: N/A;  . Pacemaker placement      Current Outpatient Prescriptions  Medication Sig Dispense Refill  . Multiple Vitamin (MULTIVITAMIN) tablet Take 1 tablet by mouth daily.    . NON FORMULARY Take 1 tablet by mouth 2 (two) times daily. Sodium Chloride     No current facility-administered medications for this visit.    No Known Allergies    Review of Systems negative except from HPI and PMH  Physical Exam BP 115/70 mmHg   Pulse 59  Ht 5\' 2"  (1.575 m)  Wt 147 lb 3.2 oz (66.769 kg)  BMI 26.92 kg/m2  SpO2 98%  LMP 04/21/2016 (Approximate)  Breastfeeding? No Well developed and well nourished in no acute distress HENT normal E scleral and icterus clear Neck Supple JVP flat; carotids brisk and full Clear to ausculation Device pocket well healed; without hematoma or erythema.  There is no tethering   *Regular rate and rhythm, no murmurs gallops or rub Soft with active bowel sounds No clubbing cyanosis  Edema Alert and oriented, grossly normal motor and sensory function Skin Warm and Dry  ECG AV pacing  Assessment and  Plan  Complete heart block  Pacemaker Medtronic   dysatuonomia  Psycho social stressors  Tachycardia  Chest pain   Atrial tachycardia-false positive   She continues with atypical chest pain  Her fluid and salt intake is replete; we will add Florinef 0.1 mg twice daily.  We will check a metabolic profile in 2 weeks.  I've encouraged her to follow-up with her gynecologist (control as her symptoms are clearly worse around the time of her menses  I've suggested she see  [facility]

## 2016-05-27 ENCOUNTER — Other Ambulatory Visit: Payer: Medicaid Other

## 2016-06-04 ENCOUNTER — Encounter (HOSPITAL_COMMUNITY)
Admission: RE | Admit: 2016-06-04 | Discharge: 2016-06-04 | Disposition: A | Discharge status: Home / Self Care | Payer: Self-pay | Source: Ambulatory Visit | Attending: Obstetrics and Gynecology | Admitting: Obstetrics and Gynecology

## 2016-07-04 ENCOUNTER — Encounter (HOSPITAL_COMMUNITY)
Admission: RE | Admit: 2016-07-04 | Discharge: 2016-07-04 | Disposition: A | Discharge status: Home / Self Care | Payer: Self-pay | Source: Ambulatory Visit | Attending: Obstetrics and Gynecology | Admitting: Obstetrics and Gynecology

## 2016-08-05 ENCOUNTER — Encounter (HOSPITAL_COMMUNITY)
Admission: RE | Admit: 2016-08-05 | Discharge: 2016-08-05 | Disposition: A | Discharge status: Home / Self Care | Payer: Self-pay | Source: Ambulatory Visit | Attending: Obstetrics and Gynecology | Admitting: Obstetrics and Gynecology

## 2016-09-04 ENCOUNTER — Encounter (HOSPITAL_COMMUNITY)
Admission: RE | Admit: 2016-09-04 | Discharge: 2016-09-04 | Disposition: A | Discharge status: Home / Self Care | Payer: Self-pay | Source: Ambulatory Visit | Attending: Obstetrics and Gynecology | Admitting: Obstetrics and Gynecology

## 2016-09-08 NOTE — Progress Notes (Signed)
Cardiology Office Note Date:  09/09/2016  Patient ID:  Laurie Phillips, DOB [birth year], MRN [id number] PCP:  Abigail Miyamoto, MD  Electrophysiologist:  Dr. Caryl Comes   Chief Complaint: planned 3 month f/u  History of Present Illness: Laurie Phillips is a 25 y.o. female with history of CHB post pregnancy, w/PPM (RV lead in HIS position), dysautonomia (symotoms exacerbated with menses), Bipolar disorder, [place], with a hospital admission in Jan 2017, noting past medical history significant for complete heart block post pregnancy. She underwent His bundle pacing by Dr Lujean Rave at [facility] post delivery. Since then, she has had persistent shortness of breath and chest pain despite multiple device reprogrammings. She presented to the ER on the day of admission with recurrent chest and throat pain that was not associated with exertion and persistent.  Increasing and decreasing AV delays did not help, lowering the base rate did not help. She underwent CT scan of the chest with no PE noted. Echocardiogram demonstrated normal LV function.  GXT showed poor exercise tolerance but no SVT or upper rate pacemaker behavior noted. She did have ST with standing.  She was seen by Dr Caryl Comes who felt that her symptoms were most likely related to dysautonomia.  She comes in to the office today to be seen for Dr. Caryl Comes.  She was last seen by him July, at that time started on florinef, since then doing better from palpitations and symptoms with position change.  She mentions though that the [place] gave her a terrible headache and only took it about a week.  She reports not dizziness/palpitations, but exertional SOB and states seems to take her a long time to recover her breath even after minimal exertion.  She reports this as dating back many months, though perhaps worse, she did though have a URI cough/cold type syndrome she thinks contributed.  No CP outside of coughing with her cold, and mentions when she hunches forward and bears  down/flexes her torso, she will feel a CP.  She denies symptoms of PND or orthopnea, reports rest SOB, but after discussion this is after exertion feeling it takes a long time to resolve.  She does not spontaneously feel SOB at rest.  She denies pregnancy or the possibility of pregnancy.  She wears her abdominal binder faithfully and in general feels better except for the SOB.  Her weight is up, she denies feeling bloated or noting edema at all, feels like the weight gain has been a few pounds at a time over the last few months.  Prior cardiac testing 1.  Echocardiogram 12/04/15 demonstrated EF 55-60%, no RWMA, PA pressure 28  2.  GXT 12/04/15 demonstrated exercise tolerance of 14minutes 29 seconds, no SVT noted, some ST with standing, no upper rate pacemaker behavior 3. Exercise stress test 11/30/15  Blood pressure demonstrated a hypotensive response to exercise.  There was no ST segment deviation noted during stress.    Stress Findings  ECG Baseline ECG exhibits normal sinus rhythm..    Stress Findings The patient exercised following the Bruce protocol.  The patient reported dyspnea during the stress test. The patient experienced no angina during the stress test.  Heart rate demonstrated a normal response to exercise. Blood pressure demonstrated a hypotensive response to exercise.   The patient's response to exercise was adequate for diagnosis. The test was important looking at pacing behavior at higher rates. Her P-wave in first into PVARP and then into PVAB. I will reprogrammed Pvarp to minimal I decreased PVAB  to minimal and I activated dynamic AV delay so that her minimum AV delay-110 ms plus her P for 150 ms would allow her to have heart rates up to 200+ beats per minute    Response to Stress There was no ST segment deviation noted during stress.  Arrhythmias during stress: sinus tachycardia.  ECG was interpretable.  SEE ABOVE      DEVICE information: MDT dual chamber PPM, implanted  04/17/15 100% V paced   Past Medical History:  Diagnosis Date  . Attention deficit hyperactivity disorder   . AVNRT (AV nodal re-entry tachycardia) ([place])   . Bipolar 1 disorder ([place])   . CHB (complete heart block) ([facility])    s/p Dual chamber: Atrial and HIS pacemaker placement  . Chest pain   . Fibroid   . Fibroid   . Generalized anxiety disorder   . Headache   . 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 Pneumonia   . Rh negative, antepartum   . Sinus bradycardia     Past Surgical History:  Procedure Laterality Date  . CESAREAN SECTION N/A 03/08/2015   Procedure: CESAREAN SECTION;  Surgeon: Everett Graff, MD;  Location: [facility];  Service: Obstetrics;  Laterality: N/A;  . KNEE ARTHROSCOPY Right   . PACEMAKER PLACEMENT    . TONSILLECTOMY      No current outpatient prescriptions on file.   No current facility-administered medications for this visit.     Allergies:   Review of patient's allergies indicates no known allergies.   Social History:  The patient  reports that she quit smoking about 5 years ago. She has never used smokeless tobacco. She reports that she does not drink alcohol or use drugs.   Family History:  The patient's She was adopted. Family history is unknown by patient.  ROS:  Please see the history of present illness.  All other systems are reviewed and otherwise negative.   PHYSICAL EXAM: VS:  BP 110/68   Pulse 60   Ht 5\' 2"  (1.575 m)   Wt 165 lb (74.8 kg)   BMI 30.18 kg/m  BMI: Body mass index is 30.18 kg/m. Well nourished, well developed, in no acute distress  HEENT: normocephalic, atraumatic  Neck: no JVD, carotid bruits or masses Cardiac: RRR; no significant murmurs, no rubs, or gallops Lungs:  clear to auscultation bilaterally, no wheezing, rhonchi or rales  Abd: soft, nontender MS: no deformity or atrophy Ext: no edema  Skin: warm and dry, no rash Neuro:  No gross deficits appreciated Psych: euthymic mood, full affect  PPM site is stable, no  tethering or discomfort   EKG:  Done 06/09/16 showed AV paced rhythm, today's EKG is reviewed by myself is AV paced at 60. PPM interrogation today and reviewed by myself: stable battery/lead status, AMS episodes again appear to be far-field sensing.  Recent Labs: 12/03/2015: Hemoglobin 14.6; Platelets 351 12/04/2015: BUN 10; Creatinine, Ser 0.80; Magnesium 2.1; Potassium 4.9; Sodium 138  No results found for requested labs within last 8760 hours.   CrCl cannot be calculated (Patient's most recent lab result is older than the maximum 21 days allowed.).   Wt Readings from Last 3 Encounters:  09/09/16 165 lb (74.8 kg)  05/13/16 147 lb 3.2 oz (66.8 kg)  04/15/16 147 lb (66.7 kg)     Other studies reviewed: Additional studies/records reviewed today include: summarized above  ASSESSMENT AND PLAN:  1.post-partum CHB w/PPM    stable function, far field sensing noted again, blanking was adjusted and AF detection  increased to 200bpm  2. Dysautonomia     Palpitations, lightheadedness are reported as fairly well controlled though c/w DOE and feels it takes her a long time to catch her breath after minimal exertion.    3. DOE Her exam does not suggest fluid OL at all.  She is asked to monitor her weights daily, and monitor her diet.  She only took the florinef for a week, none in months. Not taking any meds, reports keeping well hydrated but also report excellent urine OP  She is instructed to avoid provoking maneuvers she desccribed that cause CP, also sounds like a vagal maneuver and may provoke other symptoms.  She isn't certain why she does it.   Disposition:Given her hx and previously multiple device reprogramming to improve her symptoms historically, will discuss with dr. Caryl Comes for further recommendations, otherwise she is asked to monitor her weight, symptoms and f/u in 67months, sooner if needed.  Current medicines are reviewed at length with the patient today.    Haywood Lasso,  PA-C 09/09/2016 4:24 PM     [address] [place] [address] [phone] (office)  [phone] (fax)

## 2016-09-09 ENCOUNTER — Ambulatory Visit (INDEPENDENT_AMBULATORY_CARE_PROVIDER_SITE_OTHER): Payer: Medicaid Other | Admitting: Physician Assistant

## 2016-09-09 ENCOUNTER — Encounter: Payer: Self-pay | Admitting: Physician Assistant

## 2016-09-09 ENCOUNTER — Encounter (INDEPENDENT_AMBULATORY_CARE_PROVIDER_SITE_OTHER): Payer: Self-pay

## 2016-09-09 VITALS — BP 110/68 | HR 60 | Ht 62.0 in | Wt 165.0 lb

## 2016-09-09 DIAGNOSIS — R0600 Dyspnea, unspecified: Secondary | ICD-10-CM

## 2016-09-09 DIAGNOSIS — I442 Atrioventricular block, complete: Secondary | ICD-10-CM

## 2016-09-09 DIAGNOSIS — G909 Disorder of the autonomic nervous system, unspecified: Secondary | ICD-10-CM | POA: Diagnosis not present

## 2016-09-09 DIAGNOSIS — G901 Familial dysautonomia [Riley-Day]: Secondary | ICD-10-CM

## 2016-09-09 NOTE — Patient Instructions (Signed)
Medication Instructions:   Your physician recommends that you continue on your current medications as directed. Please refer to the Current Medication list given to you today.   If you need a refill on your cardiac medications before your next appointment, please call your pharmacy.  Labwork: NONE ORDERED  TODAY    Testing/Procedures: NONE ORDERED  TODAY    Follow-Up: IN 3 TO 4 MONTHS WITH KLEIN OR WITH URSUY ON DAY KLEIN IN OFFICE  \Remote monitoring is used to monitor your Pacemaker of ICD from home. This monitoring reduces the number of office visits required to check your device to one time per year. It allows [place] to keep an eye on the functioning of your device to ensure it is working properly. You are scheduled for a device check from home on .Marland KitchenMarland Kitchen1/30/17..You may send your transmission at any time that day. If you have a wireless device, the transmission will be sent automatically. After your physician reviews your transmission, you will receive a postcard with your next transmission date.   Any Other Special Instructions Will Be Listed Below (If Applicable).

## 2016-09-12 ENCOUNTER — Telehealth: Payer: Self-pay | Admitting: Physician Assistant

## 2016-09-12 NOTE — Telephone Encounter (Signed)
Called the patient after reviewing her case with Dr. Caryl Comes.  Given overall she has had generalized improvement, no changes.  She tells me that she went for a walk today and did feel a little SOB but "nothing significant" and overall felt well and was happy. (to Note: we discussed in Epic it appears she has a bresst pump from [facility], she assured me she is not breast feeding or pumping.   She will keep her scheduled appointment with Dr. Caryl Comes early next year, sooner if needed.  Tommye Standard, PA-C

## 2016-10-07 IMAGING — US US OB COMP +14 WK
1 series · 12 of 28 positions shown · non-contrast
Comparison: none

OBSTETRICS REPORT
(Signed Final 03/08/2015 [DATE])

Service(s) Provided
US OB COMP + 14 WK                                    76805.1
Indications
Basic anatomic survey                                 z36
Pre-eclampsia (on mag)
36 weeks gestation of pregnancy
Fetal Evaluation
Num Of Fetuses:    1
Fetal Heart Rate:  134                          bpm
Cardiac Activity:  Observed
Presentation:      Cephalic
Placenta:          Posterior, above cervical
os
P. Cord            Not well visualized
Insertion:
Amniotic Fluid
AFI FV:      Subjectively within normal limits
AFI Sum:     11.4    cm       32  %Tile     Larg Pckt:    4.65  cm
RUQ:   4.65    cm   RLQ:    2.83   cm    LLQ:   3.92    cm
Biometry
BPD:       88  mm     G. Age:  35w 4d                CI:        70.98   70 - 86
FL/HC:      18.8   20.1 -
22.1
HC:     332.8  mm     G. Age:  38w 0d       68  %    HC/AC:      1.05   0.93 -
1.11
AC:     316.8  mm     G. Age:  35w 4d       49  %    FL/BPD:     71.0   71 - 87
FL:      62.5  mm     G. Age:  32w 2d      < 3  %    FL/AC:      19.7   20 - 24
HUM:     55.8  mm     G. Age:  32w 3d      < 5  %
Est. FW:    7181  gm    5 lb 10 oz      38  %
Gestational Age
LMP:           36w 0d        Date:  06/28/14                 EDD:   04/04/15
U/S Today:     35w 3d                                        EDD:   04/08/15
Best:          36w 0d     Det. By:  LMP  (06/28/14)          EDD:   04/04/15
Anatomy
Cranium:          Appears normal         Aortic Arch:      Not well visualized
Fetal Cavum:      Not well visualized    Ductal Arch:      Not well visualized
Ventricles:       Not well visualized    Diaphragm:        Appears normal
Choroid Plexus:   Not well visualized    Stomach:          Appears normal, left
sided
Cerebellum:       Not well visualized    Abdomen:          Appears normal
Posterior Fossa:  Not well visualized    Abdominal Wall:   Not well visualized
Nuchal Fold:      Not applicable (>20    Cord Vessels:     Not well visualized
wks GA)
Face:             Orbits appear          Kidneys:          Appear normal
normal
Lips:             Not well visualized    Bladder:          Appears normal
Heart:            Appears normal         Spine:            Not well visualized
(4CH, axis, and
situs)
RVOT:             Appears normal         Lower             Not well visualized
Extremities:
LVOT:             Not well visualized    Upper             Not well visualized
Other:  Male gender. Technically difficult due to advanced gestational age.
Cervix Uterus Adnexa
Cervix:       Not visualized (advanced GA >08wks)
Uterus:       No abnormality visualized.
Cul De Sac:   No free fluid seen.
Left Ovary:    Not visualized.
Right Ovary:   Within normal limits.
Adnexa:     No abnormality visualized.
Impression
INDICATION: 23 yr old [id number] at 70w6d with preeclampsia for
fetal ultrasound. Remote read.

[Series 1: us ob +14 all · 12 of 57 slices shown]
[im 3/57]
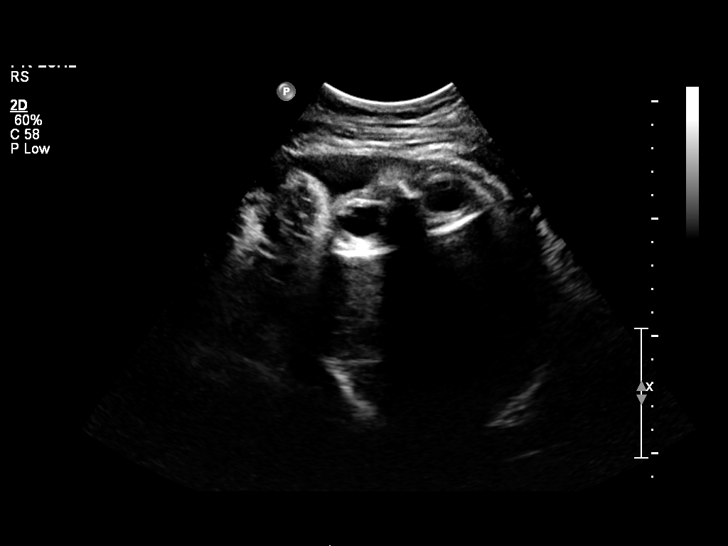
[im 7/57]
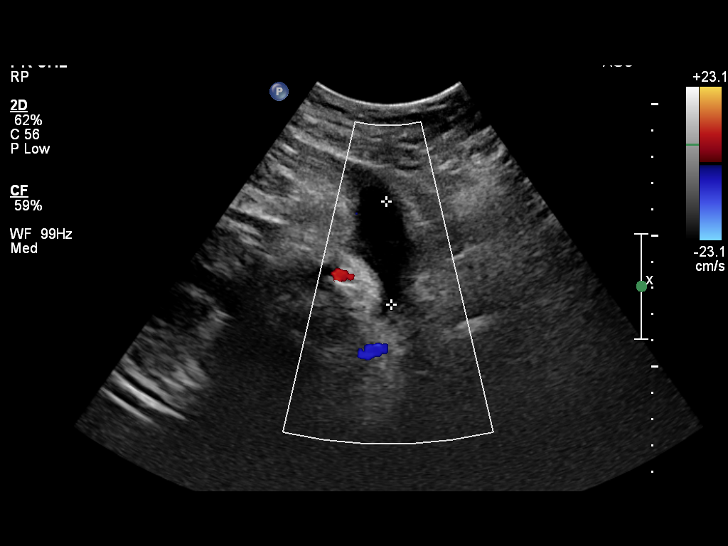
[im 11/57]
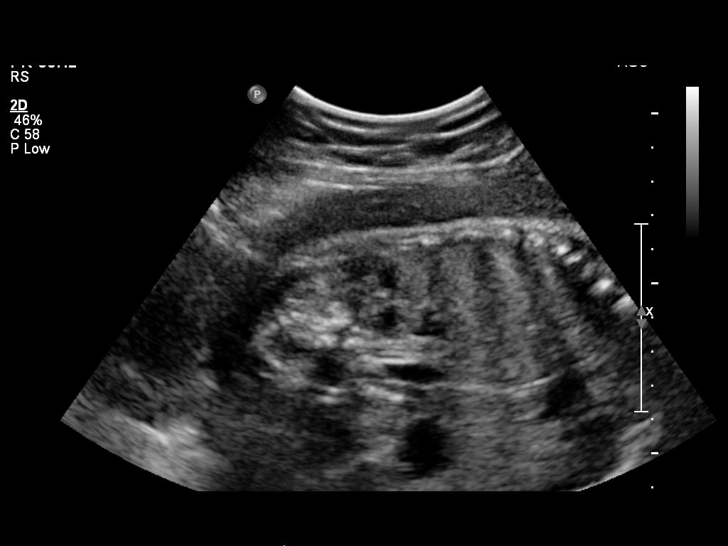
[im 17/57]
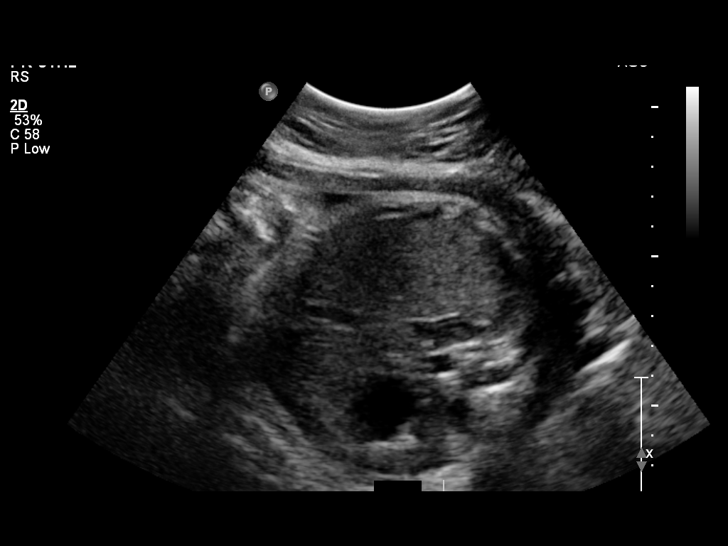
[im 21/57]
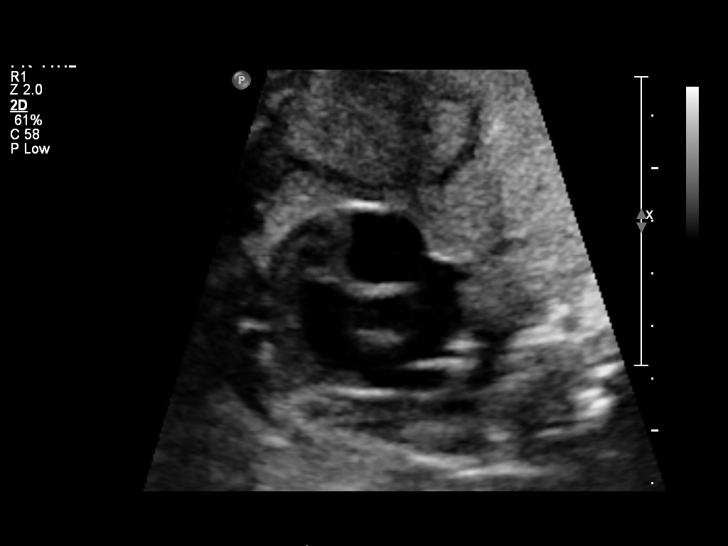
[im 25/57]
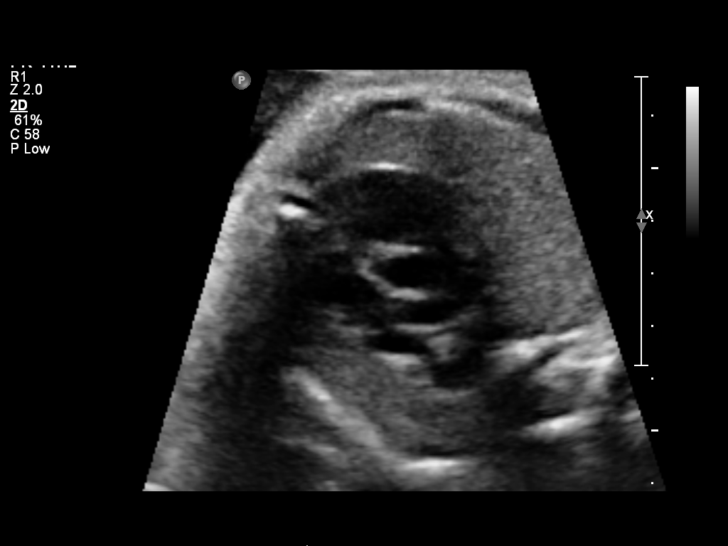
[im 32/57]
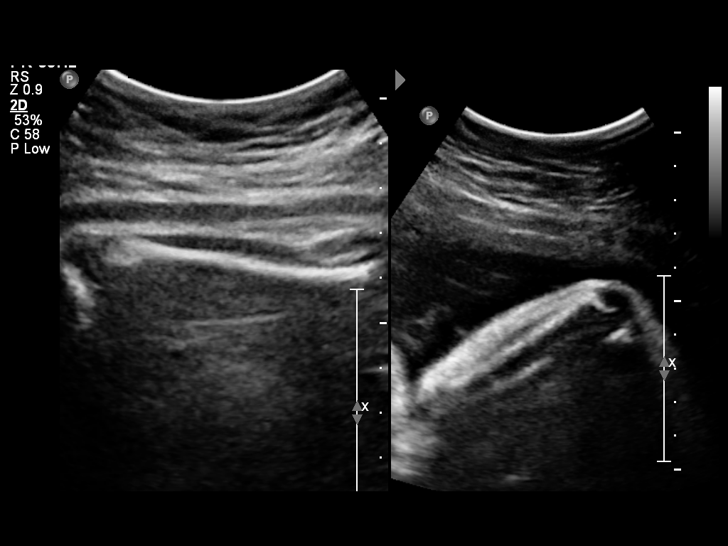
[im 36/57]
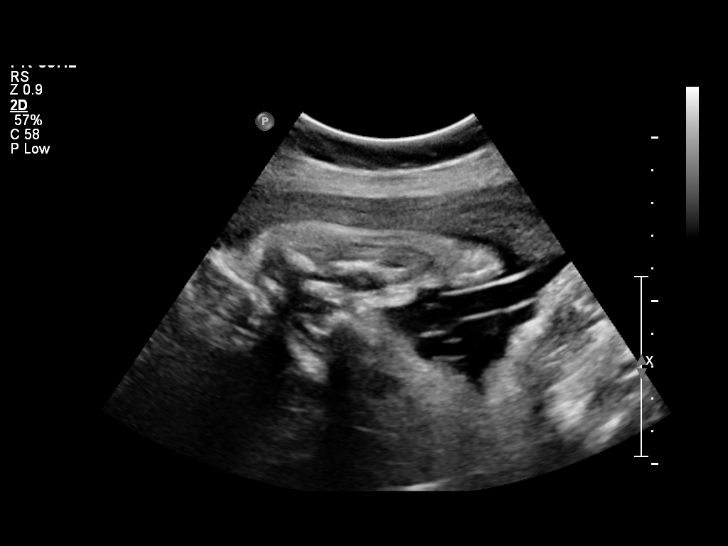
[im 40/57]
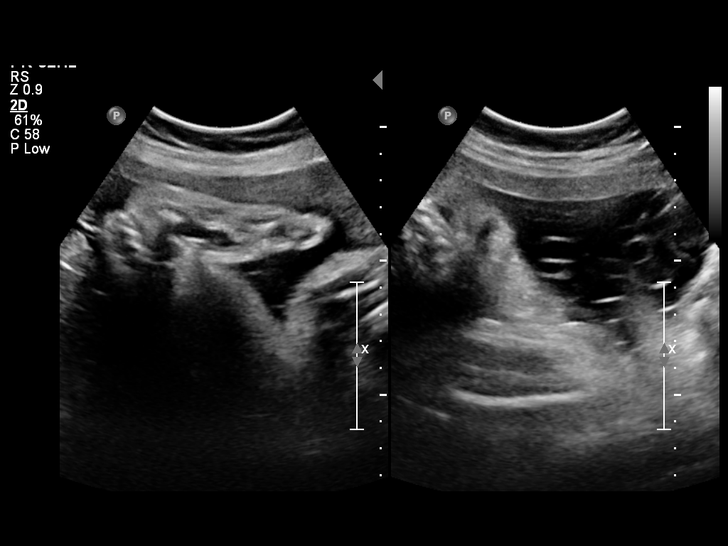
[im 46/57]
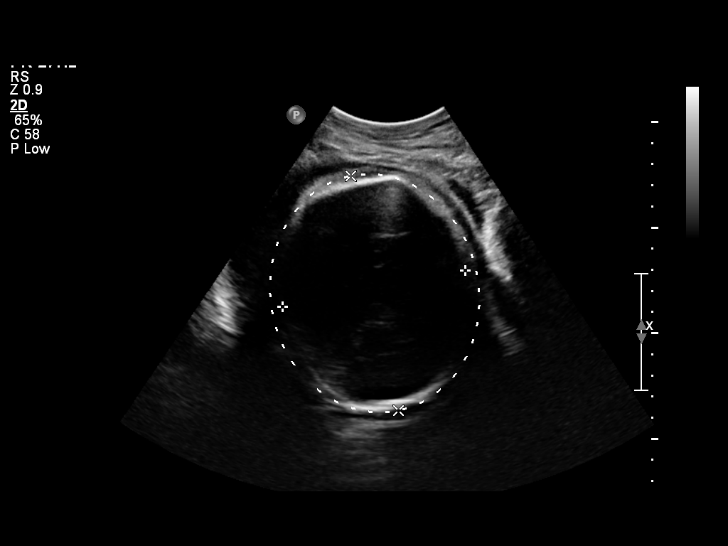
[im 50/57]
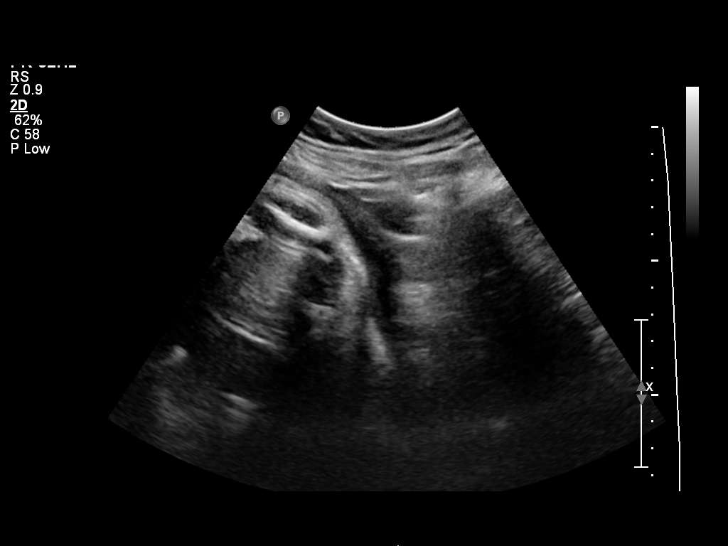
[im 54/57]
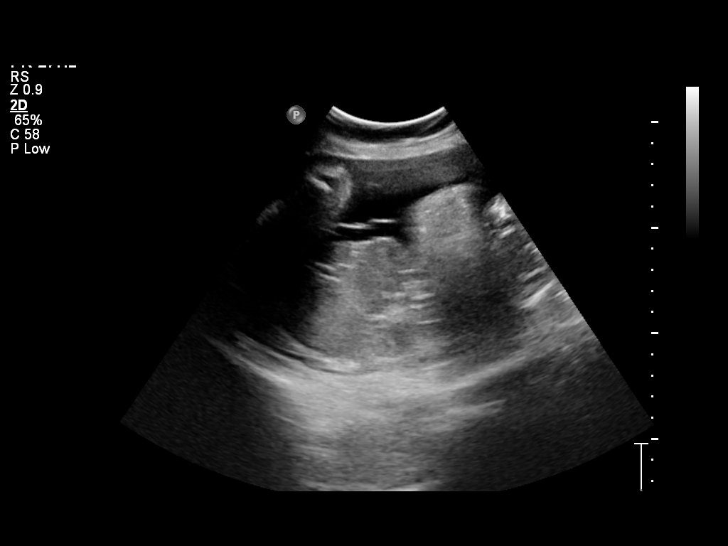

[12 of 28 positions shown; findings below may reference images not displayed]

FINDINGS: 1. Single intrauterine pregnancy.
2. Estimated fetal weight is in the 38th%.
3. Posterior placenta without evidence of preiva.
4. Normal amnioitic fluid index.
5. The anatomy survey is extremely limited by advanced
gestational age; no abnormalities are seen.
Recommendations

1. Appropriate fetal growth.
2. Preeclampsia:
- management per primary OB
3. Normal limited anatomy survey

questions or concerns.

## 2016-10-25 NOTE — H&P (Signed)
  Laurie Phillips is an 25 y.o. female.   Chief Complaint: LEFT WRIST VOLAR CARPAL GANGLION CYST HPI: PATIENT HAS HAD A GANGLION CYST PRESENT FOR OVER 13 YEARS.  THE CYST HAS BEEN POPPED SEVERAL TIMES THROUGHOUT THE YEARS WHEN THE PATIENT HAS ACCIDENTALLY HIT HER HAND ON THINGS.  THE CYST HAS STARTED TO CAUSE PROBLEMS WITH HER ACTIVITIES OF DAILY LIVING AND SHE WOULD LIKE TO PROCEED WITH SURGICAL EXCISION OF THE CYST. THE PATIENT IS HERE TODAY FOR SURGERY.   Past Medical History:  Diagnosis Date  . Attention deficit hyperactivity disorder   . AVNRT (AV nodal re-entry tachycardia) ([place])   . Bipolar 1 disorder ([place])   . CHB (complete heart block) ([facility])    s/p Dual chamber: Atrial and HIS pacemaker placement  . Chest pain   . Fibroid   . Fibroid   . Generalized anxiety disorder   . Headache   . 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 Pneumonia   . Rh negative, antepartum   . Sinus bradycardia     Past Surgical History:  Procedure Laterality Date  . CESAREAN SECTION N/A 03/08/2015   Procedure: CESAREAN SECTION;  Surgeon: Everett Graff, MD;  Location: [facility];  Service: Obstetrics;  Laterality: N/A;  . KNEE ARTHROSCOPY Right   . PACEMAKER PLACEMENT    . TONSILLECTOMY      Family History  Problem Relation Age of Onset  . Adopted: Yes  . Family history unknown: Yes   Social History:  reports that she quit smoking about 5 years ago. She has never used smokeless tobacco. She reports that she does not drink alcohol or use drugs.  Allergies: No Known Allergies  No prescriptions prior to admission.    No results found for this or any previous visit (from the past 48 hour(s)). No results found.  ROS NO RECENT ILLNESSES OR HOSPITALIZATIONS  not currently breastfeeding. Physical Exam  General Appearance:  Alert, cooperative, no distress, appears stated age  Head:  Normocephalic, without obvious abnormality, atraumatic  Eyes:  Pupils equal, conjunctiva/corneas clear,         Throat:  Lips, mucosa, and tongue normal; teeth and gums normal  Neck: No visible masses     Lungs:   respirations unlabored  Chest Wall:  No tenderness or deformity  Heart:  Regular rate and rhythm,  Abdomen:   Soft, non-tender,         Extremities: LUE: VOLAR CARPAL GANGLION OVER RADIAL ASPECT GOOD WRIST AND FOREARM ROTATION NVI LEFT HAND  Pulses: 2+ and symmetric  Skin: Skin color, texture, turgor normal, no rashes or lesions     Neurologic: Normal    Assessment LEFT WRIST VOLAR CARPAL GANGLION CYST  Plan LEFT WRIST VOLAR CARPAL GANGLION CYST EXCISION  R/B/A DISCUSSED WITH PT IN OFFICE.  PT VOICED UNDERSTANDING OF PLAN CONSENT SIGNED DAY OF SURGERY PT SEEN AND EXAMINED PRIOR TO OPERATIVE PROCEDURE/DAY OF SURGERY SITE MARKED. QUESTIONS ANSWERED WILL GO HOME FOLLOWING SURGERY  WE ARE PLANNING SURGERY FOR YOUR UPPER EXTREMITY. THE RISKS AND BENEFITS OF SURGERY INCLUDE BUT NOT LIMITED TO BLEEDING INFECTION, DAMAGE TO NEARBY NERVES ARTERIES TENDONS, FAILURE OF SURGERY TO ACCOMPLISH ITS INTENDED GOALS, PERSISTENT SYMPTOMS AND NEED FOR FURTHER SURGICAL INTERVENTION. WITH THIS IN MIND WE WILL PROCEED. I HAVE DISCUSSED WITH THE PATIENT THE PRE AND POSTOPERATIVE REGIMEN AND THE DOS AND DON'TS. PT VOICED UNDERSTANDING AND INFORMED CONSENT SIGNED.  Laurie Phillips 10/25/2016, 10:35 AM

## 2016-10-31 ENCOUNTER — Encounter (HOSPITAL_COMMUNITY): Payer: Self-pay

## 2016-10-31 ENCOUNTER — Encounter (HOSPITAL_COMMUNITY)
Admission: RE | Admit: 2016-10-31 | Discharge: 2016-10-31 | Disposition: A | Discharge status: Home / Self Care | Payer: Medicaid Other | Source: Ambulatory Visit | Attending: Orthopedic Surgery | Admitting: Orthopedic Surgery

## 2016-10-31 DIAGNOSIS — Z950 Presence of cardiac pacemaker: Secondary | ICD-10-CM | POA: Insufficient documentation

## 2016-10-31 DIAGNOSIS — Z01812 Encounter for preprocedural laboratory examination: Secondary | ICD-10-CM | POA: Diagnosis present

## 2016-10-31 DIAGNOSIS — M67432 Ganglion, left wrist: Secondary | ICD-10-CM | POA: Insufficient documentation

## 2016-10-31 DIAGNOSIS — F319 Bipolar disorder, unspecified: Secondary | ICD-10-CM | POA: Diagnosis not present

## 2016-10-31 HISTORY — DX: Other complications of anesthesia, initial encounter: T88.59XA

## 2016-10-31 HISTORY — DX: Major depressive disorder, single episode, unspecified: F32.9

## 2016-10-31 HISTORY — DX: Other reaction to spinal and lumbar puncture: G97.1

## 2016-10-31 HISTORY — DX: Adverse effect of unspecified anesthetic, initial encounter: T41.45XA

## 2016-10-31 HISTORY — DX: Nocturia: R35.1

## 2016-10-31 HISTORY — DX: Dyspnea, unspecified: R06.00

## 2016-10-31 HISTORY — DX: Weakness: R53.1

## 2016-10-31 HISTORY — DX: Depression, unspecified: F32.A

## 2016-10-31 HISTORY — DX: Frequency of micturition: R35.0

## 2016-10-31 HISTORY — DX: Family history of other specified conditions: Z84.89

## 2016-10-31 HISTORY — DX: Tuberculosis of spine: A18.01

## 2016-10-31 LAB — CBC
HCT: 38.3 % (ref 36.0–46.0)
HEMOGLOBIN: 13.1 g/dL (ref 12.0–15.0)
MCH: 30.3 pg (ref 26.0–34.0)
MCHC: 34.2 g/dL (ref 30.0–36.0)
MCV: 88.7 fL (ref 78.0–100.0)
Platelets: 308 10*3/uL (ref 150–400)
RBC: 4.32 MIL/uL (ref 3.87–5.11)
RDW: 12.7 % (ref 11.5–15.5)
WBC: 7.6 10*3/uL (ref 4.0–10.5)

## 2016-10-31 LAB — HCG, SERUM, QUALITATIVE: PREG SERUM: NEGATIVE

## 2016-10-31 LAB — BASIC METABOLIC PANEL
ANION GAP: 9 (ref 5–15)
BUN: 10 mg/dL (ref 6–20)
CALCIUM: 9.2 mg/dL (ref 8.9–10.3)
CO2: 20 mmol/L — ABNORMAL LOW (ref 22–32)
Chloride: 108 mmol/L (ref 101–111)
Creatinine, Ser: 0.87 mg/dL (ref 0.44–1.00)
GLUCOSE: 96 mg/dL (ref 65–99)
Potassium: 4.2 mmol/L (ref 3.5–5.1)
SODIUM: 137 mmol/L (ref 135–145)

## 2016-10-31 MED ORDER — CHLORHEXIDINE GLUCONATE 4 % EX LIQD: 60.0000 mL | Freq: Once | CUTANEOUS | Status: DC

## 2016-10-31 NOTE — Pre-Procedure Instructions (Signed)
Laurie Phillips  10/31/2016      [id number] - [place], [place] - [address] [place] [address] Phone: [phone] Fax: [phone]    Your procedure is scheduled on Sat, Dec 30 @ 12:05 PM  Report to [facility] Admitting at 10:00 AM  Call this number if you have problems the morning of surgery:  [phone]   Remember:  Do not eat food or drink liquids after midnight.               Stop taking your Ibuprofen a week prior to surgery. No Goody's,BC's,Aleve,Advil,Motrin,Fish Oil,or any Herbal Medications.    Do not wear jewelry, make-up or nail polish.  Do not wear lotions, powders, perfumes, or deoderant.  Do not shave 48 hours prior to surgery.   Do not bring valuables to the hospital.  [facility] is not responsible for any belongings or valuables.  Contacts, dentures or bridgework may not be worn into surgery.  Leave your suitcase in the car.  After surgery it may be brought to your room.  For patients admitted to the hospital, discharge time will be determined by your treatment team.  Patients discharged the day of surgery will not be allowed to drive home.    [facility]  Before surgery, you can play an important role.  Because skin is not sterile, your skin needs to be as free of germs as possible.  You can reduce the number of germs on you skin by washing with CHG (chlorahexidine gluconate) soap before surgery.  CHG is an antiseptic cleaner which kills germs and bonds with the skin to continue killing germs even after washing.  Please DO NOT use if you have an allergy to CHG or antibacterial soaps.  If your skin becomes reddened/irritated stop using the CHG and inform your nurse when you arrive at Short Stay.  Do not shave (including legs and underarms) for at least 48 hours prior to the first CHG shower.  You may shave your face.  Please follow these  instructions carefully:   1.  Shower with CHG Soap the night before surgery and the                                morning of Surgery.  2.  If you choose to wash your hair, wash your hair first as usual with your       normal shampoo.  3.  After you shampoo, rinse your hair and body thoroughly to remove the                      Shampoo.  4.  Use CHG as you would any other liquid soap.  You can apply chg directly       to the skin and wash gently with scrungie or a clean washcloth.  5.  Apply the CHG Soap to your body ONLY FROM THE NECK DOWN.        Do not use on open wounds or open sores.  Avoid contact with your eyes,       ears, mouth and genitals (private parts).  Wash genitals (private parts)       with your normal soap.  6.  Wash thoroughly, paying special attention to the area where your surgery  will be performed.  7.  Thoroughly rinse your body with warm water from the neck down.  8.  DO NOT shower/wash with your normal soap after using and rinsing off       the CHG Soap.  9.  Pat yourself dry with a clean towel.            10.  Wear clean pajamas.            11.  Place clean sheets on your bed the night of your first shower and do not        sleep with pets.  Day of Surgery  Do not apply any lotions/deoderants the morning of surgery.  Please wear clean clothes to the hospital/surgery center.    Please read over the following fact sheets that you were given. Pain Booklet, Coughing and Deep Breathing and Surgical Site Infection Prevention

## 2016-10-31 NOTE — Progress Notes (Addendum)
Saw Dr.Sun on 09/01/15 who put pacemaker in   [place] on 04/15/16  Multiple echo reports in epic   Stress test report in epic from 11-2015  EKG in epic from 09-09-16  CXR denies in past yr  Heart cath denies  Medical Md is Dr.Robert Maceo Pro

## 2016-11-01 NOTE — Progress Notes (Signed)
Anesthesia chart review: Patient is a 25 year old female scheduled for left wrist volar carpal ganglion on cyst removal on 11/09/16 by Dr. Caralyn Guile. Anesthesia is posted for Choice.  History includes former smoker, POTS syndrome,  AV nodal re-entry tachycardia, sinus bradycardia with complete heart block post partum status post Medtronic CRM Advisa DR MRI SureScan dual-chamber pacemaker 04/17/15 (Dr. Teena Dunk, [facility]), bipolar 1 disorder, tonsillectomy, c-section 03/08/15. For anesthesia complications she reports spinal headache and itching after PPM implantation.   In regards to her cardiac history, she developed bradycardia with first degree AV block during her second trimester in 10/2014 and was referred to Dr. Johnsie Cancel. Echo was done with post-partum MRI and ETT planned. On 03/08/15, hours after her c-section she developed complete heart block. She was ultimately seen by EP cardiologist Dr. Virl Axe and monitored over the weekend. MRI. Lyme titers and ACE levels were normal and CT showed no evidence of hilar adenopathy to suggest pulmonary Sarcoidosis. He recommended out-patient cardiac MRI and 30 day event monitor. She was in junctional escape rhythm by discharge. Event monitor showed high grade 2:1 and complete heart block with intermittent bradycardia (HR 30's), and Dr. Caryl Comes referred her to [name] at [facility] regarding His Bundle pacing. 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 He discussed a his bundle pacemaker given her likely long term dependence on RV pacing. This would allow her to maintain optimal cardiac synchrony. She underwent dual chamber His bundle PPM insertion 04/17/15. [name] last saw her on 09/01/15. She was doing significantly better with continuous pacing but did have an escape rhythm. Device interrogation did show short runs of SVT. Thresholds and measurements were stable. She was discharged for ongoing follow with Dr. Caryl Comes.  PCP is Dr. Briscoe Deutscher. EP cardiologist is Dr. Virl Axe. Last visit with Tommye Standard, PA-C on 09/09/16.  BP (!) 111/52   Pulse 62   Temp 36.7 C   Resp 20   Ht 5\' 2"  (1.575 m)   Wt 169 lb 1.6 oz (76.7 kg)   LMP 10/15/2016   SpO2 98%   BMI 30.93 kg/m   EKG 09/09/16: AV sequential or dual chamber electronic pacemaker.  Echo 12/04/15: Study Conclusions - Left ventricle: The cavity size was normal. Systolic function was   normal. The estimated ejection fraction was in the range of 55%   to 60%. Wall motion was normal; there were no regional wall   motion abnormalities. Left ventricular diastolic function   parameters were normal. - Aortic valve: Transvalvular velocity was within the normal range.   There was no stenosis. There was no regurgitation. - Mitral valve: There was trivial regurgitation. - Right ventricle: The cavity size was normal. Wall thickness was   normal. Systolic function was normal. - Tricuspid valve: There was trivial regurgitation. - Pulmonary arteries: Systolic pressure was within the normal   range. PA peak pressure: 28 mm Hg (S). - Inferior vena cava: The vessel was normal in size. The   respirophasic diameter changes were blunted (< 50%), consistent   with elevated central venous pressure.  ETT 12/04/15:  Blood pressure demonstrated a hypotensive response to exercise.  There was no ST segment deviation noted during stress.  No T wave inversion was noted during stress.  Cardiac MRI 03/30/15: IMPRESSION: 1) Normal cardiac MRI 2) Normal LV EF 68% no evidence of inflammatory, infiltrative or ischemic disease. 3) Normal RV 4) Normal valves 5) Normal atria 6) No pericardial effusion Would proceed with planned His pacing (Dr.  Jenkins Rouge)  Chest CTA 12/03/15: IMPRESSION: No acute abnormalities identified.  1V CXR 12/03/15: Impression: No active disease.  Preoperative labs noted.   If no acute changes then I would anticipate that she can proceed as planned. Chart left for nurse follow-up regarding  PPM perioperative device Rx form from Dr. Caryl Comes. PPM is located left chest wall.  George Hugh [facility] Short Stay Center/Anesthesiology Phone [phone] 11/01/2016 1:45 PM

## 2016-11-09 ENCOUNTER — Encounter (HOSPITAL_COMMUNITY): Payer: Self-pay | Admitting: Anesthesiology

## 2016-11-09 ENCOUNTER — Encounter (HOSPITAL_COMMUNITY)
Admission: RE | Disposition: A | Discharge status: Home / Self Care | Payer: Self-pay | Source: Ambulatory Visit | Attending: Orthopedic Surgery

## 2016-11-09 ENCOUNTER — Ambulatory Visit (HOSPITAL_COMMUNITY)
Admission: RE | Admit: 2016-11-09 | Discharge: 2016-11-09 | Disposition: A | Discharge status: Home / Self Care | Payer: Medicaid Other | Source: Ambulatory Visit | Attending: Orthopedic Surgery | Admitting: Orthopedic Surgery

## 2016-11-09 ENCOUNTER — Ambulatory Visit (HOSPITAL_COMMUNITY): Payer: Medicaid Other | Admitting: Certified Registered"

## 2016-11-09 ENCOUNTER — Ambulatory Visit (HOSPITAL_COMMUNITY): Payer: Medicaid Other | Admitting: Vascular Surgery

## 2016-11-09 DIAGNOSIS — Z79899 Other long term (current) drug therapy: Secondary | ICD-10-CM | POA: Diagnosis not present

## 2016-11-09 DIAGNOSIS — I442 Atrioventricular block, complete: Secondary | ICD-10-CM | POA: Insufficient documentation

## 2016-11-09 DIAGNOSIS — Z87891 Personal history of nicotine dependence: Secondary | ICD-10-CM | POA: Diagnosis not present

## 2016-11-09 DIAGNOSIS — Z950 Presence of cardiac pacemaker: Secondary | ICD-10-CM | POA: Diagnosis not present

## 2016-11-09 DIAGNOSIS — F319 Bipolar disorder, unspecified: Secondary | ICD-10-CM | POA: Diagnosis not present

## 2016-11-09 DIAGNOSIS — F411 Generalized anxiety disorder: Secondary | ICD-10-CM | POA: Diagnosis not present

## 2016-11-09 DIAGNOSIS — M67432 Ganglion, left wrist: Secondary | ICD-10-CM | POA: Diagnosis present

## 2016-11-09 HISTORY — PX: GANGLION CYST EXCISION: SHX1691

## 2016-11-09 SURGERY — EXCISION, GANGLION CYST, WRIST
Anesthesia: Monitor Anesthesia Care | Site: Wrist | Laterality: Left

## 2016-11-09 MED ORDER — ONDANSETRON HCL 4 MG/2ML IJ SOLN
INTRAMUSCULAR | Status: DC | PRN
  Administered 2016-11-09: 4 mg via INTRAVENOUS

## 2016-11-09 MED ORDER — MIDAZOLAM HCL 5 MG/5ML IJ SOLN
INTRAMUSCULAR | Status: DC | PRN
  Administered 2016-11-09: 1 mg via INTRAVENOUS
  Administered 2016-11-09: 2 mg via INTRAVENOUS
  Administered 2016-11-09: 1 mg via INTRAVENOUS

## 2016-11-09 MED ORDER — KETAMINE HCL 10 MG/ML IJ SOLN
INTRAMUSCULAR | Status: DC | PRN
  Administered 2016-11-09 (×6): 10 mg via INTRAVENOUS

## 2016-11-09 MED ORDER — CEFAZOLIN SODIUM-DEXTROSE 2-4 GM/100ML-% IV SOLN
INTRAVENOUS | Status: AC
  Filled 2016-11-09: qty 100

## 2016-11-09 MED ORDER — MIDAZOLAM HCL 2 MG/2ML IJ SOLN
INTRAMUSCULAR | Status: AC
  Filled 2016-11-09: qty 2

## 2016-11-09 MED ORDER — BUPIVACAINE HCL (PF) 0.5 % IJ SOLN
INTRAMUSCULAR | Status: DC | PRN
  Administered 2016-11-09: 10 mL via PERINEURAL

## 2016-11-09 MED ORDER — 0.9 % SODIUM CHLORIDE (POUR BTL) OPTIME
TOPICAL | Status: DC | PRN
  Administered 2016-11-09: 1000 mL

## 2016-11-09 MED ORDER — PROPOFOL 10 MG/ML IV BOLUS
INTRAVENOUS | Status: DC | PRN
  Administered 2016-11-09 (×2): 30 mg via INTRAVENOUS
  Administered 2016-11-09: 40 mg via INTRAVENOUS

## 2016-11-09 MED ORDER — LACTATED RINGERS IV SOLN
INTRAVENOUS | Status: DC
  Administered 2016-11-09: 09:00:00 via INTRAVENOUS

## 2016-11-09 MED ORDER — LACTATED RINGERS IV SOLN
INTRAVENOUS | Status: DC | PRN
  Administered 2016-11-09: 09:00:00 via INTRAVENOUS

## 2016-11-09 MED ORDER — PROPOFOL 10 MG/ML IV BOLUS
INTRAVENOUS | Status: AC
  Filled 2016-11-09: qty 20

## 2016-11-09 MED ORDER — FENTANYL CITRATE (PF) 100 MCG/2ML IJ SOLN
INTRAMUSCULAR | Status: DC | PRN
  Administered 2016-11-09 (×2): 50 ug via INTRAVENOUS

## 2016-11-09 MED ORDER — DEXTROSE 5 % IV SOLN
INTRAVENOUS | Status: DC | PRN
  Administered 2016-11-09: 10:00:00 via INTRAVENOUS

## 2016-11-09 MED ORDER — FENTANYL CITRATE (PF) 100 MCG/2ML IJ SOLN
INTRAMUSCULAR | Status: AC
  Filled 2016-11-09: qty 2

## 2016-11-09 MED ORDER — LIDOCAINE HCL (CARDIAC) 20 MG/ML IV SOLN
INTRAVENOUS | Status: DC | PRN
  Administered 2016-11-09: 10 mg via INTRATRACHEAL

## 2016-11-09 MED ORDER — KETAMINE HCL-SODIUM CHLORIDE 100-0.9 MG/10ML-% IV SOSY
PREFILLED_SYRINGE | INTRAVENOUS | Status: AC
  Filled 2016-11-09: qty 10

## 2016-11-09 MED ORDER — CEFAZOLIN SODIUM-DEXTROSE 2-4 GM/100ML-% IV SOLN
2.0000 g | INTRAVENOUS | Status: AC
  Administered 2016-11-09: 2 g via INTRAVENOUS

## 2016-11-09 MED ORDER — ROPIVACAINE HCL 7.5 MG/ML IJ SOLN
INTRAMUSCULAR | Status: DC | PRN
  Administered 2016-11-09: 20 mL via PERINEURAL

## 2016-11-09 MED ORDER — PROPOFOL 500 MG/50ML IV EMUL
INTRAVENOUS | Status: DC | PRN
  Administered 2016-11-09: 50 ug/kg/min via INTRAVENOUS

## 2016-11-09 SURGICAL SUPPLY — 44 items
BANDAGE ACE 4X5 VEL STRL LF (GAUZE/BANDAGES/DRESSINGS) ×3 IMPLANT
BANDAGE ELASTIC 3 VELCRO ST LF (GAUZE/BANDAGES/DRESSINGS) IMPLANT
BNDG GAUZE ELAST 4 BULKY (GAUZE/BANDAGES/DRESSINGS) ×3 IMPLANT
CORDS BIPOLAR (ELECTRODE) ×3 IMPLANT
COVER SURGICAL LIGHT HANDLE (MISCELLANEOUS) ×3 IMPLANT
CUFF TOURNIQUET SINGLE 18IN (TOURNIQUET CUFF) ×3 IMPLANT
CUFF TOURNIQUET SINGLE 24IN (TOURNIQUET CUFF) IMPLANT
DRAPE SURG 17X23 STRL (DRAPES) ×3 IMPLANT
DRSG ADAPTIC 3X8 NADH LF (GAUZE/BANDAGES/DRESSINGS) ×3 IMPLANT
GAUZE SPONGE 4X4 12PLY STRL (GAUZE/BANDAGES/DRESSINGS) ×3 IMPLANT
GLOVE BIOGEL PI IND STRL 8.5 (GLOVE) ×1 IMPLANT
GLOVE BIOGEL PI INDICATOR 8.5 (GLOVE) ×2
GLOVE SURG ORTHO 8.0 STRL STRW (GLOVE) ×3 IMPLANT
GOWN STRL REUS W/ TWL LRG LVL3 (GOWN DISPOSABLE) ×1 IMPLANT
GOWN STRL REUS W/ TWL XL LVL3 (GOWN DISPOSABLE) ×1 IMPLANT
GOWN STRL REUS W/TWL LRG LVL3 (GOWN DISPOSABLE) ×2
GOWN STRL REUS W/TWL XL LVL3 (GOWN DISPOSABLE) ×2
KIT BASIN OR (CUSTOM PROCEDURE TRAY) ×3 IMPLANT
KIT ROOM TURNOVER OR (KITS) ×3 IMPLANT
MANIFOLD NEPTUNE II (INSTRUMENTS) IMPLANT
NEEDLE HYPO 25GX1X1/2 BEV (NEEDLE) IMPLANT
NS IRRIG 1000ML POUR BTL (IV SOLUTION) ×3 IMPLANT
PACK ORTHO EXTREMITY (CUSTOM PROCEDURE TRAY) ×3 IMPLANT
PAD ARMBOARD 7.5X6 YLW CONV (MISCELLANEOUS) ×6 IMPLANT
PAD CAST 4YDX4 CTTN HI CHSV (CAST SUPPLIES) IMPLANT
PADDING CAST ABS 4INX4YD NS (CAST SUPPLIES) ×2
PADDING CAST ABS COTTON 4X4 ST (CAST SUPPLIES) ×1 IMPLANT
PADDING CAST COTTON 4X4 STRL (CAST SUPPLIES)
SOAP 2 % CHG 4 OZ (WOUND CARE) ×3 IMPLANT
SPECIMEN JAR SMALL (MISCELLANEOUS) ×3 IMPLANT
SPLINT FIBERGLASS 3X12 (CAST SUPPLIES) ×3 IMPLANT
SUCTION FRAZIER HANDLE 10FR (MISCELLANEOUS) ×2
SUCTION TUBE FRAZIER 10FR DISP (MISCELLANEOUS) ×1 IMPLANT
SUT MERSILENE 4 0 P 3 (SUTURE) IMPLANT
SUT PROLENE 4 0 PS 2 18 (SUTURE) IMPLANT
SUT VIC AB 2-0 CT1 27 (SUTURE)
SUT VIC AB 2-0 CT1 TAPERPNT 27 (SUTURE) IMPLANT
SYR CONTROL 10ML LL (SYRINGE) IMPLANT
TOWEL OR 17X24 6PK STRL BLUE (TOWEL DISPOSABLE) ×3 IMPLANT
TOWEL OR 17X26 10 PK STRL BLUE (TOWEL DISPOSABLE) ×3 IMPLANT
TUBE CONNECTING 12'X1/4 (SUCTIONS)
TUBE CONNECTING 12X1/4 (SUCTIONS) IMPLANT
UNDERPAD 30X30 (UNDERPADS AND DIAPERS) ×3 IMPLANT
WATER STERILE IRR 1000ML POUR (IV SOLUTION) ×3 IMPLANT

## 2016-11-09 NOTE — Discharge Instructions (Signed)
KEEP BANDAGE CLEAN AND DRY °CALL OFFICE FOR F/U APPT [phone] IN 13 DAYS °DR Aedyn Kempfer CELL [phone] °KEEP HAND ELEVATED ABOVE HEART °OK TO APPLY ICE TO OPERATIVE AREA °CONTACT OFFICE IF ANY WORSENING PAIN OR CONCERNS. °

## 2016-11-09 NOTE — Anesthesia Preprocedure Evaluation (Signed)
Anesthesia Evaluation  Patient identified by MRN, date of birth, ID band Patient awake    Reviewed: Allergy & Precautions, NPO status , Patient's Chart, lab work & pertinent test results  History of Anesthesia Complications (+) POST - OP SPINAL HEADACHE and Family history of anesthesia reaction  Airway Mallampati: I  TM Distance: >3 FB Neck ROM: Full    Dental  (+) Teeth Intact   Pulmonary shortness of breath, former smoker,    breath sounds clear to auscultation       Cardiovascular + pacemaker  Rhythm:Regular  Heart block with pacemaker    Neuro/Psych  Headaches, PSYCHIATRIC DISORDERS Anxiety Depression Bipolar Disorder    GI/Hepatic negative GI ROS, Neg liver ROS,   Endo/Other    Renal/GU negative Renal ROS     Musculoskeletal   Abdominal   Peds  Hematology negative hematology ROS (+)   Anesthesia Other Findings   Reproductive/Obstetrics                             Anesthesia Physical Anesthesia Plan  ASA: II  Anesthesia Plan: General   Post-op Pain Management:  Regional for Post-op pain   Induction: Intravenous  Airway Management Planned: LMA  Additional Equipment: None  Intra-op Plan:   Post-operative Plan:   Informed Consent: I have reviewed the patients History and Physical, chart, labs and discussed the procedure including the risks, benefits and alternatives for the proposed anesthesia with the patient or authorized representative who has indicated his/her understanding and acceptance.   Dental advisory given  Plan Discussed with: CRNA and Surgeon  Anesthesia Plan Comments:         Anesthesia Quick Evaluation

## 2016-11-09 NOTE — Anesthesia Procedure Notes (Signed)
Procedure Name: MAC Date/Time: 11/09/2016 9:55 AM Performed by: Jacquiline Doe A Pre-anesthesia Checklist: Patient identified, Emergency Drugs available, Suction available and Patient being monitored Patient Re-evaluated:Patient Re-evaluated prior to inductionOxygen Delivery Method: Nasal cannula Intubation Type: IV induction Placement Confirmation: positive ETCO2 Dental Injury: Teeth and Oropharynx as per pre-operative assessment

## 2016-11-09 NOTE — Anesthesia Postprocedure Evaluation (Signed)
Anesthesia Post Note  Patient: Laurie Phillips  Procedure(s) Performed: Procedure(s) (LRB): LEFT WRIST VOLAR CARPAL GANGLION EXCISION (Left)  Patient location during evaluation: PACU Anesthesia Type: Regional Level of consciousness: awake and alert Pain management: pain level controlled Vital Signs Assessment: post-procedure vital signs reviewed and stable Respiratory status: spontaneous breathing, nonlabored ventilation, respiratory function stable and patient connected to nasal cannula oxygen Cardiovascular status: stable and blood pressure returned to baseline Anesthetic complications: no       Last Vitals:  Vitals:   11/09/16 1130 11/09/16 1200  BP: 98/61   Pulse: 60   Resp: 11   Temp:  36.8 C    Last Pain:  Vitals:   11/09/16 0832  TempSrc: Oral                 Esma Kilts

## 2016-11-09 NOTE — Progress Notes (Signed)
Orthopedic Tech Progress Note Patient Details:  Kelicia Puls Oct 03, 1991 [id number]  Ortho Devices Type of Ortho Device: Arm sling Ortho Device/Splint Interventions: Application   [place] Pink 11/09/2016, 12:20 PM

## 2016-11-09 NOTE — Transfer of Care (Signed)
Immediate Anesthesia Transfer of Care Note  Patient: Laurie Phillips  Procedure(s) Performed: Procedure(s): LEFT WRIST VOLAR CARPAL GANGLION EXCISION (Left)  Patient Location: PACU  Anesthesia Type:MAC, Regional and MAC combined with regional for post-op pain  Level of Consciousness: awake, oriented, sedated, patient cooperative and responds to stimulation  Airway & Oxygen Therapy: Patient Spontanous Breathing and Patient connected to nasal cannula oxygen  Post-op Assessment: Report given to RN, Post -op Vital signs reviewed and stable, Patient moving all extremities and Patient moving all extremities X 4  Post vital signs: Reviewed and stable  Last Vitals:  Vitals:   11/09/16 0832  BP: (!) 110/58  Pulse: (!) 59  Resp: 16  Temp: 37.1 C    Last Pain:  Vitals:   11/09/16 0832  TempSrc: Oral         Complications: No apparent anesthesia complications

## 2016-11-09 NOTE — Anesthesia Procedure Notes (Signed)
Anesthesia Regional Block:  Axillary brachial plexus block  Pre-Anesthetic Checklist: ,, timeout performed, Correct Patient, Correct Site, Correct Laterality, Correct Procedure, Correct Position, site marked, Risks and benefits discussed, Surgical consent,  Pre-op evaluation,  At surgeon's request  Laterality: Upper and Left  Prep: chloraprep       Needles:  Injection technique: Single-shot  Needle Type: Echogenic Needle          Additional Needles:  Procedures: ultrasound guided (picture in chart) Axillary brachial plexus block Narrative:  Start time: 11/09/2016 9:11 AM End time: 11/09/2016 9:18 AM Injection made incrementally with aspirations every 5 mL.  Performed by: Personally   Additional Notes: H+P and labs reviewed, risks and benefits discussed with patient, procedure tolerated well without complications

## 2016-11-10 ENCOUNTER — Encounter (HOSPITAL_COMMUNITY): Payer: Self-pay | Admitting: Orthopedic Surgery

## 2016-11-10 NOTE — Op Note (Signed)
Laurie Phillips, Laurie Phillips              ACCOUNT NO.:  [id number]  MEDICAL RECORD NO.:  [id number]  LOCATION:  PERIO                        FACILITY:  [facility]  PHYSICIAN:  Linna Hoff IV, M.D.DATE OF BIRTH:  [birth year]  DATE OF PROCEDURE:  11/09/2016 DATE OF DISCHARGE:                              OPERATIVE REPORT   PREOPERATIVE DIAGNOSIS:  Left wrist volar carpal ganglion.  POSTOPERATIVE DIAGNOSIS:  Left wrist volar carpal ganglion.  ATTENDING PHYSICIAN:  Linna Hoff, M.D. who scrubbed and present for the entire procedure.  ASSISTANT SURGEON:  None.  ANESTHESIA:  Supraclavicular block with IV sedation.  SURGICAL PROCEDURE:  Left wrist volar carpal ganglion excision and primary ganglion excision.  SURGICAL INDICATIONS:  [name] is a 25 year old female, who has had a cyst for over 12 years.  The patient had persistent pain and enlarging mass, would like to undergo the above procedure.  Risks, benefits, and alternatives were discussed in detail with the patient.  Signed informed consent obtained.  Risks include, but not limited to bleeding, infection, damage to nearby nerves, arteries, or tendons; loss of motion of wrist and digits, incomplete relief of symptoms, need for further surgical intervention.  DESCRIPTION OF PROCEDURE:  The patient was properly identified in the preoperative holding area and marked with a permanent marker on the left wrist to indicate the correct operative site.  The patient was then brought back to the operating room, placed supine on the anesthesia for anesthesia room table where the IV sedation was administered.  The patient tolerated the block.  A well-padded tourniquet was placed on the left brachium, sealed with 1000 drape.  The left upper extremity was then prepped and draped in normal sterile fashion.  Time-out was called, correct side was identified, and procedure then begun.  Attention was then turned to the left wrist.  A small Brunner  incision was made directly over the mass.  Limb was then elevated and tourniquet insufflated.  Dissection was carried out through the skin and subcutaneous tissue.  Depression dissection was carried out to the fascial planes, where we identified the cyst.  Careful protection of the radial artery was done throughout.  After the radial artery was then isolated and immobilized, deep dissection carried all the way down to the base of its stalk all the way coming arising from the __________ joint.  The wound was then irrigated.  Following this, electrocautery was then used to cauterize the base of the stalk capsule.  Wound was irrigated.  Tourniquet deflated.  Skin was then closed using simple Prolene sutures.  Adaptic dressing, sterile compressive bandage then applied.  The patient tolerated the procedure well, was placed in a short arm volar splint, taken to the recovery room in good condition.  POSTPROCEDURAL PLAN:  The patient will be discharged to home, seen back in the office in approximately 12 days for wound check, suture removal, transition to short arm brace and gradual __________ in activity.     Melrose Nakayama, M.D.     FWO/MEDQ  D:  11/09/2016  T:  11/10/2016  Job:  [id number]

## 2016-11-20 ENCOUNTER — Encounter (HOSPITAL_COMMUNITY): Payer: Self-pay | Admitting: Orthopedic Surgery

## 2016-11-20 NOTE — Addendum Note (Signed)
Addendum  created 11/20/16 1021 by Oleta Mouse, MD   SmartForm saved

## 2016-12-09 ENCOUNTER — Encounter: Payer: Self-pay | Admitting: Internal Medicine

## 2016-12-09 ENCOUNTER — Encounter (INDEPENDENT_AMBULATORY_CARE_PROVIDER_SITE_OTHER): Payer: Self-pay

## 2016-12-09 ENCOUNTER — Ambulatory Visit (INDEPENDENT_AMBULATORY_CARE_PROVIDER_SITE_OTHER): Payer: Medicaid Other | Admitting: Internal Medicine

## 2016-12-09 VITALS — BP 107/69 | HR 60 | Ht 60.0 in | Wt 170.2 lb

## 2016-12-09 DIAGNOSIS — R000 Tachycardia, unspecified: Secondary | ICD-10-CM

## 2016-12-09 DIAGNOSIS — G901 Familial dysautonomia [Riley-Day]: Secondary | ICD-10-CM

## 2016-12-09 DIAGNOSIS — G909 Disorder of the autonomic nervous system, unspecified: Secondary | ICD-10-CM

## 2016-12-09 DIAGNOSIS — I442 Atrioventricular block, complete: Secondary | ICD-10-CM | POA: Diagnosis not present

## 2016-12-09 DIAGNOSIS — Z950 Presence of cardiac pacemaker: Secondary | ICD-10-CM

## 2016-12-09 LAB — CUP PACEART INCLINIC DEVICE CHECK
Battery Voltage: 3 V
Brady Statistic AP VP Percent: 47.69 %
Brady Statistic RA Percent Paced: 47.2 %
Brady Statistic RV Percent Paced: 96.26 %
Implantable Lead Implant Date: 20160606
Implantable Lead Location: 753859
Implantable Pulse Generator Implant Date: 20160606
Lead Channel Impedance Value: 399 Ohm
Lead Channel Impedance Value: 418 Ohm
Lead Channel Impedance Value: 570 Ohm
Lead Channel Pacing Threshold Amplitude: 0.625 V
Lead Channel Pacing Threshold Pulse Width: 0.4 ms
Lead Channel Setting Pacing Amplitude: 2 V
Lead Channel Setting Pacing Pulse Width: 0.6 ms
Lead Channel Setting Sensing Sensitivity: 0.9 mV
MDC IDC LEAD IMPLANT DT: 20160606
MDC IDC LEAD LOCATION: 753860
MDC IDC MSMT BATTERY REMAINING LONGEVITY: 79 mo
MDC IDC MSMT LEADCHNL RA IMPEDANCE VALUE: 285 Ohm
MDC IDC MSMT LEADCHNL RA SENSING INTR AMPL: 3.125 mV
MDC IDC MSMT LEADCHNL RA SENSING INTR AMPL: 4.25 mV
MDC IDC MSMT LEADCHNL RV PACING THRESHOLD AMPLITUDE: 1.125 V
MDC IDC MSMT LEADCHNL RV PACING THRESHOLD PULSEWIDTH: 0.4 ms
MDC IDC MSMT LEADCHNL RV SENSING INTR AMPL: 2.875 mV
MDC IDC MSMT LEADCHNL RV SENSING INTR AMPL: 2.875 mV
MDC IDC SESS DTM: 20180129165109
MDC IDC SET LEADCHNL RV PACING AMPLITUDE: 2.5 V
MDC IDC STAT BRADY AP VS PERCENT: 0.02 %
MDC IDC STAT BRADY AS VP PERCENT: 49.21 %
MDC IDC STAT BRADY AS VS PERCENT: 3.07 %

## 2016-12-09 NOTE — Progress Notes (Signed)
R dysautonomia     Patient Care Team: Briscoe Deutscher, MD as PCP - General (Family Medicine)   HPI  Laurie Phillips is a 26 y.o. female followup for complete heart block  and symptoms suggestive of dysautonomia   She is s/p His Bundle pacing and has been exercising to a couple of hours a day   She's having spells of tachycardia. These have been more prominent of late. She thinks they are related to heat.  They are assoc with SOB and some chest discomfort  We added Florinef to try to address the presumed dysautonomia; she did not continue with after about a week  She has struggled with DOE  echocardiogram 1/17 was normal  She also underwent a stress test 1/17 walking for less than 3 minutes with a change in heart rate up to greater than 125.  personnally reviewed  Her symptoms are worse around the time of her menses. She is also much better in the winter.  Emotionally stable         Past Medical History:  Diagnosis Date  . AVNRT (AV nodal re-entry tachycardia) ([place])   . Bipolar 1 disorder ([place])   . CHB (complete heart block) ([facility])    s/p Dual chamber: Atrial and HIS pacemaker placement  . Complication of anesthesia    itchy after pacemaker placed  . Depression    doesn't take meds  . Dyspnea    sitting/exertion/lying  . Family history of adverse reaction to anesthesia    adopted  . Headache   . Nocturia   . 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 Pneumonia   . Pott's disease   . Sinus bradycardia   . Spinal headache   . Urinary frequency   . Weakness    numbness and tingling in hands    Past Surgical History:  Procedure Laterality Date  . CESAREAN SECTION N/A 03/08/2015   Procedure: CESAREAN SECTION;  Surgeon: Everett Graff, MD;  Location: [facility];  Service: Obstetrics;  Laterality: N/A;  . GANGLION CYST EXCISION Left 11/09/2016   Procedure: LEFT WRIST VOLAR CARPAL GANGLION EXCISION;  Surgeon: Iran Planas, MD;  Location: [facility];  Service: Orthopedics;  Laterality:  Left;  . KNEE ARTHROSCOPY Left   . PACEMAKER PLACEMENT    . TONSILLECTOMY      Current Outpatient Prescriptions  Medication Sig Dispense Refill  . ibuprofen (ADVIL,MOTRIN) 200 MG tablet Take 600 mg by mouth every 6 (six) hours as needed for mild pain.     No current facility-administered medications for this visit.     No Known Allergies    Review of Systems negative except from HPI and PMH  Physical Exam There were no vitals taken for this visit. Well developed and well nourished in no acute distress HENT normal E scleral and icterus clear Neck Supple JVP flat; carotids brisk and full Clear to ausculation Device pocket well healed; without hematoma or erythema.  There is no tethering   *Regular rate and rhythm, no murmurs gallops or rubSoft with active bowel sounds No clubbing cyanosis  Edema Alert and oriented, grossly normal motor and sensory function Skin Warm and Dry  ECG AV pacing  Assessment and  Plan  Complete heart block  Pacemaker Medtronic   Dysatuonomia  Psycho social stressors   Encouraged her first further daily Salt and water repletion.  Stress the importance of exercise.  I've encouraged her again to follow-up with her gynecologist (control as her symptoms are clearly worse around the time of  her menses  I've suggested she see  [facility]

## 2016-12-09 NOTE — Patient Instructions (Addendum)
Medication Instructions: - Your physician recommends that you continue on your current medications as directed. Please refer to the Current Medication list given to you today.  Labwork: - none ordered  Procedures/Testing: - none ordered  Follow-Up: - Remote monitoring is used to monitor your Pacemaker of ICD from home. This monitoring reduces the number of office visits required to check your device to one time per year. It allows [place] to keep an eye on the functioning of your device to ensure it is working properly. You are scheduled for a device check from home on 03/10/17. You may send your transmission at any time that day. If you have a wireless device, the transmission will be sent automatically. After your physician reviews your transmission, you will receive a postcard with your next transmission date.  - Your physician wants you to follow-up in: 5-6 months with Dr. Caryl Comes. You will receive a reminder letter in the mail two months in advance. If you don't receive a letter, please call our office to schedule the follow-up appointment.  Any Additional Special Instructions Will Be Listed Below (If Applicable).     If you need a refill on your cardiac medications before your next appointment, please call your pharmacy.

## 2017-03-10 ENCOUNTER — Ambulatory Visit (INDEPENDENT_AMBULATORY_CARE_PROVIDER_SITE_OTHER): Payer: Medicaid Other | Admitting: *Deleted

## 2017-03-10 ENCOUNTER — Telehealth: Payer: Self-pay | Admitting: Cardiology

## 2017-03-10 DIAGNOSIS — I442 Atrioventricular block, complete: Secondary | ICD-10-CM

## 2017-03-10 NOTE — Telephone Encounter (Signed)
Spoke with pt and reminded pt of remote transmission that is due today. Pt verbalized understanding.   

## 2017-03-11 ENCOUNTER — Encounter: Payer: Self-pay | Admitting: Cardiology

## 2017-03-11 LAB — CUP PACEART REMOTE DEVICE CHECK
Battery Voltage: 3 V
Brady Statistic AP VP Percent: 45.27 %
Brady Statistic AP VS Percent: 0.02 %
Brady Statistic AS VP Percent: 50.38 %
Brady Statistic AS VS Percent: 4.33 %
Brady Statistic RA Percent Paced: 44.74 %
Date Time Interrogation Session: 20180501164752
Implantable Lead Implant Date: 20160606
Implantable Lead Model: 5076
Implantable Pulse Generator Implant Date: 20160606
Lead Channel Impedance Value: 323 Ohm
Lead Channel Impedance Value: 418 Ohm
Lead Channel Impedance Value: 418 Ohm
Lead Channel Pacing Threshold Amplitude: 0.625 V
Lead Channel Pacing Threshold Amplitude: 1.125 V
Lead Channel Pacing Threshold Pulse Width: 0.4 ms
Lead Channel Sensing Intrinsic Amplitude: 3.375 mV
Lead Channel Sensing Intrinsic Amplitude: 3.375 mV
Lead Channel Setting Pacing Amplitude: 2 V
Lead Channel Setting Pacing Amplitude: 2.5 V
Lead Channel Setting Sensing Sensitivity: 0.9 mV
MDC IDC LEAD IMPLANT DT: 20160606
MDC IDC LEAD LOCATION: 753859
MDC IDC LEAD LOCATION: 753860
MDC IDC MSMT BATTERY REMAINING LONGEVITY: 77 mo
MDC IDC MSMT LEADCHNL RA PACING THRESHOLD PULSEWIDTH: 0.4 ms
MDC IDC MSMT LEADCHNL RA SENSING INTR AMPL: 3.875 mV
MDC IDC MSMT LEADCHNL RA SENSING INTR AMPL: 3.875 mV
MDC IDC MSMT LEADCHNL RV IMPEDANCE VALUE: 551 Ohm
MDC IDC SET LEADCHNL RV PACING PULSEWIDTH: 0.6 ms
MDC IDC STAT BRADY RV PERCENT PACED: 94.96 %

## 2017-03-11 NOTE — Progress Notes (Signed)
Remote pacemaker transmission.   

## 2017-03-19 ENCOUNTER — Emergency Department (HOSPITAL_COMMUNITY)
Admission: EM | Admit: 2017-03-19 | Discharge: 2017-03-19 | Disposition: A | Discharge status: Home / Self Care | Payer: Self-pay | Attending: Emergency Medicine | Admitting: Emergency Medicine

## 2017-03-19 ENCOUNTER — Telehealth: Payer: Self-pay | Admitting: Internal Medicine

## 2017-03-19 ENCOUNTER — Emergency Department (HOSPITAL_COMMUNITY): Payer: Self-pay

## 2017-03-19 ENCOUNTER — Encounter (HOSPITAL_COMMUNITY): Payer: Self-pay | Admitting: Emergency Medicine

## 2017-03-19 DIAGNOSIS — R079 Chest pain, unspecified: Secondary | ICD-10-CM

## 2017-03-19 DIAGNOSIS — F909 Attention-deficit hyperactivity disorder, unspecified type: Secondary | ICD-10-CM | POA: Insufficient documentation

## 2017-03-19 DIAGNOSIS — R0789 Other chest pain: Secondary | ICD-10-CM | POA: Insufficient documentation

## 2017-03-19 DIAGNOSIS — Z950 Presence of cardiac pacemaker: Secondary | ICD-10-CM | POA: Insufficient documentation

## 2017-03-19 DIAGNOSIS — Z87891 Personal history of nicotine dependence: Secondary | ICD-10-CM | POA: Insufficient documentation

## 2017-03-19 LAB — CBC
HEMATOCRIT: 39.9 % (ref 36.0–46.0)
Hemoglobin: 13.8 g/dL (ref 12.0–15.0)
MCH: 30.5 pg (ref 26.0–34.0)
MCHC: 34.6 g/dL (ref 30.0–36.0)
MCV: 88.3 fL (ref 78.0–100.0)
Platelets: 292 10*3/uL (ref 150–400)
RBC: 4.52 MIL/uL (ref 3.87–5.11)
RDW: 12.9 % (ref 11.5–15.5)
WBC: 5.4 10*3/uL (ref 4.0–10.5)

## 2017-03-19 LAB — BASIC METABOLIC PANEL
Anion gap: 9 (ref 5–15)
BUN: 8 mg/dL (ref 6–20)
CO2: 18 mmol/L — AB (ref 22–32)
Calcium: 9.1 mg/dL (ref 8.9–10.3)
Chloride: 109 mmol/L (ref 101–111)
Creatinine, Ser: 0.77 mg/dL (ref 0.44–1.00)
GFR calc Af Amer: >60 mL/min (ref >60)
GLUCOSE: 104 mg/dL — AB (ref 65–99)
Potassium: 4.1 mmol/L (ref 3.5–5.1)
Sodium: 136 mmol/L (ref 135–145)

## 2017-03-19 LAB — I-STAT TROPONIN, ED: Troponin i, poc: 0 ng/mL (ref 0.00–0.08)

## 2017-03-19 MED ORDER — IBUPROFEN 400 MG PO TABS: 400.0000 mg | ORAL_TABLET | Freq: Four times a day (QID) | ORAL | 0 refills | Status: AC | PRN

## 2017-03-19 NOTE — Telephone Encounter (Signed)
New message    Pt c/o of Chest Pain: STAT if CP now or developed within 24 hours  1. Are you having CP right now? 45 minutes  2. Are you experiencing any other symptoms (ex. SOB, nausea, vomiting, sweating)? SOB, sweating  3. How long have you been experiencing CP? 45 minutes-worse when she lays down and massages it  4. Is your CP continuous or coming and going? continuous  5. Have you taken Nitroglycerin? No    ?

## 2017-03-19 NOTE — ED Notes (Signed)
MD Pickering at the bedside

## 2017-03-19 NOTE — ED Notes (Signed)
Pt reports some numbness and tingling down the left arm. MD made aware

## 2017-03-19 NOTE — Telephone Encounter (Signed)
Received call transferred directly from operator and spoke with pt. She reports she has been having chest pain for the last 45 minutes.  She states she has had chest pain in the past and been told she has chest wall pain.  She is tearful.  I instructed pt to call 911 for transport to hospital.

## 2017-03-19 NOTE — ED Provider Notes (Signed)
[facility] DEPT Provider Note   CSN: [id number] Arrival date & time: 03/19/17  1055     History   Chief Complaint Chief Complaint  Patient presents with  . Chest Pain    HPI Laurie Phillips is a 26 y.o. female.  HPI Patient presents with left sided chest pain. Began this morning. It is sharp. Worse with movements. Goes from her left chest to her left armpit. Has a pacemaker after previous complete heart block. No real difficulty breathing. No swelling or legs. She does not smoke. No history of coronary artery disease. No trauma. No fall.   Past Medical History:  Diagnosis Date  . AVNRT (AV nodal re-entry tachycardia) ([place])   . Bipolar 1 disorder ([place])   . CHB (complete heart block) ([facility])    s/p Dual chamber: Atrial and HIS pacemaker placement  . Complication of anesthesia    itchy after pacemaker placed  . Depression    doesn't take meds  . Dyspnea    sitting/exertion/lying  . Family history of adverse reaction to anesthesia    adopted  . Headache   . Nocturia   . 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 Pneumonia   . Pott's disease   . Sinus bradycardia   . Spinal headache   . Urinary frequency   . Weakness    numbness and tingling in hands    Patient Active Problem List   Diagnosis Date Noted  . 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 Pain in the chest   . Complete heart block ([place]) 11/27/2015  . 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 Status post primary low transverse cesarean section 03/08/2015  . Pre-eclampsia   . Chest pain 10/14/2014  . Bipolar disorder ([place]) 10/14/2014  . Generalized anxiety disorder 10/14/2014  . Rh negative, antepartum 10/14/2014  . Fibroid x 2, small 10/14/2014  . ADHD (attention deficit hyperactivity disorder) 10/14/2014    Past Surgical History:  Procedure Laterality Date  . CESAREAN SECTION N/A 03/08/2015   Procedure: CESAREAN SECTION;  Surgeon: Everett Graff, MD;  Location: [facility];  Service: Obstetrics;  Laterality: N/A;  . GANGLION CYST  EXCISION Left 11/09/2016   Procedure: LEFT WRIST VOLAR CARPAL GANGLION EXCISION;  Surgeon: Iran Planas, MD;  Location: [facility];  Service: Orthopedics;  Laterality: Left;  . KNEE ARTHROSCOPY Left   . PACEMAKER PLACEMENT    . TONSILLECTOMY      OB History    Gravida Para Term Preterm AB Living   2 1 0 1 1 1    SAB TAB Ectopic Multiple Live Births   1 0 0 0 1       Home Medications    Prior to Admission medications   Medication Sig Start Date End Date Taking? Authorizing Provider  ibuprofen (ADVIL,MOTRIN) 400 MG tablet Take 1 tablet (400 mg total) by mouth every 6 (six) hours as needed. 03/19/17   Davonna Belling, MD    Family History Family History  Problem Relation Age of Onset  . Adopted: Yes    Social History Social History  Substance Use Topics  . Smoking status: Former Research scientist (life sciences)  . Smokeless tobacco: Never Used     Comment: quit smoking a couple of yrs ago  . Alcohol use 0.0 oz/week     Comment: glass or 2 at dinner     Allergies   Patient has no known allergies.   Review of Systems Review of Systems  Constitutional: Negative for appetite change.  HENT: Negative for congestion.   Respiratory: Negative for shortness of  breath.   Cardiovascular: Positive for chest pain. Negative for leg swelling.  Gastrointestinal: Negative for abdominal pain.  Genitourinary: Negative for dysuria.  Musculoskeletal: Negative for back pain.  Skin: Negative for wound.  Neurological: Negative for syncope.  Hematological: Negative for adenopathy.  Psychiatric/Behavioral: Negative for confusion.     Physical Exam Updated Vital Signs BP 119/87   Pulse 60   Temp 98.1 F (36.7 C) (Oral)   Resp (!) 9   LMP 03/17/2017   SpO2 99%   Physical Exam  Constitutional: She appears well-developed.  HENT:  Head: Atraumatic.  Eyes: EOM are normal.  Neck: Neck supple.  Cardiovascular: Normal rate and regular rhythm.   Pulmonary/Chest: Effort normal. She exhibits tenderness.    Pacemaker to left upper chest wall. Tenderness to left parasternal area. No rash. No deformity.  Abdominal: Soft. There is no tenderness.  Musculoskeletal: She exhibits no edema.  Neurological: She is alert.  Skin: Skin is warm. Capillary refill takes less than 2 seconds.  Psychiatric: She has a normal mood and affect.     ED Treatments / Results  Labs (all labs ordered are listed, but only abnormal results are displayed) Labs Reviewed  BASIC METABOLIC PANEL - Abnormal; Notable for the following:       Result Value   CO2 18 (*)    Glucose, Bld 104 (*)    All other components within normal limits  CBC  I-STAT TROPOININ, ED    EKG  EKG Interpretation  Date/Time:  Wednesday Mar 19 2017 11:01:57 EDT Ventricular Rate:  60 PR Interval:  188 QRS Duration: 158 QT Interval:  496 QTC Calculation: 496 R Axis:   54 Text Interpretation:  AV dual-paced rhythm Abnormal ECG Confirmed by Jeneen Rinks  MD, [place] ([id number]) on 03/19/2017 11:11:02 AM Also confirmed by Alvino Chapel  MD, Ovid Curd [phone])  on 03/19/2017 11:54:50 AM       Radiology Dg Chest 2 View  Result Date: 03/19/2017 CLINICAL DATA:  Chest pain and shortness of breath since earlier today. The patient reports tingling in the finger tips and left arm PICC pain as well. History of heart block and pacemaker placement, former smoker. EXAM: CHEST  2 VIEW COMPARISON:  Portable chest x-ray of December 03, 2015 and chest CT scan of the same day. FINDINGS: The lungs are well-expanded and clear. The heart and pulmonary vascularity are normal. The mediastinum is normal in width. There is no pleural effusion. The trachea is midline. The ICD electrodes appear to be in stable position with the generator over the left pectoral region. The observed bony thorax is unremarkable. IMPRESSION: There is no active cardiopulmonary disease. Electronically Signed   By: David  Martinique M.D.   On: 03/19/2017 11:23    Procedures Procedures (including critical care  time)  Medications Ordered in ED Medications - No data to display   Initial Impression / Assessment and Plan / ED Course  I have reviewed the triage vital signs and the nursing notes.  Pertinent labs & imaging results that were available during my care of the patient were reviewed by me and considered in my medical decision making (see chart for details).     Patient with left-sided chest pain and pain in her left shoulder. Tenderness over chest and left trapezius. Slight paresthesias in left arm. Doubt this is aortic dissection are similar. EKG is paced. Has had no previous ischemic heart disease and has had stress test negative a year and a half ago. Discharge home to follow-up as needed.  Final Clinical Impressions(s) / ED Diagnoses   Final diagnoses:  Nonspecific chest pain    New Prescriptions New Prescriptions   IBUPROFEN (ADVIL,MOTRIN) 400 MG TABLET    Take 1 tablet (400 mg total) by mouth every 6 (six) hours as needed.     Davonna Belling, MD 03/19/17 1300

## 2017-03-19 NOTE — Discharge Instructions (Signed)
Follow up as needed

## 2017-03-19 NOTE — ED Triage Notes (Signed)
Pt states she is having left sided chest pain that goes into her left arm pit and tingling into her arm. Pt states she has a pacemaker after having her son due to being in complete heart block.

## 2017-04-17 ENCOUNTER — Telehealth: Payer: Self-pay | Admitting: Internal Medicine

## 2017-04-17 NOTE — Telephone Encounter (Signed)
Spoke with patient and gave her number to [facility] to better verify the absence of contraindications between her pacemaker and the weight scale

## 2017-04-17 NOTE — Telephone Encounter (Signed)
New message     Pt bought a weight watcher body analysis scale and it says to check with you Doctor if you have a pacemaker will this effect her ?

## 2017-06-10 ENCOUNTER — Ambulatory Visit (INDEPENDENT_AMBULATORY_CARE_PROVIDER_SITE_OTHER): Payer: Medicaid Other | Admitting: *Deleted

## 2017-06-10 ENCOUNTER — Telehealth: Payer: Self-pay | Admitting: Cardiology

## 2017-06-10 DIAGNOSIS — I442 Atrioventricular block, complete: Secondary | ICD-10-CM

## 2017-06-10 NOTE — Telephone Encounter (Signed)
Spoke with pt and reminded pt of remote transmission that is due today. Pt verbalized understanding.   

## 2017-06-11 NOTE — Progress Notes (Signed)
Remote pacemaker transmission.   

## 2017-06-13 ENCOUNTER — Encounter: Payer: Self-pay | Admitting: Cardiology

## 2017-06-13 LAB — CUP PACEART REMOTE DEVICE CHECK
Battery Remaining Longevity: 75 mo
Brady Statistic AP VP Percent: 45.14 %
Brady Statistic AP VS Percent: 0.02 %
Brady Statistic AS VP Percent: 50.63 %
Implantable Lead Implant Date: 20160606
Implantable Lead Location: 753860
Implantable Lead Model: 3830
Implantable Lead Model: 5076
Lead Channel Impedance Value: 304 Ohm
Lead Channel Sensing Intrinsic Amplitude: 3.25 mV
Lead Channel Sensing Intrinsic Amplitude: 3.25 mV
Lead Channel Sensing Intrinsic Amplitude: 3.25 mV
Lead Channel Setting Pacing Amplitude: 2.5 V
Lead Channel Setting Pacing Pulse Width: 0.6 ms
MDC IDC LEAD IMPLANT DT: 20160606
MDC IDC LEAD LOCATION: 753859
MDC IDC MSMT BATTERY VOLTAGE: 3 V
MDC IDC MSMT LEADCHNL RA IMPEDANCE VALUE: 399 Ohm
MDC IDC MSMT LEADCHNL RA PACING THRESHOLD AMPLITUDE: 0.625 V
MDC IDC MSMT LEADCHNL RA PACING THRESHOLD PULSEWIDTH: 0.4 ms
MDC IDC MSMT LEADCHNL RA SENSING INTR AMPL: 3.25 mV
MDC IDC MSMT LEADCHNL RV IMPEDANCE VALUE: 418 Ohm
MDC IDC MSMT LEADCHNL RV IMPEDANCE VALUE: 551 Ohm
MDC IDC MSMT LEADCHNL RV PACING THRESHOLD AMPLITUDE: 1.125 V
MDC IDC MSMT LEADCHNL RV PACING THRESHOLD PULSEWIDTH: 0.4 ms
MDC IDC PG IMPLANT DT: 20160606
MDC IDC SESS DTM: 20180731191245
MDC IDC SET LEADCHNL RA PACING AMPLITUDE: 2 V
MDC IDC SET LEADCHNL RV SENSING SENSITIVITY: 0.9 mV
MDC IDC STAT BRADY AS VS PERCENT: 4.21 %
MDC IDC STAT BRADY RA PERCENT PACED: 44.67 %
MDC IDC STAT BRADY RV PERCENT PACED: 95.16 %

## 2017-06-13 NOTE — Progress Notes (Signed)
Letter  

## 2017-06-20 ENCOUNTER — Ambulatory Visit (INDEPENDENT_AMBULATORY_CARE_PROVIDER_SITE_OTHER): Payer: Self-pay | Admitting: Internal Medicine

## 2017-06-20 ENCOUNTER — Encounter: Payer: Self-pay | Admitting: Internal Medicine

## 2017-06-20 VITALS — BP 106/64 | HR 80 | Ht 62.0 in | Wt 171.0 lb

## 2017-06-20 DIAGNOSIS — G909 Disorder of the autonomic nervous system, unspecified: Secondary | ICD-10-CM

## 2017-06-20 DIAGNOSIS — G901 Familial dysautonomia [Riley-Day]: Secondary | ICD-10-CM

## 2017-06-20 DIAGNOSIS — Z950 Presence of cardiac pacemaker: Secondary | ICD-10-CM

## 2017-06-20 DIAGNOSIS — I442 Atrioventricular block, complete: Secondary | ICD-10-CM

## 2017-06-20 NOTE — Progress Notes (Signed)
R dysautonomia     Patient Care Team: Briscoe Deutscher, MD as PCP - General (Family Medicine)   HPI  Laurie Phillips is a 26 y.o. female followup for complete heart block  and symptoms suggestive of dysautonomia   She is s/p His Bundle pacing and has been exercising to a couple of hours a day    Echocardiogram 1/17 was normal  Her symptoms are worse around the time of her menses. She is also much better in the winter. Not interested in OCP  Emotionally stable Fluid and slat intake replete  Some EtOH intake,  Husband suggests may be excessive at times        Past Medical History:  Diagnosis Date  . AVNRT (AV nodal re-entry tachycardia) ([place])   . Bipolar 1 disorder ([place])   . CHB (complete heart block) ([facility])    s/p Dual chamber: Atrial and HIS pacemaker placement  . Complication of anesthesia    itchy after pacemaker placed  . Depression    doesn't take meds  . Dyspnea    sitting/exertion/lying  . Family history of adverse reaction to anesthesia    adopted  . Headache   . Nocturia   . 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 Pneumonia   . Pott's disease   . Sinus bradycardia   . Spinal headache   . Urinary frequency   . Weakness    numbness and tingling in hands    Past Surgical History:  Procedure Laterality Date  . CESAREAN SECTION N/A 03/08/2015   Procedure: CESAREAN SECTION;  Surgeon: Everett Graff, MD;  Location: [facility];  Service: Obstetrics;  Laterality: N/A;  . GANGLION CYST EXCISION Left 11/09/2016   Procedure: LEFT WRIST VOLAR CARPAL GANGLION EXCISION;  Surgeon: Iran Planas, MD;  Location: [facility];  Service: Orthopedics;  Laterality: Left;  . KNEE ARTHROSCOPY Left   . PACEMAKER PLACEMENT    . TONSILLECTOMY      Current Outpatient Prescriptions  Medication Sig Dispense Refill  . ibuprofen (ADVIL,MOTRIN) 400 MG tablet Take 1 tablet (400 mg total) by mouth every 6 (six) hours as needed. 8 tablet 0   No current facility-administered medications for this  visit.     No Known Allergies    Review of Systems negative except from HPI and PMH  Physical Exam BP 106/64   Pulse 80   Ht 5\' 2"  (1.575 m)   Wt 171 lb (77.6 kg)   SpO2 98%   BMI 31.28 kg/m  Well developed and nourished in no acute distress HENT normal Neck supple with JVP-flat Carotids brisk and full without bruits Clear Regular rate and rhythm, no murmurs or gallops Abd-soft with active BS without hepatomegaly No Clubbing cyanosis edema Skin-warm and dry A & Oriented  Grossly normal sensory and motor function   ECG AV pacing with nonselective HIS capture  Assessment and  Plan  Complete heart block  Pacemaker Medtronic   Dysatuonomia  Psycho social stressors    Stress the importance of exercise.  I've encouraged her again to follow-up with her gynecologist (control as her symptoms are clearly worse around the time of her menses  Decrease alcohol

## 2017-06-20 NOTE — Patient Instructions (Signed)
Medication Instructions: - Your physician recommends that you continue on your current medications as directed. Please refer to the Current Medication list given to you today.  Labwork: - none ordered  Procedures/Testing: - none ordered  Follow-Up: - Remote monitoring is used to monitor your Pacemaker of ICD from home. This monitoring reduces the number of office visits required to check your device to one time per year. It allows [place] to keep an eye on the functioning of your device to ensure it is working properly. You are scheduled for a device check from home on 09/09/17. You may send your transmission at any time that day. If you have a wireless device, the transmission will be sent automatically. After your physician reviews your transmission, you will receive a postcard with your next transmission date.  - Your physician wants you to follow-up in: 1 year with Dr. Caryl Comes. You will receive a reminder letter in the mail two months in advance. If you don't receive a letter, please call our office to schedule the follow-up appointment.   Any Additional Special Instructions Will Be Listed Below (If Applicable).     If you need a refill on your cardiac medications before your next appointment, please call your pharmacy.

## 2017-06-24 LAB — CUP PACEART INCLINIC DEVICE CHECK
Battery Remaining Longevity: 75 mo
Battery Voltage: 3 V
Brady Statistic AP VP Percent: 45.15 %
Brady Statistic AS VS Percent: 4.3 %
Brady Statistic RA Percent Paced: 44.65 %
Implantable Lead Location: 753859
Implantable Lead Model: 3830
Implantable Lead Model: 5076
Implantable Pulse Generator Implant Date: 20160606
Lead Channel Impedance Value: 304 Ohm
Lead Channel Impedance Value: 399 Ohm
Lead Channel Impedance Value: 551 Ohm
Lead Channel Pacing Threshold Amplitude: 0.75 V
Lead Channel Pacing Threshold Pulse Width: 0.4 ms
Lead Channel Pacing Threshold Pulse Width: 0.4 ms
Lead Channel Sensing Intrinsic Amplitude: 3.75 mV
Lead Channel Setting Pacing Amplitude: 2 V
Lead Channel Setting Pacing Pulse Width: 0.6 ms
MDC IDC LEAD IMPLANT DT: 20160606
MDC IDC LEAD IMPLANT DT: 20160606
MDC IDC LEAD LOCATION: 753860
MDC IDC MSMT LEADCHNL RA PACING THRESHOLD AMPLITUDE: 0.5 V
MDC IDC MSMT LEADCHNL RV IMPEDANCE VALUE: 399 Ohm
MDC IDC MSMT LEADCHNL RV SENSING INTR AMPL: 3.125 mV
MDC IDC SESS DTM: 20180810132953
MDC IDC SET LEADCHNL RV PACING AMPLITUDE: 2.5 V
MDC IDC SET LEADCHNL RV SENSING SENSITIVITY: 0.9 mV
MDC IDC STAT BRADY AP VS PERCENT: 0.02 %
MDC IDC STAT BRADY AS VP PERCENT: 50.53 %
MDC IDC STAT BRADY RV PERCENT PACED: 95.03 %

## 2017-09-09 ENCOUNTER — Ambulatory Visit (INDEPENDENT_AMBULATORY_CARE_PROVIDER_SITE_OTHER): Payer: Self-pay | Admitting: *Deleted

## 2017-09-09 DIAGNOSIS — I442 Atrioventricular block, complete: Secondary | ICD-10-CM

## 2017-09-11 NOTE — Progress Notes (Signed)
Remote pacemaker transmission.   

## 2017-09-12 LAB — CUP PACEART REMOTE DEVICE CHECK
Battery Remaining Longevity: 74 mo
Battery Voltage: 3 V
Brady Statistic AP VP Percent: 40.36 %
Brady Statistic AP VS Percent: 0.01 %
Brady Statistic AS VS Percent: 0.25 %
Brady Statistic RV Percent Paced: 99.56 %
Date Time Interrogation Session: 20181031013507
Implantable Lead Location: 753859
Implantable Lead Location: 753860
Implantable Pulse Generator Implant Date: 20160606
Lead Channel Impedance Value: 285 Ohm
Lead Channel Impedance Value: 380 Ohm
Lead Channel Pacing Threshold Amplitude: 0.625 V
Lead Channel Pacing Threshold Amplitude: 1 V
Lead Channel Pacing Threshold Pulse Width: 0.4 ms
Lead Channel Sensing Intrinsic Amplitude: 2.875 mV
Lead Channel Sensing Intrinsic Amplitude: 3.5 mV
Lead Channel Sensing Intrinsic Amplitude: 3.5 mV
Lead Channel Setting Pacing Amplitude: 2.5 V
Lead Channel Setting Pacing Pulse Width: 0.6 ms
Lead Channel Setting Sensing Sensitivity: 0.9 mV
MDC IDC LEAD IMPLANT DT: 20160606
MDC IDC LEAD IMPLANT DT: 20160606
MDC IDC MSMT LEADCHNL RA PACING THRESHOLD PULSEWIDTH: 0.4 ms
MDC IDC MSMT LEADCHNL RA SENSING INTR AMPL: 2.875 mV
MDC IDC MSMT LEADCHNL RV IMPEDANCE VALUE: 418 Ohm
MDC IDC MSMT LEADCHNL RV IMPEDANCE VALUE: 551 Ohm
MDC IDC SET LEADCHNL RA PACING AMPLITUDE: 2 V
MDC IDC STAT BRADY AS VP PERCENT: 59.38 %
MDC IDC STAT BRADY RA PERCENT PACED: 40.28 %

## 2017-09-17 ENCOUNTER — Encounter: Payer: Self-pay | Admitting: Cardiology

## 2017-12-09 ENCOUNTER — Ambulatory Visit (INDEPENDENT_AMBULATORY_CARE_PROVIDER_SITE_OTHER): Payer: Self-pay | Admitting: *Deleted

## 2017-12-09 ENCOUNTER — Telehealth: Payer: Self-pay | Admitting: Cardiology

## 2017-12-09 DIAGNOSIS — I442 Atrioventricular block, complete: Secondary | ICD-10-CM

## 2017-12-09 NOTE — Telephone Encounter (Signed)
Spoke with pt and reminded pt of remote transmission that is due today. Pt verbalized understanding.   

## 2017-12-10 NOTE — Progress Notes (Signed)
Remote pacemaker transmission.   

## 2017-12-12 ENCOUNTER — Encounter: Payer: Self-pay | Admitting: Cardiology

## 2017-12-24 LAB — CUP PACEART REMOTE DEVICE CHECK
Brady Statistic AP VP Percent: 40.82 %
Brady Statistic AS VP Percent: 58.96 %
Brady Statistic RA Percent Paced: 40.74 %
Brady Statistic RV Percent Paced: 99.61 %
Date Time Interrogation Session: 20190130153315
Implantable Lead Implant Date: 20160606
Implantable Lead Location: 753859
Implantable Lead Location: 753860
Implantable Lead Model: 3830
Lead Channel Impedance Value: 399 Ohm
Lead Channel Impedance Value: 551 Ohm
Lead Channel Pacing Threshold Amplitude: 0.625 V
Lead Channel Pacing Threshold Pulse Width: 0.4 ms
Lead Channel Sensing Intrinsic Amplitude: 3 mV
Lead Channel Sensing Intrinsic Amplitude: 3 mV
Lead Channel Setting Pacing Amplitude: 2 V
Lead Channel Setting Pacing Amplitude: 2.5 V
Lead Channel Setting Pacing Pulse Width: 0.6 ms
MDC IDC LEAD IMPLANT DT: 20160606
MDC IDC MSMT BATTERY REMAINING LONGEVITY: 72 mo
MDC IDC MSMT BATTERY VOLTAGE: 3 V
MDC IDC MSMT LEADCHNL RA IMPEDANCE VALUE: 285 Ohm
MDC IDC MSMT LEADCHNL RA IMPEDANCE VALUE: 418 Ohm
MDC IDC MSMT LEADCHNL RA SENSING INTR AMPL: 2 mV
MDC IDC MSMT LEADCHNL RA SENSING INTR AMPL: 2 mV
MDC IDC MSMT LEADCHNL RV PACING THRESHOLD AMPLITUDE: 1.125 V
MDC IDC MSMT LEADCHNL RV PACING THRESHOLD PULSEWIDTH: 0.4 ms
MDC IDC PG IMPLANT DT: 20160606
MDC IDC SET LEADCHNL RV SENSING SENSITIVITY: 0.9 mV
MDC IDC STAT BRADY AP VS PERCENT: 0 %
MDC IDC STAT BRADY AS VS PERCENT: 0.21 %

## 2018-03-10 ENCOUNTER — Ambulatory Visit (INDEPENDENT_AMBULATORY_CARE_PROVIDER_SITE_OTHER): Payer: Self-pay | Admitting: *Deleted

## 2018-03-10 ENCOUNTER — Telehealth: Payer: Self-pay | Admitting: Cardiology

## 2018-03-10 DIAGNOSIS — I442 Atrioventricular block, complete: Secondary | ICD-10-CM

## 2018-03-10 NOTE — Telephone Encounter (Signed)
Spoke with pt and reminded pt of remote transmission that is due today. Pt verbalized understanding.   

## 2018-03-11 ENCOUNTER — Encounter: Payer: Self-pay | Admitting: Cardiology

## 2018-03-11 LAB — CUP PACEART REMOTE DEVICE CHECK
Brady Statistic AP VP Percent: 41.57 %
Brady Statistic AP VS Percent: 0 %
Brady Statistic AS VP Percent: 58.29 %
Implantable Lead Implant Date: 20160606
Implantable Lead Location: 753860
Implantable Lead Model: 3830
Implantable Lead Model: 5076
Lead Channel Impedance Value: 304 Ohm
Lead Channel Impedance Value: 399 Ohm
Lead Channel Impedance Value: 513 Ohm
Lead Channel Pacing Threshold Pulse Width: 0.4 ms
Lead Channel Sensing Intrinsic Amplitude: 3 mV
Lead Channel Sensing Intrinsic Amplitude: 3.125 mV
Lead Channel Sensing Intrinsic Amplitude: 3.125 mV
Lead Channel Setting Pacing Amplitude: 2 V
Lead Channel Setting Pacing Amplitude: 2.5 V
Lead Channel Setting Pacing Pulse Width: 0.6 ms
MDC IDC LEAD IMPLANT DT: 20160606
MDC IDC LEAD LOCATION: 753859
MDC IDC MSMT BATTERY REMAINING LONGEVITY: 64 mo
MDC IDC MSMT BATTERY VOLTAGE: 3 V
MDC IDC MSMT LEADCHNL RA PACING THRESHOLD AMPLITUDE: 0.625 V
MDC IDC MSMT LEADCHNL RA PACING THRESHOLD PULSEWIDTH: 0.4 ms
MDC IDC MSMT LEADCHNL RA SENSING INTR AMPL: 3 mV
MDC IDC MSMT LEADCHNL RV IMPEDANCE VALUE: 532 Ohm
MDC IDC MSMT LEADCHNL RV PACING THRESHOLD AMPLITUDE: 1 V
MDC IDC PG IMPLANT DT: 20160606
MDC IDC SESS DTM: 20190430232432
MDC IDC SET LEADCHNL RV SENSING SENSITIVITY: 0.9 mV
MDC IDC STAT BRADY AS VS PERCENT: 0.14 %
MDC IDC STAT BRADY RA PERCENT PACED: 41.49 %
MDC IDC STAT BRADY RV PERCENT PACED: 99.71 %

## 2018-03-11 NOTE — Progress Notes (Signed)
Remote pacemaker transmission.   

## 2018-03-11 NOTE — Progress Notes (Signed)
Letter  

## 2018-06-09 ENCOUNTER — Ambulatory Visit (INDEPENDENT_AMBULATORY_CARE_PROVIDER_SITE_OTHER): Payer: Self-pay | Admitting: *Deleted

## 2018-06-09 ENCOUNTER — Telehealth: Payer: Self-pay

## 2018-06-09 DIAGNOSIS — I442 Atrioventricular block, complete: Secondary | ICD-10-CM

## 2018-06-09 NOTE — Telephone Encounter (Signed)
I spoke with the patient about this.

## 2018-06-10 ENCOUNTER — Encounter: Payer: Self-pay | Admitting: Cardiology

## 2018-06-10 NOTE — Progress Notes (Signed)
Remote pacemaker transmission.   

## 2018-06-22 LAB — CUP PACEART REMOTE DEVICE CHECK
Battery Remaining Longevity: 62 mo
Battery Voltage: 3 V
Brady Statistic AS VP Percent: 55.75 %
Brady Statistic AS VS Percent: 0.18 %
Implantable Lead Implant Date: 20160606
Implantable Lead Location: 753859
Implantable Lead Model: 3830
Implantable Pulse Generator Implant Date: 20160606
Lead Channel Impedance Value: 285 Ohm
Lead Channel Impedance Value: 456 Ohm
Lead Channel Impedance Value: 532 Ohm
Lead Channel Pacing Threshold Amplitude: 0.625 V
Lead Channel Pacing Threshold Amplitude: 1.25 V
Lead Channel Pacing Threshold Pulse Width: 0.4 ms
Lead Channel Sensing Intrinsic Amplitude: 2.875 mV
Lead Channel Sensing Intrinsic Amplitude: 2.875 mV
Lead Channel Setting Sensing Sensitivity: 0.9 mV
MDC IDC LEAD IMPLANT DT: 20160606
MDC IDC LEAD LOCATION: 753860
MDC IDC MSMT LEADCHNL RA PACING THRESHOLD PULSEWIDTH: 0.4 ms
MDC IDC MSMT LEADCHNL RV IMPEDANCE VALUE: 399 Ohm
MDC IDC MSMT LEADCHNL RV SENSING INTR AMPL: 3.75 mV
MDC IDC MSMT LEADCHNL RV SENSING INTR AMPL: 3.75 mV
MDC IDC SESS DTM: 20190730224106
MDC IDC SET LEADCHNL RA PACING AMPLITUDE: 2 V
MDC IDC SET LEADCHNL RV PACING AMPLITUDE: 2.5 V
MDC IDC SET LEADCHNL RV PACING PULSEWIDTH: 0.6 ms
MDC IDC STAT BRADY AP VP PERCENT: 44.07 %
MDC IDC STAT BRADY AP VS PERCENT: 0 %
MDC IDC STAT BRADY RA PERCENT PACED: 44 %
MDC IDC STAT BRADY RV PERCENT PACED: 99.69 %

## 2018-07-09 ENCOUNTER — Encounter: Payer: Self-pay | Admitting: Internal Medicine

## 2018-07-09 ENCOUNTER — Ambulatory Visit (INDEPENDENT_AMBULATORY_CARE_PROVIDER_SITE_OTHER): Payer: Self-pay | Admitting: Internal Medicine

## 2018-07-09 VITALS — BP 111/75 | HR 60 | Ht 62.0 in | Wt 160.6 lb

## 2018-07-09 DIAGNOSIS — I442 Atrioventricular block, complete: Secondary | ICD-10-CM

## 2018-07-09 DIAGNOSIS — G901 Familial dysautonomia [Riley-Day]: Secondary | ICD-10-CM

## 2018-07-09 DIAGNOSIS — Z950 Presence of cardiac pacemaker: Secondary | ICD-10-CM

## 2018-07-09 LAB — CUP PACEART INCLINIC DEVICE CHECK
Battery Voltage: 3 V
Brady Statistic AS VP Percent: 58 %
Brady Statistic RA Percent Paced: 41.72 %
Date Time Interrogation Session: 20190829171848
Implantable Lead Implant Date: 20160606
Implantable Lead Implant Date: 20160606
Implantable Lead Location: 753859
Implantable Lead Model: 3830
Implantable Pulse Generator Implant Date: 20160606
Lead Channel Impedance Value: 285 Ohm
Lead Channel Impedance Value: 399 Ohm
Lead Channel Pacing Threshold Amplitude: 1 V
Lead Channel Pacing Threshold Pulse Width: 0.4 ms
Lead Channel Pacing Threshold Pulse Width: 0.6 ms
Lead Channel Sensing Intrinsic Amplitude: 3 mV
Lead Channel Sensing Intrinsic Amplitude: 3 mV
Lead Channel Sensing Intrinsic Amplitude: 3.25 mV
Lead Channel Setting Pacing Amplitude: 2 V
Lead Channel Setting Pacing Pulse Width: 0.6 ms
MDC IDC LEAD LOCATION: 753860
MDC IDC MSMT BATTERY REMAINING LONGEVITY: 62 mo
MDC IDC MSMT LEADCHNL RA IMPEDANCE VALUE: 437 Ohm
MDC IDC MSMT LEADCHNL RV IMPEDANCE VALUE: 532 Ohm
MDC IDC MSMT LEADCHNL RV PACING THRESHOLD AMPLITUDE: 1 V
MDC IDC MSMT LEADCHNL RV SENSING INTR AMPL: 3 mV
MDC IDC SET LEADCHNL RV PACING AMPLITUDE: 2.5 V
MDC IDC SET LEADCHNL RV SENSING SENSITIVITY: 0.9 mV
MDC IDC STAT BRADY AP VP PERCENT: 41.8 %
MDC IDC STAT BRADY AP VS PERCENT: 0 %
MDC IDC STAT BRADY AS VS PERCENT: 0.2 %
MDC IDC STAT BRADY RV PERCENT PACED: 99.64 %

## 2018-07-09 NOTE — Patient Instructions (Signed)

## 2018-07-09 NOTE — Progress Notes (Signed)
R dysautonomia     Patient Care Team: Briscoe Deutscher, MD as PCP - General (Family Medicine)   HPI  Laurie Phillips is a 27 y.o. female followup for complete heart block  and symptoms suggestive of dysautonomia   She is s/p His Bundle pacing and has been exercising to a couple of hours a day    Echocardiogram 1/17 was normal  She is doing better than for some time; mood is good.  Exercise tolerance is mildly limiting, bending and exercising  Thinking about gooing back to work  58 yr old son has been delayed w verbal skills  Now on therapy and making progress        Past Medical History:  Diagnosis Date  . AVNRT (AV nodal re-entry tachycardia) ([place])   . Bipolar 1 disorder ([place])   . CHB (complete heart block) ([facility])    s/p Dual chamber: Atrial and HIS pacemaker placement  . Complication of anesthesia    itchy after pacemaker placed  . Depression    doesn't take meds  . Dyspnea    sitting/exertion/lying  . Family history of adverse reaction to anesthesia    adopted  . Headache   . Nocturia   . 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 Pneumonia   . Pott's disease   . Sinus bradycardia   . Spinal headache   . Urinary frequency   . Weakness    numbness and tingling in hands    Past Surgical History:  Procedure Laterality Date  . CESAREAN SECTION N/A 03/08/2015   Procedure: CESAREAN SECTION;  Surgeon: Everett Graff, MD;  Location: [facility];  Service: Obstetrics;  Laterality: N/A;  . GANGLION CYST EXCISION Left 11/09/2016   Procedure: LEFT WRIST VOLAR CARPAL GANGLION EXCISION;  Surgeon: Iran Planas, MD;  Location: [facility];  Service: Orthopedics;  Laterality: Left;  . KNEE ARTHROSCOPY Left   . PACEMAKER PLACEMENT    . TONSILLECTOMY      Current Outpatient Medications  Medication Sig Dispense Refill  . ibuprofen (ADVIL,MOTRIN) 400 MG tablet Take 1 tablet (400 mg total) by mouth every 6 (six) hours as needed. 8 tablet 0   No current facility-administered medications for this  visit.     No Known Allergies    Review of Systems negative except from HPI and PMH  Physical Exam BP 111/75   Pulse 60   Ht 5\' 2"  (1.575 m)   Wt 160 lb 9.6 oz (72.8 kg)   SpO2 99%   BMI 29.37 kg/m  Well developed and nourished in no acute distress HENT normal Neck supple with JVP-flat Clear Device pocket well healed; without hematoma or erythema.  There is no tethering  Regular rate and rhythm, no murmurs or gallops Abd-soft with active BS No Clubbing cyanosis edema Skin-warm and dry A & Oriented  Grossly normal sensory and motor function    ECG P-synchronous/ AV  pacing with nonselective His pacing   Assessment and  Plan  Complete heart block  Pacemaker Medtronic   Dysatuonomia  Psycho social stressors  Device function is normal  She is improving functional status.  We discussed extensively the issues of dysautonomia, the physiology of orthstasis and positional stress.  We discussed the role of salt and water repletion, the importance of exercise, often needing to be started in the recumbent position, and the awareness of triggers and the role of ambient heat and dehydration

## 2018-08-19 ENCOUNTER — Telehealth: Payer: Self-pay | Admitting: Internal Medicine

## 2018-08-19 ENCOUNTER — Emergency Department (HOSPITAL_COMMUNITY): Payer: Medicaid Other

## 2018-08-19 ENCOUNTER — Other Ambulatory Visit: Payer: Self-pay

## 2018-08-19 ENCOUNTER — Encounter (HOSPITAL_COMMUNITY): Payer: Self-pay | Admitting: *Deleted

## 2018-08-19 ENCOUNTER — Emergency Department (HOSPITAL_COMMUNITY)
Admission: EM | Admit: 2018-08-19 | Discharge: 2018-08-20 | Disposition: A | Discharge status: Home / Self Care | Payer: Medicaid Other | Attending: Emergency Medicine | Admitting: Emergency Medicine

## 2018-08-19 DIAGNOSIS — Z87891 Personal history of nicotine dependence: Secondary | ICD-10-CM | POA: Insufficient documentation

## 2018-08-19 DIAGNOSIS — R079 Chest pain, unspecified: Secondary | ICD-10-CM

## 2018-08-19 DIAGNOSIS — Z79899 Other long term (current) drug therapy: Secondary | ICD-10-CM | POA: Insufficient documentation

## 2018-08-19 DIAGNOSIS — R0789 Other chest pain: Secondary | ICD-10-CM | POA: Insufficient documentation

## 2018-08-19 DIAGNOSIS — Z950 Presence of cardiac pacemaker: Secondary | ICD-10-CM | POA: Insufficient documentation

## 2018-08-19 DIAGNOSIS — G901 Familial dysautonomia [Riley-Day]: Secondary | ICD-10-CM

## 2018-08-19 LAB — BASIC METABOLIC PANEL
ANION GAP: 12 (ref 5–15)
BUN: 9 mg/dL (ref 6–20)
CO2: 21 mmol/L — AB (ref 22–32)
Calcium: 9.4 mg/dL (ref 8.9–10.3)
Chloride: 103 mmol/L (ref 98–111)
Creatinine, Ser: 0.73 mg/dL (ref 0.44–1.00)
GFR calc Af Amer: >60 mL/min (ref >60)
GFR calc non Af Amer: >60 mL/min (ref >60)
GLUCOSE: 94 mg/dL (ref 70–99)
Potassium: 4.1 mmol/L (ref 3.5–5.1)
Sodium: 136 mmol/L (ref 135–145)

## 2018-08-19 LAB — CBC
HCT: 40.4 % (ref 36.0–46.0)
HEMOGLOBIN: 13.7 g/dL (ref 12.0–15.0)
MCH: 30.3 pg (ref 26.0–34.0)
MCHC: 33.9 g/dL (ref 30.0–36.0)
MCV: 89.4 fL (ref 80.0–100.0)
Platelets: 418 10*3/uL — ABNORMAL HIGH (ref 150–400)
RBC: 4.52 MIL/uL (ref 3.87–5.11)
RDW: 12.1 % (ref 11.5–15.5)
WBC: 10.3 10*3/uL (ref 4.0–10.5)
nRBC: 0 % (ref 0.0–0.2)

## 2018-08-19 LAB — I-STAT BETA HCG BLOOD, ED (MC, WL, AP ONLY): I-stat hCG, quantitative: <5 m[IU]/mL (ref <5)

## 2018-08-19 LAB — TROPONIN I: Troponin I: <0.03 ng/mL (ref <0.03)

## 2018-08-19 MED ORDER — SODIUM CHLORIDE 0.9 % IV BOLUS
2000.0000 mL | Freq: Once | INTRAVENOUS | Status: AC
  Administered 2018-08-20: 2000 mL via INTRAVENOUS

## 2018-08-19 MED ORDER — METHOCARBAMOL 500 MG PO TABS
1000.0000 mg | ORAL_TABLET | Freq: Once | ORAL | Status: AC
  Administered 2018-08-19: 1000 mg via ORAL
  Filled 2018-08-19: qty 2

## 2018-08-19 MED ORDER — IBUPROFEN 200 MG PO TABS
600.0000 mg | ORAL_TABLET | Freq: Once | ORAL | Status: AC
Start: 2018-08-19 — End: 2018-08-19
  Administered 2018-08-19: 600 mg via ORAL
  Filled 2018-08-19: qty 1

## 2018-08-19 NOTE — ED Notes (Signed)
Pt is sitting out here on her phone and acting fine until her dad gets all bent out of shape and patient starts crying and starts hyperventilating. Pt has been laughing and talking with family until she starts getting all worked up and then gets very emotional. Pt has called her MD and the dr called this Therapist, sports. The Md says she sees everything is clear and normal but they are reacting historically.

## 2018-08-19 NOTE — ED Triage Notes (Signed)
thwe pt has a pacemaker and since yesterday she has has a rapid hart rate and she is having chest pain and pain up into her lt neck and jaw  Exertion causes more rapid heart rate and more pain.   Deep breathing causes  Pain  lmp  septewmber 24

## 2018-08-19 NOTE — Telephone Encounter (Signed)
New Message:    STAT if HR is under 50 or over 120 (normal HR is 60-100 beats per minute)  1) What is your heart rate? 120-140  2) Do you have a log of your heart rate readings (document readings)? NO   3) Do you have any other symptoms? Chest Pressure, throat and neck pain

## 2018-08-19 NOTE — ED Notes (Signed)
Pt complains of chest pain that shoots up to her neck and back of her head when she stands. Pt also complains of dizziness when this occurs.

## 2018-08-19 NOTE — ED Notes (Signed)
Pacemaker interrogated. 

## 2018-08-19 NOTE — ED Provider Notes (Signed)
[facility] Provider Note   CSN: [id number] Arrival date & time: 08/19/18  1756     History   Chief Complaint Chief Complaint  Patient presents with  . Chest Pain  . Pacemaker Problem    HPI Laurie Phillips is a 27 y.o. female with a past medical history of POTS, Complete heart block with pacer in place.  She has had two days of chest pain, nothing helps it get better.  She reports her heart rate is increasing to the 150s with shob, numbness in hands and feet, pain radiates into left arm and neck, and awakens her at night.  Her elevated heart rates are according to the heart rate on her Apple Watch and only last for a few seconds.  She reports no abdominal pain.  She has not had any recent surgical release or immobilizations, does not take any hormone based birth control.  She does not have any personal history of blood clots.  HPI  Past Medical History:  Diagnosis Date  . AVNRT (AV nodal re-entry tachycardia) ([place])   . Bipolar 1 disorder ([place])   . CHB (complete heart block) ([facility])    s/p Dual chamber: Atrial and HIS pacemaker placement  . Complication of anesthesia    itchy after pacemaker placed  . Depression    doesn't take meds  . Dyspnea    sitting/exertion/lying  . Family history of adverse reaction to anesthesia    adopted  . Headache   . Nocturia   . 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 Pneumonia   . Pott's disease   . Sinus bradycardia   . Spinal headache   . Urinary frequency   . Weakness    numbness and tingling in hands    Patient Active Problem List   Diagnosis Date Noted  . 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 Pain in the chest   . Complete heart block ([place]) 11/27/2015  . 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 Status post primary low transverse cesarean section 03/08/2015  . Pre-eclampsia   . Chest pain 10/14/2014  . Bipolar disorder ([place]) 10/14/2014  . Generalized anxiety disorder 10/14/2014  . Rh negative, antepartum  10/14/2014  . Fibroid x 2, small 10/14/2014  . ADHD (attention deficit hyperactivity disorder) 10/14/2014    Past Surgical History:  Procedure Laterality Date  . CESAREAN SECTION N/A 03/08/2015   Procedure: CESAREAN SECTION;  Surgeon: Everett Graff, MD;  Location: [facility];  Service: Obstetrics;  Laterality: N/A;  . GANGLION CYST EXCISION Left 11/09/2016   Procedure: LEFT WRIST VOLAR CARPAL GANGLION EXCISION;  Surgeon: Iran Planas, MD;  Location: [facility];  Service: Orthopedics;  Laterality: Left;  . KNEE ARTHROSCOPY Left   . PACEMAKER PLACEMENT    . TONSILLECTOMY       OB History    Gravida  2   Para  1   Term  0   Preterm  1   AB  1   Living  1     SAB  1   TAB  0   Ectopic  0   Multiple  0   Live Births  1            Home Medications    Prior to Admission medications   Medication Sig Start Date End Date Taking? Authorizing Provider  Multiple Vitamin (MULTIVITAMIN WITH MINERALS) TABS tablet Take 1 tablet by mouth once a week.   Yes [provider]  ibuprofen (ADVIL,MOTRIN) 400 MG tablet Take 1  tablet (400 mg total) by mouth every 6 (six) hours as needed. Patient not taking: Reported on 08/20/2018 03/19/17   Davonna Belling, MD  methocarbamol (ROBAXIN) 750 MG tablet Take 1-2 tablets (750-1,500 mg total) by mouth 3 (three) times daily as needed for muscle spasms. 08/20/18   Lorin Glass, PA-C    Family History Family History  Adopted: Yes    Social History Social History   Tobacco Use  . Smoking status: Former Research scientist (life sciences)  . Smokeless tobacco: Never Used  . Tobacco comment: quit smoking a couple of yrs ago  Substance Use Topics  . Alcohol use: Yes    Alcohol/week: 0.0 standard drinks    Comment: glass or 2 at dinner  . Drug use: No     Allergies   Patient has no known allergies.   Review of Systems Review of Systems  Constitutional: Positive for fatigue. Negative for chills and fever.  HENT: Negative for congestion.   Eyes:  Negative for visual disturbance.  Respiratory: Positive for cough (Occasionally), chest tightness and shortness of breath.   Cardiovascular: Positive for chest pain. Negative for palpitations and leg swelling.  Gastrointestinal: Negative for abdominal pain, nausea and vomiting.  Genitourinary: Negative for difficulty urinating and dysuria.  Neurological: Positive for light-headedness (With any positional change or activity) and numbness (Transient location between all 4 extremities, intermittent). Negative for weakness.  All other systems reviewed and are negative.    Physical Exam Updated Vital Signs BP 105/72   Pulse 76   Temp 98 F (36.7 C) (Oral)   Resp 19   Ht 5\' 2"  (1.575 m)   Wt 72.6 kg   LMP 08/04/2018   SpO2 97%   BMI 29.26 kg/m   Physical Exam  Constitutional: She appears well-developed and well-nourished.  Non-toxic appearance. No distress.  HENT:  Head: Normocephalic and atraumatic.  Eyes: Conjunctivae are normal.  Neck: Normal range of motion. Neck supple.  Cardiovascular: Normal rate, regular rhythm and normal pulses.  No murmur heard. Pulses:      Radial pulses are 2+ on the right side, and 2+ on the left side.       Dorsalis pedis pulses are 2+ on the right side, and 2+ on the left side.       Posterior tibial pulses are 2+ on the right side, and 2+ on the left side.  Pulmonary/Chest: Effort normal and breath sounds normal. No respiratory distress. She has no decreased breath sounds. She has no wheezes. She has no rhonchi. She has no rales.  Abdominal: Soft. Bowel sounds are normal. She exhibits no distension. There is no tenderness. There is no guarding.  Musculoskeletal: Normal range of motion. She exhibits no edema.       Right lower leg: Normal. She exhibits no tenderness and no edema.       Left lower leg: Normal. She exhibits no tenderness and no edema.  There is diffuse tenderness to palpation over C/T-spine paraspinal muscles.  There is no midline  tenderness to palpation.  There is generalized tenderness over the distribution of the trapezius muscle.  Palpation over this area both re-creates and exacerbates her reported jaw, neck, and back pain.  Neurological: She is alert.  Skin: Skin is warm and dry.  Psychiatric: She has a normal mood and affect.  Nursing note and vitals reviewed.    ED Treatments / Results  Labs (all labs ordered are listed, but only abnormal results are displayed) Labs Reviewed  BASIC METABOLIC PANEL -  Abnormal; Notable for the following components:      Result Value   CO2 21 (*)    All other components within normal limits  CBC - Abnormal; Notable for the following components:   Platelets 418 (*)    All other components within normal limits  TROPONIN I  I-STAT BETA HCG BLOOD, ED ([facility], [facility], [facility] ONLY)  I-STAT TROPONIN, ED    EKG EKG Interpretation  Date/Time:  Wednesday August 19 2018 20:55:11 EDT Ventricular Rate:  78 PR Interval:  200 QRS Duration: 152 QT Interval:  418 QTC Calculation: 476 R Axis:   80 Text Interpretation:  Atrial-sensed ventricular-paced rhythm Abnormal ECG No significant change since last tracing Confirmed by Deno Etienne [id number]) on 08/19/2018 11:56:15 PM   Radiology Dg Chest 2 View  Result Date: 08/19/2018 CLINICAL DATA:  Chest pain and tachycardia EXAM: CHEST - 2 VIEW COMPARISON:  03/19/2017 FINDINGS: Pacing device is again seen and stable. Cardiac shadow is within normal limits. The lungs are well aerated bilaterally without focal infiltrate. No bony abnormality is seen. IMPRESSION: No acute abnormality noted. Electronically Signed   By: Inez Catalina M.D.   On: 08/19/2018 18:58    Procedures Procedures (including critical care time)  Orthostatic VS for the past 24 hrs:  BP- Lying Pulse- Lying BP- Sitting Pulse- Sitting BP- Standing at 0 minutes Pulse- Standing at 0 minutes  08/20/18 0054 99/55 68 121/82 73 113/82 81  08/19/18 2335 96/63 71 109/81 83 109/71 90       Medications Ordered in ED Medications  ibuprofen (ADVIL,MOTRIN) tablet 600 mg (600 mg Oral Given 08/19/18 2357)  methocarbamol (ROBAXIN) tablet 1,000 mg (1,000 mg Oral Given 08/19/18 2357)  sodium chloride 0.9 % bolus 2,000 mL (0 mLs Intravenous Stopped 08/20/18 0118)     Initial Impression / Assessment and Plan / ED Course  I have reviewed the triage vital signs and the nursing notes.  Pertinent labs & imaging results that were available during my care of the patient were reviewed by me and considered in my medical decision making (see chart for details).  Clinical Course as of Aug 21 351  Wed Aug 19, 2018  2257 Patient was able to ambulate with out difficulty to restroom with sister   [EH]    Clinical Course User Index [EH] Lorin Glass, PA-C   Patient presents today for evaluation of chest pain, shortness of breath, rapid heart rates.  She has a Psychologist, forensic, and a history of POTS.  Blasdel DVT is low risk, PERC is negative.  EKG is with out acute abnormalities.  Troponin x2 normal.  HCG negative.  She is not anemic, with out significant electrolyte or hematologic abnormalities.  She was initially orthostatic, she was given two liters of fluids after which her blood pressure increased when going from laying to standing with HR increasing by under 15 BPM, an improvement from pre bolus fluids.  Her chest pain is easily reproducible with palpation of chest and sternocostal joints.  Her neck pain is reproducible with palpation over trapezius muscles bilaterally.  She was treated in the ED with ibuprofen and robaxin after which she reports her chest pain and neck pain were both better.    Her pacemaker was interrogated, appears to be functioning normally with out significant events.  Her reported episodes of tachycardia are based off the readings of her apple watch and, when compared with pace maker data, appear to be the watch over reading slight variations in HR.  Patient was  instructed to follow up with her cardiologist and PCP.  The importance of compliance with NSAIDS to help with her costochondritis was stressed.    Return precautions were discussed with patient who states their understanding.  At the time of discharge patient denied any unaddressed complaints or concerns.  Patient is agreeable for discharge home.    Final Clinical Impressions(s) / ED Diagnoses   Final diagnoses:  Chest pain, unspecified type  Dysautonomia [facility])    ED Discharge Orders         Ordered    methocarbamol (ROBAXIN) 750 MG tablet  3 times daily PRN     08/20/18 0128           Lorin Glass, PA-C 08/20/18 [place], Dan, DO 08/20/18 1524

## 2018-08-19 NOTE — Telephone Encounter (Signed)
Patient called informing us that she recently started having chest pressure/pain, throat/neck pain and jaw pain the past 2 days.  Her breathing is labored and she feels a throbbing pain in her chest when she takes a deep breath.  Her HR is at 120 at rest and increases to 140 with exertion. I recommended that she gets evaluated at the ED.  She verbalized understanding and will make her way over there.

## 2018-08-19 NOTE — ED Notes (Addendum)
Pt reports now the pain in her neck is shooting down her right arm and also a tingling feeling in both feet. Also states her pain and heart rate shoots up into the 130s when talking or position changes. Noticed HR going from 70s to the 90s while pt talking in triage. EKG done. Hx of POTS but reports this feels different. [place] explained to pt and apologized for wait.

## 2018-08-20 LAB — I-STAT TROPONIN, ED: TROPONIN I, POC: 0 ng/mL (ref 0.00–0.08)

## 2018-08-20 MED ORDER — METHOCARBAMOL 750 MG PO TABS: 750.0000 mg | ORAL_TABLET | Freq: Three times a day (TID) | ORAL | 0 refills | Status: DC | PRN

## 2018-08-20 NOTE — Discharge Instructions (Signed)

## 2018-08-21 NOTE — Telephone Encounter (Signed)
Dr Caryl Comes aware of pt's ED visit.

## 2018-08-21 NOTE — Telephone Encounter (Signed)
Device clinic appointment scheduled for 10/14 @ 1000. Patient verbalized understanding.

## 2018-08-24 ENCOUNTER — Ambulatory Visit (INDEPENDENT_AMBULATORY_CARE_PROVIDER_SITE_OTHER): Payer: Self-pay | Admitting: *Deleted

## 2018-08-24 DIAGNOSIS — Z950 Presence of cardiac pacemaker: Secondary | ICD-10-CM

## 2018-08-24 DIAGNOSIS — I442 Atrioventricular block, complete: Secondary | ICD-10-CM

## 2018-08-24 LAB — CUP PACEART INCLINIC DEVICE CHECK
Battery Remaining Longevity: 61 mo
Battery Voltage: 3 V
Brady Statistic AP VP Percent: 57.52 %
Brady Statistic AP VS Percent: 0 %
Brady Statistic AS VS Percent: 0.01 %
Brady Statistic RV Percent Paced: 99.97 %
Implantable Lead Implant Date: 20160606
Implantable Lead Implant Date: 20160606
Implantable Lead Location: 753860
Implantable Pulse Generator Implant Date: 20160606
Lead Channel Impedance Value: 285 Ohm
Lead Channel Impedance Value: 399 Ohm
Lead Channel Impedance Value: 418 Ohm
Lead Channel Pacing Threshold Amplitude: 0.625 V
Lead Channel Pacing Threshold Amplitude: 1 V
Lead Channel Pacing Threshold Pulse Width: 0.4 ms
Lead Channel Pacing Threshold Pulse Width: 0.4 ms
Lead Channel Sensing Intrinsic Amplitude: 2.5 mV
Lead Channel Sensing Intrinsic Amplitude: 3 mV
Lead Channel Setting Pacing Amplitude: 2 V
Lead Channel Setting Pacing Amplitude: 2.5 V
Lead Channel Setting Sensing Sensitivity: 0.9 mV
MDC IDC LEAD LOCATION: 753859
MDC IDC MSMT LEADCHNL RA SENSING INTR AMPL: 2.5 mV
MDC IDC MSMT LEADCHNL RV IMPEDANCE VALUE: 532 Ohm
MDC IDC MSMT LEADCHNL RV SENSING INTR AMPL: 3 mV
MDC IDC SESS DTM: 20191014102256
MDC IDC SET LEADCHNL RV PACING PULSEWIDTH: 0.6 ms
MDC IDC STAT BRADY AS VP PERCENT: 42.48 %
MDC IDC STAT BRADY RA PERCENT PACED: 57.5 %

## 2018-08-24 NOTE — Progress Notes (Signed)
Patient presents today to the [facility] for [facility] detection reprogramming. Due to the need for a high Upper Tracking Rate, VT detection is >200bpm. UTR decreased from 180bpm to 170bpm and VT detection was programmed at 176bpm (lowest allowable by device). Programming explained to patient and I advised her to call back with any symptoms in the future. She verbalizes understanding. She did report to me her negative experiences in the ER and reports that she spoke with someone in Patient Experience.

## 2018-09-08 ENCOUNTER — Encounter: Payer: Self-pay | Admitting: *Deleted

## 2018-09-09 ENCOUNTER — Encounter: Payer: Self-pay | Admitting: Cardiology

## 2018-10-23 ENCOUNTER — Telehealth: Payer: Self-pay | Admitting: Cardiology

## 2018-10-23 NOTE — Telephone Encounter (Signed)
Patient called and stated that she just received a new job and she is wearing a new name magnetic name tag on the opposite side of her device. She wanted to know if this is ok. After consulting with Device Tech RN I informed pt that this is ok. Pt verbalized understanding.

## 2019-08-12 ENCOUNTER — Encounter: Payer: Medicaid Other | Admitting: Internal Medicine

## 2019-09-02 ENCOUNTER — Ambulatory Visit (INDEPENDENT_AMBULATORY_CARE_PROVIDER_SITE_OTHER): Payer: Self-pay | Admitting: Internal Medicine

## 2019-09-02 ENCOUNTER — Other Ambulatory Visit: Payer: Self-pay

## 2019-09-02 ENCOUNTER — Encounter: Payer: Self-pay | Admitting: Internal Medicine

## 2019-09-02 VITALS — BP 109/74 | HR 60 | Ht 62.0 in | Wt 167.4 lb

## 2019-09-02 DIAGNOSIS — Z950 Presence of cardiac pacemaker: Secondary | ICD-10-CM

## 2019-09-02 DIAGNOSIS — I442 Atrioventricular block, complete: Secondary | ICD-10-CM

## 2019-09-02 DIAGNOSIS — G901 Familial dysautonomia [Riley-Day]: Secondary | ICD-10-CM

## 2019-09-02 NOTE — Patient Instructions (Addendum)
Medication Instructions:  Your physician recommends that you continue on your current medications as directed. Please refer to the Current Medication list given to you today.  * If you need a refill on your cardiac medications before your next appointment, please call your pharmacy.   Labwork: None ordered  Testing/Procedures: None ordered  Follow-Up: Remote monitoring is used to monitor your Pacemaker of ICD from home. This monitoring reduces the number of office visits required to check your device to one time per year. It allows [place] to keep an eye on the functioning of your device to ensure it is working properly. You are scheduled for a device check from home on 12/02/2019. You may send your transmission at any time that day. If you have a wireless device, the transmission will be sent automatically. After your physician reviews your transmission, you will receive a postcard with your next transmission date.  At [facility], you and your health needs are our priority.  As part of our continuing mission to provide you with exceptional heart care, we have created designated Provider Care Teams.  These Care Teams include your primary Cardiologist (physician) and Advanced Practice Providers (APPs -  Physician Assistants and Nurse Practitioners) who all work together to provide you with the care you need, when you need it.  You will need a follow up appointment in 1 year.  Please call our office 2 months in advance to schedule this appointment.  You may see Dr Curt Bears or one of the following Advanced Practice Providers on your designated Care Team:    Chanetta Marshall, NP  Tommye Standard, PA-C  Oda Kilts, [place]  Thank you for choosing [facility]!!    Any Other Special Instructions Will Be Listed Below (If Applicable).

## 2019-09-02 NOTE — Progress Notes (Signed)
R dysautonomia     Patient Care Team: Briscoe Deutscher, MD as PCP - General (Family Medicine)   HPI  Laurie Phillips is a 28 y.o. female followup for complete heart block  and symptoms suggestive of dysautonomia   She is s/p His Bundle pacing and has been exercising to a couple of hours a day    Echocardiogram 1/17 was normal  Doing exceptionally well.  No dyspnea.  No lightheadedness.  No exercise intolerance.  33-year-old son-Robbie-has done much better with his verbal skills.  He is still doing speech therapy but is talking and communicating.  She and her husband just bought a house. 3 Mrs. 1 is gone and a year she will play  Past Medical History:  Diagnosis Date  . AVNRT (AV nodal re-entry tachycardia) ([place])   . Bipolar 1 disorder ([place])   . CHB (complete heart block) ([facility])    s/p Dual chamber: Atrial and HIS pacemaker placement  . Complication of anesthesia    itchy after pacemaker placed  . Depression    doesn't take meds  . Dyspnea    sitting/exertion/lying  . Family history of adverse reaction to anesthesia    adopted  . Headache   . Nocturia   . 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 Pneumonia   . Pott's disease   . Sinus bradycardia   . Spinal headache   . Urinary frequency   . Weakness    numbness and tingling in hands    Past Surgical History:  Procedure Laterality Date  . CESAREAN SECTION N/A 03/08/2015   Procedure: CESAREAN SECTION;  Surgeon: Everett Graff, MD;  Location: [facility];  Service: Obstetrics;  Laterality: N/A;  . GANGLION CYST EXCISION Left 11/09/2016   Procedure: LEFT WRIST VOLAR CARPAL GANGLION EXCISION;  Surgeon: Iran Planas, MD;  Location: [facility];  Service: Orthopedics;  Laterality: Left;  . KNEE ARTHROSCOPY Left   . PACEMAKER PLACEMENT    . TONSILLECTOMY      Current Outpatient Medications  Medication Sig Dispense Refill  . FLUoxetine (PROZAC) 10 MG capsule Take 10 mg by mouth daily.    [name] ibuprofen (ADVIL,MOTRIN) 400 MG tablet Take 1  tablet (400 mg total) by mouth every 6 (six) hours as needed. 8 tablet 0  . Multiple Vitamin (MULTIVITAMIN WITH MINERALS) TABS tablet Take 1 tablet by mouth once a week.     No current facility-administered medications for this visit.     No Known Allergies    Review of Systems negative except from HPI and PMH  Physical Exam BP 109/74   Pulse 60   Ht 5\' 2"  (1.575 m)   Wt 167 lb 6.4 oz (75.9 kg)   SpO2 98%   BMI 30.62 kg/m  Well developed and well nourished in no acute distress HENT normal Neck supple with JVP-flat Clear Device pocket well healed; without hematoma or erythema.  There is no tethering  Regular rate and rhythm, no  murmur Abd-soft with active BS No Clubbing cyanosis  edema Skin-warm and dry A & Oriented  Grossly normal sensory and motor function  ECG AV pacing   Assessment and  Plan  Complete heart block-device dependent  The patient's device was interrogated.  The information was reviewed. No changes were made in the programming.     Pacemaker [employer]   Dysatuonomia  Psycho social stressors    dysautonimia is better  Mood is better  No impairment in exercise tolerance   Encourage remote followup  12/02/19

## 2019-10-18 ENCOUNTER — Other Ambulatory Visit: Payer: Self-pay

## 2019-10-18 DIAGNOSIS — Z20822 Contact with and (suspected) exposure to covid-19: Secondary | ICD-10-CM

## 2019-10-19 LAB — NOVEL CORONAVIRUS, NAA: SARS-CoV-2, NAA: NOT DETECTED

## 2019-12-02 ENCOUNTER — Ambulatory Visit (INDEPENDENT_AMBULATORY_CARE_PROVIDER_SITE_OTHER): Payer: Self-pay | Admitting: *Deleted

## 2019-12-02 DIAGNOSIS — I442 Atrioventricular block, complete: Secondary | ICD-10-CM

## 2019-12-03 LAB — CUP PACEART REMOTE DEVICE CHECK
Battery Remaining Longevity: 41 mo
Battery Voltage: 2.98 V
Brady Statistic AP VP Percent: 45.26 %
Brady Statistic AP VS Percent: 0.01 %
Brady Statistic AS VP Percent: 54.46 %
Brady Statistic AS VS Percent: 0.28 %
Brady Statistic RA Percent Paced: 45.15 %
Brady Statistic RV Percent Paced: 99.44 %
Date Time Interrogation Session: 20210121203603
Implantable Lead Implant Date: 20160606
Implantable Lead Implant Date: 20160606
Implantable Lead Location: 753859
Implantable Lead Location: 753860
Implantable Lead Model: 3830
Implantable Lead Model: 5076
Implantable Pulse Generator Implant Date: 20160606
Lead Channel Impedance Value: 285 Ohm
Lead Channel Impedance Value: 399 Ohm
Lead Channel Impedance Value: 399 Ohm
Lead Channel Impedance Value: 532 Ohm
Lead Channel Pacing Threshold Amplitude: 0.625 V
Lead Channel Pacing Threshold Amplitude: 1.125 V
Lead Channel Pacing Threshold Pulse Width: 0.4 ms
Lead Channel Pacing Threshold Pulse Width: 0.4 ms
Lead Channel Sensing Intrinsic Amplitude: 1.75 mV
Lead Channel Sensing Intrinsic Amplitude: 1.75 mV
Lead Channel Sensing Intrinsic Amplitude: 2.875 mV
Lead Channel Sensing Intrinsic Amplitude: 2.875 mV
Lead Channel Setting Pacing Amplitude: 2 V
Lead Channel Setting Pacing Amplitude: 2.5 V
Lead Channel Setting Pacing Pulse Width: 0.6 ms
Lead Channel Setting Sensing Sensitivity: 0.9 mV

## 2020-01-12 ENCOUNTER — Other Ambulatory Visit: Payer: Self-pay

## 2020-01-12 ENCOUNTER — Ambulatory Visit: Payer: Medicaid Other | Attending: Internal Medicine

## 2020-01-12 DIAGNOSIS — Z20822 Contact with and (suspected) exposure to covid-19: Secondary | ICD-10-CM

## 2020-01-13 LAB — NOVEL CORONAVIRUS, NAA: SARS-CoV-2, NAA: NOT DETECTED

## 2020-02-11 ENCOUNTER — Other Ambulatory Visit: Payer: Self-pay

## 2020-02-11 ENCOUNTER — Ambulatory Visit
Admission: EM | Admit: 2020-02-11 | Discharge: 2020-02-11 | Disposition: A | Discharge status: Home / Self Care | Payer: Medicaid Other | Attending: Emergency Medicine | Admitting: Emergency Medicine

## 2020-02-11 ENCOUNTER — Ambulatory Visit (INDEPENDENT_AMBULATORY_CARE_PROVIDER_SITE_OTHER): Payer: Self-pay

## 2020-02-11 DIAGNOSIS — R05 Cough: Secondary | ICD-10-CM

## 2020-02-11 DIAGNOSIS — R059 Cough, unspecified: Secondary | ICD-10-CM

## 2020-02-11 MED ORDER — ALBUTEROL SULFATE HFA 108 (90 BASE) MCG/ACT IN AERS: 2.0000 | INHALATION_SPRAY | RESPIRATORY_TRACT | 0 refills | Status: DC | PRN

## 2020-02-11 MED ORDER — PREDNISONE 20 MG PO TABS: 20.0000 mg | ORAL_TABLET | Freq: Every day | ORAL | 0 refills | Status: AC

## 2020-02-11 MED ORDER — AEROCHAMBER PLUS FLO-VU MEDIUM MISC: 1.0000 | Freq: Once | 0 refills | Status: AC

## 2020-02-11 MED ORDER — BENZONATATE 100 MG PO CAPS: 100.0000 mg | ORAL_CAPSULE | Freq: Three times a day (TID) | ORAL | 0 refills | Status: DC

## 2020-02-11 NOTE — ED Provider Notes (Signed)
[facility]    CSN: [id number] Arrival date & time: 02/11/20  [id number]      History   Chief Complaint Chief Complaint  Patient presents with  . Cough    HPI Laurie Phillips is a 29 y.o. 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  Patient states that she had a head cold with cough earlier in January.  Was able to treat this with OTC medications with complete resolution of upper respiratory symptoms, though cough has persisted.  Patient endorsing productive cough with clear sputum: No hemoptysis.  Patient felt her breathing has slightly worsened from baseline for the last month, though noticeably since last week.  Patient denying chest pain, palpitations, lightheadedness.  Denying increased labored breathing: States there is more dyspnea with exertion and when laying down.  Patient denying lower extremity edema, nausea, abdominal pain.  No fever or known sick contacts.   Past Medical History:  Diagnosis Date  . AVNRT (AV nodal re-entry tachycardia) ([place])   . Bipolar 1 disorder ([place])   . CHB (complete heart block) ([facility])    s/p Dual chamber: Atrial and HIS pacemaker placement  . Complication of anesthesia    itchy after pacemaker placed  . Depression    doesn't take meds  . Dyspnea    sitting/exertion/lying  . Family history of adverse reaction to anesthesia    adopted  . Headache   . Nocturia   . 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 Pneumonia   . Pott's disease   . Sinus bradycardia   . Spinal headache   . Urinary frequency   . Weakness    numbness and tingling in hands    Patient Active Problem List   Diagnosis Date Noted  . 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 Pain in the chest   . Complete heart block ([place]) 11/27/2015  . 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 Status post primary low transverse cesarean section 03/08/2015  . Pre-eclampsia   . Chest pain 10/14/2014  . Bipolar  disorder ([place]) 10/14/2014  . Generalized anxiety disorder 10/14/2014  . Rh negative, antepartum 10/14/2014  . Fibroid x 2, small 10/14/2014  . ADHD (attention deficit hyperactivity disorder) 10/14/2014    Past Surgical History:  Procedure Laterality Date  . CESAREAN SECTION N/A 03/08/2015   Procedure: CESAREAN SECTION;  Surgeon: Everett Graff, MD;  Location: [facility];  Service: Obstetrics;  Laterality: N/A;  . GANGLION CYST EXCISION Left 11/09/2016   Procedure: LEFT WRIST VOLAR CARPAL GANGLION EXCISION;  Surgeon: Iran Planas, MD;  Location: [facility];  Service: Orthopedics;  Laterality: Left;  . KNEE ARTHROSCOPY Left   . PACEMAKER PLACEMENT    . TONSILLECTOMY      OB History    Gravida  2   Para  1   Term  0   Preterm  1   AB  1   Living  1     SAB  1   TAB  0   Ectopic  0   Multiple  0   Live Births  1            Home Medications    Prior to Admission medications   Medication Sig Start Date End Date Taking? Authorizing Provider  FLUoxetine (PROZAC) 10 MG capsule Take 10 mg by mouth daily.   Yes [provider]  ibuprofen (ADVIL,MOTRIN) 400 MG tablet Take 1 tablet (400 mg total) by mouth  every 6 (six) hours as needed. 03/19/17  Yes Davonna Belling, MD  Multiple Vitamin (MULTIVITAMIN WITH MINERALS) TABS tablet Take 1 tablet by mouth once a week.   Yes [provider]  albuterol (VENTOLIN HFA) 108 (90 Base) MCG/ACT inhaler Inhale 2 puffs into the lungs every 4 (four) hours as needed for wheezing or shortness of breath. 02/11/20   Hall-Potvin, [place], PA-C  benzonatate (TESSALON) 100 MG capsule Take 1 capsule (100 mg total) by mouth every 8 (eight) hours. 02/11/20   Hall-Potvin, [place], PA-C  predniSONE (DELTASONE) 20 MG tablet Take 1 tablet (20 mg total) by mouth daily for 7 days. 02/11/20 02/18/20  Hall-Potvin, [place], PA-C  Spacer/Aero-Holding Chambers (AEROCHAMBER PLUS FLO-VU MEDIUM) MISC 1 each by Other route once for 1 dose. 02/11/20 02/11/20   Hall-Potvin, [place], PA-C    Family History Family History  Adopted: Yes  Problem Relation Age of Onset  . Healthy Mother   . Healthy Father     Social History Social History   Tobacco Use  . Smoking status: Former Research scientist (life sciences)  . Smokeless tobacco: Never Used  . Tobacco comment: quit smoking a couple of yrs ago  Substance Use Topics  . Alcohol use: Yes    Alcohol/week: 0.0 standard drinks    Comment: glass or 2 at dinner  . Drug use: No     Allergies   Patient has no known allergies.   Review of Systems As per HPI   Physical Exam Triage Vital Signs ED Triage Vitals  Enc Vitals Group     BP      Pulse      Resp      Temp      Temp src      SpO2      Weight      Height      Head Circumference      Peak Flow      Pain Score      Pain Loc      Pain Edu?      Excl. in [place]?    No data found.  Updated Vital Signs BP 113/71   Pulse 64   Temp 98.3 F (36.8 C)   Resp 18   LMP 01/28/2020   SpO2 97%   Visual Acuity Right Eye Distance:   Left Eye Distance:   Bilateral Distance:    Right Eye Near:   Left Eye Near:    Bilateral Near:     Physical Exam Constitutional:      General: She is not in acute distress.    Appearance: She is not ill-appearing or diaphoretic.  HENT:     Head: Normocephalic and atraumatic.     Right Ear: Tympanic membrane, ear canal and external ear normal.     Left Ear: Tympanic membrane, ear canal and external ear normal.     Nose: Nose normal.     Mouth/Throat:     Mouth: Mucous membranes are moist.     Pharynx: Oropharynx is clear. No oropharyngeal exudate or posterior oropharyngeal erythema.  Eyes:     General: No scleral icterus.    Extraocular Movements: Extraocular movements intact.     Conjunctiva/sclera: Conjunctivae normal.     Pupils: Pupils are equal, round, and reactive to light.  Neck:     Comments: Trachea midline, negative JVD Cardiovascular:     Rate and Rhythm: Normal rate and regular rhythm.     Heart  sounds: No murmur. No gallop.   Pulmonary:  Effort: Pulmonary effort is normal. No respiratory distress.     Breath sounds: Wheezing and rales present. No rhonchi.     Comments: Adventitia in upper lobes bilaterally Musculoskeletal:     Cervical back: Neck supple. No tenderness.     Right lower leg: No edema.     Left lower leg: No edema.  Lymphadenopathy:     Cervical: No cervical adenopathy.  Skin:    Capillary Refill: Capillary refill takes less than 2 seconds.     Coloration: Skin is not jaundiced or pale.     Findings: No rash.  Neurological:     General: No focal deficit present.     Mental Status: She is alert and oriented to person, place, and time.      UC Treatments / Results  Labs (all labs ordered are listed, but only abnormal results are displayed) Labs Reviewed  NOVEL CORONAVIRUS, NAA    EKG   Radiology DG Chest 2 View  Result Date: 02/11/2020 CLINICAL DATA:  29 year old female with a history of cough EXAM: CHEST - 2 VIEW COMPARISON:  Multiple prior most recent 08/19/2018 FINDINGS: Cardiomediastinal silhouette unchanged in size and contour. Cardiac pacing device on the left chest wall with 2 leads in place, unchanged. No pneumothorax.  No pleural effusion or confluent airspace disease. No displaced fracture IMPRESSION: No active cardiopulmonary disease. Electronically Signed   By: Corrie Mckusick D.O.   On: 02/11/2020 09:00    Procedures Procedures (including critical care time)  Medications Ordered in UC Medications - No data to display  Initial Impression / Assessment and Plan / UC Course  I have reviewed the triage vital signs and the nursing notes.  Pertinent labs & imaging results that were available during my care of the patient were reviewed by me and considered in my medical decision making (see chart for details).     Patient afebrile, nontoxic, with SpO2 97%.  No respiratory distress or signs of fluid overload.  Given weeklong course of cough  with increased dyspnea and patient's cardiac history, Covid PCR pending.  Patient to quarantine until results are back.  Chest x-ray done in office, reviewed by me and radiology and compared to previous from 08/19/2018: Unchanged size and contour of cardiomediastinal silhouette.  Pacing device in chest wall, left side with 2 leads in place: No change.  No pneumothorax, effusion, or infiltrate.  Negative for active cardiopulmonary disease.  Reviewed findings with patient verbalized understanding.  Will treat supportively for bronchitis, have patient keep cardiologist in the loop for further work-up should symptoms persist or worsen.  Return precautions discussed, patient verbalized understanding and is agreeable to plan. Final Clinical Impressions(s) / UC Diagnoses   Final diagnoses:  Cough     Discharge Instructions     Tessalon for cough. Start flonase, atrovent nasal spray for nasal congestion/drainage. You can use over the counter nasal saline rinse such as neti pot for nasal congestion. Keep hydrated, your urine should be clear to pale yellow in color. Tylenol/motrin for fever and pain. Monitor for any worsening of symptoms, chest pain, shortness of breath, wheezing, swelling of the throat, go to the emergency department for further evaluation needed.     ED Prescriptions    Medication Sig Dispense Auth. Provider   benzonatate (TESSALON) 100 MG capsule Take 1 capsule (100 mg total) by mouth every 8 (eight) hours. 21 capsule Hall-Potvin, [place], PA-C   predniSONE (DELTASONE) 20 MG tablet Take 1 tablet (20 mg total) by mouth daily for  7 days. 7 tablet Hall-Potvin, [place], PA-C   albuterol (VENTOLIN HFA) 108 (90 Base) MCG/ACT inhaler Inhale 2 puffs into the lungs every 4 (four) hours as needed for wheezing or shortness of breath. 18 g Hall-Potvin, [place], PA-C   Spacer/Aero-Holding Chambers (AEROCHAMBER PLUS FLO-VU MEDIUM) MISC 1 each by Other route once for 1 dose. 1 each Hall-Potvin,  [place], PA-C     PDMP not reviewed this encounter.   Neldon Mc Oldham, [place] 02/11/20 [phone]

## 2020-02-11 NOTE — Discharge Instructions (Addendum)

## 2020-02-11 NOTE — ED Triage Notes (Signed)
Pt presents with complaints of cold symptoms and cough since January. States that now she has worsening shortness of breath that gets worse when she lays down. Pt has history of heart block with a pacemaker present due to pregnancy complications x 5 years ago. Reports pain with cough.

## 2020-02-13 LAB — SARS-COV-2, NAA 2 DAY TAT

## 2020-02-13 LAB — NOVEL CORONAVIRUS, NAA: SARS-CoV-2, NAA: NOT DETECTED

## 2020-03-02 ENCOUNTER — Ambulatory Visit (INDEPENDENT_AMBULATORY_CARE_PROVIDER_SITE_OTHER): Payer: Self-pay | Admitting: *Deleted

## 2020-03-02 DIAGNOSIS — I442 Atrioventricular block, complete: Secondary | ICD-10-CM

## 2020-03-06 LAB — CUP PACEART REMOTE DEVICE CHECK
Battery Remaining Longevity: 38 mo
Battery Voltage: 2.97 V
Brady Statistic AP VP Percent: 42.56 %
Brady Statistic AP VS Percent: 0.01 %
Brady Statistic AS VP Percent: 57.2 %
Brady Statistic AS VS Percent: 0.23 %
Brady Statistic RA Percent Paced: 42.47 %
Brady Statistic RV Percent Paced: 99.47 %
Date Time Interrogation Session: 20210424093355
Implantable Lead Implant Date: 20160606
Implantable Lead Implant Date: 20160606
Implantable Lead Location: 753859
Implantable Lead Location: 753860
Implantable Lead Model: 3830
Implantable Lead Model: 5076
Implantable Pulse Generator Implant Date: 20160606
Lead Channel Impedance Value: 266 Ohm
Lead Channel Impedance Value: 380 Ohm
Lead Channel Impedance Value: 380 Ohm
Lead Channel Impedance Value: 532 Ohm
Lead Channel Pacing Threshold Amplitude: 0.625 V
Lead Channel Pacing Threshold Amplitude: 1.125 V
Lead Channel Pacing Threshold Pulse Width: 0.4 ms
Lead Channel Pacing Threshold Pulse Width: 0.4 ms
Lead Channel Sensing Intrinsic Amplitude: 2 mV
Lead Channel Sensing Intrinsic Amplitude: 2 mV
Lead Channel Sensing Intrinsic Amplitude: 3.25 mV
Lead Channel Sensing Intrinsic Amplitude: 3.25 mV
Lead Channel Setting Pacing Amplitude: 2 V
Lead Channel Setting Pacing Amplitude: 2.5 V
Lead Channel Setting Pacing Pulse Width: 0.6 ms
Lead Channel Setting Sensing Sensitivity: 0.9 mV

## 2020-03-06 NOTE — Progress Notes (Signed)
PPM Remote  

## 2020-03-09 ENCOUNTER — Encounter (HOSPITAL_COMMUNITY): Payer: Self-pay

## 2020-03-09 ENCOUNTER — Other Ambulatory Visit: Payer: Self-pay

## 2020-03-09 ENCOUNTER — Emergency Department (HOSPITAL_COMMUNITY)
Admission: EM | Admit: 2020-03-09 | Discharge: 2020-03-09 | Disposition: A | Discharge status: Home / Self Care | Payer: Medicaid Other | Attending: Emergency Medicine | Admitting: Emergency Medicine

## 2020-03-09 DIAGNOSIS — S31815A Open bite of right buttock, initial encounter: Secondary | ICD-10-CM | POA: Insufficient documentation

## 2020-03-09 DIAGNOSIS — M791 Myalgia, unspecified site: Secondary | ICD-10-CM | POA: Insufficient documentation

## 2020-03-09 DIAGNOSIS — Z950 Presence of cardiac pacemaker: Secondary | ICD-10-CM | POA: Insufficient documentation

## 2020-03-09 DIAGNOSIS — Y999 Unspecified external cause status: Secondary | ICD-10-CM | POA: Insufficient documentation

## 2020-03-09 DIAGNOSIS — Y939 Activity, unspecified: Secondary | ICD-10-CM | POA: Insufficient documentation

## 2020-03-09 DIAGNOSIS — Z79899 Other long term (current) drug therapy: Secondary | ICD-10-CM | POA: Insufficient documentation

## 2020-03-09 DIAGNOSIS — W540XXA Bitten by dog, initial encounter: Secondary | ICD-10-CM | POA: Insufficient documentation

## 2020-03-09 DIAGNOSIS — Y929 Unspecified place or not applicable: Secondary | ICD-10-CM | POA: Insufficient documentation

## 2020-03-09 DIAGNOSIS — Z96652 Presence of left artificial knee joint: Secondary | ICD-10-CM | POA: Insufficient documentation

## 2020-03-09 DIAGNOSIS — M549 Dorsalgia, unspecified: Secondary | ICD-10-CM | POA: Insufficient documentation

## 2020-03-09 DIAGNOSIS — Z23 Encounter for immunization: Secondary | ICD-10-CM | POA: Insufficient documentation

## 2020-03-09 DIAGNOSIS — Z87891 Personal history of nicotine dependence: Secondary | ICD-10-CM | POA: Insufficient documentation

## 2020-03-09 MED ORDER — TETANUS-DIPHTH-ACELL PERTUSSIS 5-2.5-18.5 LF-MCG/0.5 IM SUSP
0.5000 mL | Freq: Once | INTRAMUSCULAR | Status: AC
  Administered 2020-03-09: 0.5 mL via INTRAMUSCULAR
  Filled 2020-03-09: qty 0.5

## 2020-03-09 MED ORDER — TETANUS IMMUNE GLOBULIN 250 UNIT/ML IM INJ
250.0000 [IU] | INJECTION | Freq: Once | INTRAMUSCULAR | Status: AC
  Administered 2020-03-09: 250 [IU] via INTRAMUSCULAR
  Filled 2020-03-09: qty 1

## 2020-03-09 NOTE — Discharge Instructions (Addendum)
Return for fevers, confusion, muscle stiffness, worsening or new symptoms, neck stiffness, spreading redness from your wound or new concerns. Tylenol for pain.

## 2020-03-09 NOTE — ED Triage Notes (Signed)
Pt reports was bit by a dog last Friday on r buttock.  Pt says area doesn't look infected but realized her tdap wasn't up to date.  Pt says now c/o pain in neck and hips.  Reports a few weeks ago she had bronchitis and says has noticed her wheezing has came back intermittently.

## 2020-03-09 NOTE — ED Provider Notes (Addendum)
[facility] EMERGENCY DEPARTMENT Provider Note   CSN: [id number] Arrival date & time: 03/09/20  0945     History Chief Complaint  Patient presents with  . Animal Bite    Laurie Phillips is a 29 y.o. female.  Patient with heart block and pacemaker, bipolar presents for assessment of dog bite that happened on Friday.  It was her dog and she is certain that the vaccines are up-to-date.  Patient was concerned as her tetanus is not up-to-date.  Patient has mild tenderness and bruising to the right buttocks but no concern for infection at this time.  No fevers, chills or vomiting.  Patient has had muscle aches however also had the Covid shot approximately 2 weeks ago.        Past Medical History:  Diagnosis Date  . AVNRT (AV nodal re-entry tachycardia) ([place])   . Bipolar 1 disorder ([place])   . CHB (complete heart block) ([facility])    s/p Dual chamber: Atrial and HIS pacemaker placement  . Complication of anesthesia    itchy after pacemaker placed  . Depression    doesn't take meds  . Dyspnea    sitting/exertion/lying  . Family history of adverse reaction to anesthesia    adopted  . Headache   . Nocturia   . 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 Pneumonia   . Pott's disease   . Sinus bradycardia   . Spinal headache   . Urinary frequency   . Weakness    numbness and tingling in hands    Patient Active Problem List   Diagnosis Date Noted  . 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 Pain in the chest   . Complete heart block ([place]) 11/27/2015  . 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 Status post primary low transverse cesarean section 03/08/2015  . Pre-eclampsia   . Chest pain 10/14/2014  . Bipolar disorder ([place]) 10/14/2014  . Generalized anxiety disorder 10/14/2014  . Rh negative, antepartum 10/14/2014  . Fibroid x 2, small 10/14/2014  . ADHD (attention deficit hyperactivity disorder) 10/14/2014    Past Surgical History:  Procedure Laterality Date  . CESAREAN SECTION N/A 03/08/2015   Procedure: CESAREAN SECTION;  Surgeon: Everett Graff, MD;  Location: [facility];  Service: Obstetrics;  Laterality: N/A;  . GANGLION CYST EXCISION Left 11/09/2016   Procedure: LEFT WRIST VOLAR CARPAL GANGLION EXCISION;  Surgeon: Iran Planas, MD;  Location: [facility];  Service: Orthopedics;  Laterality: Left;  . KNEE ARTHROSCOPY Left   . PACEMAKER PLACEMENT    . TONSILLECTOMY       OB History    Gravida  2   Para  1   Term  0   Preterm  1   AB  1   Living  1     SAB  1   TAB  0   Ectopic  0   Multiple  0   Live Births  1           Family History  Adopted: Yes  Problem Relation Age of Onset  . Healthy Mother   . Healthy Father     Social History   Tobacco Use  . Smoking status: Former Research scientist (life sciences)  . Smokeless tobacco: Never Used  . Tobacco comment: quit smoking a couple of yrs ago  Substance Use Topics  . Alcohol use: Yes    Alcohol/week: 0.0 standard drinks    Comment: glass or 2 at dinner  . Drug use: No    Home Medications Prior to Admission medications  Medication Sig Start Date End Date Taking? Authorizing Provider  albuterol (VENTOLIN HFA) 108 (90 Base) MCG/ACT inhaler Inhale 2 puffs into the lungs every 4 (four) hours as needed for wheezing or shortness of breath. 02/11/20   Hall-Potvin, [place], PA-C  benzonatate (TESSALON) 100 MG capsule Take 1 capsule (100 mg total) by mouth every 8 (eight) hours. 02/11/20   Hall-Potvin, [place], PA-C  FLUoxetine (PROZAC) 10 MG capsule Take 10 mg by mouth daily.    [provider]  ibuprofen (ADVIL,MOTRIN) 400 MG tablet Take 1 tablet (400 mg total) by mouth every 6 (six) hours as needed. 03/19/17   Davonna Belling, MD  Multiple Vitamin (MULTIVITAMIN WITH MINERALS) TABS tablet Take 1 tablet by mouth once a week.    [provider]    Allergies    Patient has no known allergies.  Review of Systems   Review of Systems  Constitutional: Negative for chills and fever.  HENT: Negative for congestion.     Eyes: Negative for visual disturbance.  Respiratory: Negative for shortness of breath.   Cardiovascular: Negative for chest pain.  Gastrointestinal: Negative for abdominal pain and vomiting.  Genitourinary: Negative for dysuria and flank pain.  Musculoskeletal: Positive for arthralgias. Negative for neck pain and neck stiffness.  Skin: Positive for wound. Negative for rash.  Neurological: Negative for light-headedness and headaches.    Physical Exam Updated Vital Signs BP (!) 142/81 (BP Location: Right Arm)   Pulse 64   Temp 98.6 F (37 C) (Oral)   Resp 14   Ht 5\' 2"  (1.575 m)   Wt 76.2 kg   SpO2 97%   BMI 30.73 kg/m   Physical Exam Vitals and nursing note reviewed.  Constitutional:      Appearance: She is well-developed. She is not ill-appearing.  HENT:     Head: Normocephalic and atraumatic.  Eyes:     General:        Right eye: No discharge.        Left eye: No discharge.     Conjunctiva/sclera: Conjunctivae normal.  Neck:     Trachea: No tracheal deviation.  Cardiovascular:     Rate and Rhythm: Normal rate.  Pulmonary:     Effort: Pulmonary effort is normal.     Breath sounds: Normal breath sounds.  Abdominal:     General: There is no distension.     Palpations: Abdomen is soft.     Tenderness: There is no abdominal tenderness. There is no guarding.  Musculoskeletal:     Cervical back: Normal range of motion and neck supple. No rigidity or tenderness.     Comments: Patient has mild achy sensation on palpation paraspinal thoracic.  No midline cervical thoracic or lumbar tenderness.  No meningismus full range of motion head neck.  Patient has approximate 6 cm area of ecchymosis right buttocks with no induration, drainage or spreading warmth or redness.  Skin:    General: Skin is warm.     Findings: Bruising present.  Neurological:     Mental Status: She is alert and oriented to person, place, and time.  Psychiatric:        Mood and Affect: Mood normal.      ED Results / Procedures / Treatments   Labs (all labs ordered are listed, but only abnormal results are displayed) Labs Reviewed - No data to display  EKG None  Radiology No results found.  Procedures Procedures (including critical care time)  Medications Ordered in ED Medications  tetanus  immune globulin (HYPERTET) injection 250 Units (has no administration in time range)  Tdap (BOOSTRIX) injection 0.5 mL (0.5 mLs Intramuscular Given 03/09/20 1137)    ED Course  I have reviewed the triage vital signs and the nursing notes.  Pertinent labs & imaging results that were available during my care of the patient were reviewed by me and considered in my medical decision making (see chart for details).    MDM Rules/Calculators/A&P                      Patient presents for assessment of dog bite.  It appears clinically to be healing well bruising noted.  No signs of active infection at this time.  Patient does have mild muscle aches, the dog's vaccines are up-to-date.  Discussed reassessment in 2 to 3 days if no improvement and strict reasons to return.  We will hold on antibiotics at this time. With muscle tightness 3 days after bite and patient not being up-to-date on her vaccines plan to give both tetanus vaccine and immunoglobulin.  Discussed with pharmacy to clarify and dosing appropriate.    Final Clinical Impression(s) / ED Diagnoses Final diagnoses:  Dog bite, initial encounter    Rx / DC Orders ED Discharge Orders    None       Elnora Morrison, MD 03/09/20 1147    Elnora Morrison, MD 03/09/20 1147    Elnora Morrison, MD 03/09/20 1156

## 2020-07-10 ENCOUNTER — Telehealth: Payer: Self-pay | Admitting: *Deleted

## 2020-07-10 ENCOUNTER — Ambulatory Visit (INDEPENDENT_AMBULATORY_CARE_PROVIDER_SITE_OTHER): Payer: Self-pay | Admitting: *Deleted

## 2020-07-10 DIAGNOSIS — I442 Atrioventricular block, complete: Secondary | ICD-10-CM

## 2020-07-10 LAB — CUP PACEART REMOTE DEVICE CHECK
Battery Remaining Longevity: 33 mo
Battery Voltage: 2.96 V
Brady Statistic AP VP Percent: 39.61 %
Brady Statistic AP VS Percent: 0.03 %
Brady Statistic AS VP Percent: 60.15 %
Brady Statistic AS VS Percent: 0.21 %
Brady Statistic RA Percent Paced: 39.56 %
Brady Statistic RV Percent Paced: 99.5 %
Date Time Interrogation Session: 20210829185745
Implantable Lead Implant Date: 20160606
Implantable Lead Implant Date: 20160606
Implantable Lead Location: 753859
Implantable Lead Location: 753860
Implantable Lead Model: 3830
Implantable Lead Model: 5076
Implantable Pulse Generator Implant Date: 20160606
Lead Channel Impedance Value: 247 Ohm
Lead Channel Impedance Value: 361 Ohm
Lead Channel Impedance Value: 399 Ohm
Lead Channel Impedance Value: 532 Ohm
Lead Channel Pacing Threshold Amplitude: 0.625 V
Lead Channel Pacing Threshold Amplitude: 1.125 V
Lead Channel Pacing Threshold Pulse Width: 0.4 ms
Lead Channel Pacing Threshold Pulse Width: 0.4 ms
Lead Channel Sensing Intrinsic Amplitude: 1.75 mV
Lead Channel Sensing Intrinsic Amplitude: 1.75 mV
Lead Channel Sensing Intrinsic Amplitude: 2.375 mV
Lead Channel Sensing Intrinsic Amplitude: 2.375 mV
Lead Channel Setting Pacing Amplitude: 2 V
Lead Channel Setting Pacing Amplitude: 2.5 V
Lead Channel Setting Pacing Pulse Width: 0.6 ms
Lead Channel Setting Sensing Sensitivity: 0.9 mV

## 2020-07-10 NOTE — Telephone Encounter (Signed)
Scheduled PPM transmission from 07/09/20 reveals 3 VHR episodes, cannot rule out VT 180s w/ retrograde conduction based on available EGMs, duration unknown. 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 Denies other cardiac symptoms and unable to correlate these episodes with symptoms. Not on cardiac meds. LV EF 55-60% as of 11/2015. Advised will route to Dr. Caryl Comes for review (sent via result note). Pt in agreement with plan.   Scheduled for video visit with Dr. Caryl Comes on 09/07/20 at 2:15pm per recall.

## 2020-07-12 NOTE — Progress Notes (Signed)
Remote pacemaker transmission.   

## 2020-07-18 ENCOUNTER — Other Ambulatory Visit: Payer: Medicaid Other

## 2020-07-18 ENCOUNTER — Other Ambulatory Visit: Payer: Self-pay

## 2020-07-18 DIAGNOSIS — Z20822 Contact with and (suspected) exposure to covid-19: Secondary | ICD-10-CM

## 2020-07-20 ENCOUNTER — Other Ambulatory Visit: Payer: Medicaid Other

## 2020-07-20 LAB — SARS-COV-2, NAA 2 DAY TAT

## 2020-07-20 LAB — NOVEL CORONAVIRUS, NAA: SARS-CoV-2, NAA: DETECTED — AB

## 2020-07-21 ENCOUNTER — Telehealth: Payer: Self-pay | Admitting: Infectious Diseases

## 2020-07-21 NOTE — Telephone Encounter (Signed)
Called to Discuss with patient about Covid symptoms and the use of the monoclonal antibody infusion for those with mild to moderate Covid symptoms and at a high risk of hospitalization.     Pt appears to qualify for this infusion due to co-morbid conditions and/or a member of an at-risk group in accordance with the FDA Emergency Use Authorization.    Unable to reach pt  LVM and MyChart sent

## 2020-08-28 ENCOUNTER — Other Ambulatory Visit: Payer: Self-pay

## 2020-08-28 ENCOUNTER — Encounter: Payer: Self-pay | Admitting: Emergency Medicine

## 2020-08-28 ENCOUNTER — Ambulatory Visit
Admission: EM | Admit: 2020-08-28 | Discharge: 2020-08-28 | Disposition: A | Discharge status: Home / Self Care | Payer: Medicaid Other | Attending: Emergency Medicine | Admitting: Emergency Medicine

## 2020-08-28 DIAGNOSIS — J209 Acute bronchitis, unspecified: Secondary | ICD-10-CM

## 2020-08-28 MED ORDER — PREDNISONE 10 MG PO TABS: 20.0000 mg | ORAL_TABLET | Freq: Every day | ORAL | 0 refills | Status: DC

## 2020-08-28 MED ORDER — BENZONATATE 100 MG PO CAPS: 100.0000 mg | ORAL_CAPSULE | Freq: Three times a day (TID) | ORAL | 0 refills | Status: DC

## 2020-08-28 MED ORDER — AZITHROMYCIN 250 MG PO TABS: 250.0000 mg | ORAL_TABLET | Freq: Every day | ORAL | 0 refills | Status: DC

## 2020-08-28 NOTE — ED Triage Notes (Signed)
Patient states that she had covid in september- still having some cough, rt lower abdomen pain.

## 2020-08-28 NOTE — ED Provider Notes (Signed)
[facility]   [id number] 08/28/20 Arrival Time: 0804   Chief Complaint  Patient presents with  . Cough     SUBJECTIVE: History from: patient.  Laurie Phillips is a 29 y.o. female who presented to the urgent care for complaint of cough that has been ongoing since September.  Reportedly recently developed clear mucus discharge with a cough.  Reportedly had Covid in September 2021.  Denies recent travel.  Has tried OTC medication without relief.  Denies relieving factors.  Denies previous symptoms in the past.   Denies fever, chills, fatigue, sinus pain, rhinorrhea, sore throat, SOB, wheezing, chest pain, nausea, changes in bowel or bladder habits.     ROS: As per HPI.  All other pertinent ROS negative.     Past Medical History:  Diagnosis Date  . AVNRT (AV nodal re-entry tachycardia) ([place])   . Bipolar 1 disorder ([place])   . CHB (complete heart block) ([facility])    s/p Dual chamber: Atrial and HIS pacemaker placement  . Complication of anesthesia    itchy after pacemaker placed  . Depression    doesn't take meds  . Dyspnea    sitting/exertion/lying  . Family history of adverse reaction to anesthesia    adopted  . Headache   . Nocturia   . 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 Pneumonia   . Pott's disease   . Sinus bradycardia   . Spinal headache   . Urinary frequency   . Weakness    numbness and tingling in hands   Past Surgical History:  Procedure Laterality Date  . CESAREAN SECTION N/A 03/08/2015   Procedure: CESAREAN SECTION;  Surgeon: Everett Graff, MD;  Location: [facility];  Service: Obstetrics;  Laterality: N/A;  . GANGLION CYST EXCISION Left 11/09/2016   Procedure: LEFT WRIST VOLAR CARPAL GANGLION EXCISION;  Surgeon: Iran Planas, MD;  Location: [facility];  Service: Orthopedics;  Laterality: Left;  . KNEE ARTHROSCOPY Left   . PACEMAKER PLACEMENT    . TONSILLECTOMY     No Known Allergies No current facility-administered medications on file prior to encounter.    Current Outpatient Medications on File Prior to Encounter  Medication Sig Dispense Refill  . albuterol (VENTOLIN HFA) 108 (90 Base) MCG/ACT inhaler Inhale 2 puffs into the lungs every 4 (four) hours as needed for wheezing or shortness of breath. 18 g 0  . FLUoxetine (PROZAC) 10 MG capsule Take 10 mg by mouth daily.    [name] ibuprofen (ADVIL,MOTRIN) 400 MG tablet Take 1 tablet (400 mg total) by mouth every 6 (six) hours as needed. 8 tablet 0  . Multiple Vitamin (MULTIVITAMIN WITH MINERALS) TABS tablet Take 1 tablet by mouth once a week.     Social History   Socioeconomic History  . Marital status: Married    Spouse name: Not on file  . Number of children: Not on file  . Years of education: Not on file  . Highest education level: Not on file  Occupational History  . Not on file  Tobacco Use  . Smoking status: Former Research scientist (life sciences)  . Smokeless tobacco: Never Used  . Tobacco comment: quit smoking a couple of yrs ago  Substance and Sexual Activity  . Alcohol use: Yes    Alcohol/week: 0.0 standard drinks    Comment: glass or 2 at dinner  . Drug use: No  . Sexual activity: Yes    Birth control/protection: None  Other Topics Concern  . Not on file  Social History Narrative  . Not  on file   Social Determinants of Health   Financial Resource Strain:   . Difficulty of Paying Living Expenses: Not on file  Food Insecurity:   . Worried About Charity fundraiser in the Last Year: Not on file  . Ran Out of Food in the Last Year: Not on file  Transportation Needs:   . Lack of Transportation (Medical): Not on file  . Lack of Transportation (Non-Medical): Not on file  Physical Activity:   . Days of Exercise per Week: Not on file  . Minutes of Exercise per Session: Not on file  Stress:   . Feeling of Stress : Not on file  Social Connections:   . Frequency of Communication with Friends and Family: Not on file  . Frequency of Social Gatherings with Friends and Family: Not on file  . Attends  Religious Services: Not on file  . Active Member of Clubs or Organizations: Not on file  . Attends Archivist Meetings: Not on file  . Marital Status: Not on file  Intimate Partner Violence:   . Fear of Current or Ex-Partner: Not on file  . Emotionally Abused: Not on file  . Physically Abused: Not on file  . Sexually Abused: Not on file   Family History  Adopted: Yes  Problem Relation Age of Onset  . Healthy Mother   . Healthy Father     OBJECTIVE:  Vitals:   08/28/20 0807  BP: 119/73  Pulse: 82  Resp: 16  Temp: 97.9 F (36.6 C)  SpO2: 98%     General appearance: alert; appears fatigued, but nontoxic; speaking in full sentences and tolerating own secretions HEENT: NCAT; Ears: EACs clear, TMs pearly gray; Eyes: PERRL.  EOM grossly intact. Sinuses: nontender; Nose: nares patent without rhinorrhea, Throat: oropharynx clear, tonsils non erythematous or enlarged, uvula midline  Neck: supple without LAD Lungs: unlabored respirations, symmetrical air entry; cough: moderate; no respiratory distress; CTAB Heart: regular rate and rhythm.  Radial pulses 2+ symmetrical bilaterally Skin: warm and dry Psychological: alert and cooperative; normal mood and affect  LABS:  No results found for this or any previous visit (from the past 24 hour(s)).   ASSESSMENT & PLAN:  1. Acute bronchitis, unspecified organism     Meds ordered this encounter  Medications  . azithromycin (ZITHROMAX) 250 MG tablet    Sig: Take 1 tablet (250 mg total) by mouth daily. Take first 2 tablets together, then 1 every day until finished.    Dispense:  6 tablet    Refill:  0  . predniSONE (DELTASONE) 10 MG tablet    Sig: Take 2 tablets (20 mg total) by mouth daily.    Dispense:  15 tablet    Refill:  0  . benzonatate (TESSALON) 100 MG capsule    Sig: Take 1 capsule (100 mg total) by mouth every 8 (eight) hours.    Dispense:  30 capsule    Refill:  0    Discharge instructions  Get plenty of  rest and push fluids Tessalon Perles prescribed for cough Azithromycin was prescribed Prednisone was prescribed/take as directed Use medications daily for symptom relief Use OTC medications like ibuprofen or tylenol as needed fever or pain Call or go to the ED if you have any new or worsening symptoms such as fever, worsening cough, shortness of breath, chest tightness, chest pain, turning blue, changes in mental status, etc...   Reviewed expectations re: course of current medical issues. Questions answered. Outlined signs  and symptoms indicating need for more acute intervention. Patient verbalized understanding. After Visit Summary given.        Emerson Monte, [place] 08/28/20 [phone]

## 2020-08-28 NOTE — Discharge Instructions (Addendum)
Get plenty of rest and push fluids Tessalon Perles prescribed for cough Azithromycin was prescribed Prednisone was prescribed/take as directed Use medications daily for symptom relief Use OTC medications like ibuprofen or tylenol as needed fever or pain Call or go to the ED if you have any new or worsening symptoms such as fever, worsening cough, shortness of breath, chest tightness, chest pain, turning blue, changes in mental status, etc..[name]

## 2020-09-06 NOTE — Progress Notes (Signed)
Electrophysiology TeleHealth Note   Due to national recommendations of social distancing due to COVID 19, an audio/video telehealth visit is felt to be most appropriate for this patient at this time.  See MyChart message from today for the patient's consent to telehealth for [facility].   Date:  09/07/2020   ID:  Laurie Phillips, DOB Jul 10, 1991, MRN [id number]  Location: patient's home  Provider location: [address] [place]  Evaluation Performed: Follow-up visit  PCP:  Briscoe Deutscher, MD  Cardiologist:    Electrophysiologist:  SK   Chief Complaint:    History of Present Illness:    Laurie Phillips is a 29 y.o. female who presents via audio/video conferencing for a telehealth visit today.  Since last being seen in our clinic, the patient reports Doing well Good energy, rare dypsnea--dizziness queiscient   -- volume responsive  Mood is great  Developed breakthrough COVID but with little untwoard x loss of taste and smell    Now back to baseline   Husband also sick     The patient denies symptoms of fevers, chills, cough, or new SOB worrisome for COVID 19.   Past Medical History:  Diagnosis Date  . AVNRT (AV nodal re-entry tachycardia) ([place])   . Bipolar 1 disorder ([place])   . CHB (complete heart block) ([facility])    s/p Dual chamber: Atrial and HIS pacemaker placement  . Complication of anesthesia    itchy after pacemaker placed  . Depression    doesn't take meds  . Dyspnea    sitting/exertion/lying  . Family history of adverse reaction to anesthesia    adopted  . Headache   . Nocturia   . 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 Pneumonia   . Pott's disease   . Sinus bradycardia   . Spinal headache   . Urinary frequency   . Weakness    numbness and tingling in hands    Past Surgical History:  Procedure Laterality Date  . CESAREAN SECTION N/A 03/08/2015   Procedure: CESAREAN SECTION;  Surgeon: Everett Graff, MD;  Location: [facility];  Service:  Obstetrics;  Laterality: N/A;  . GANGLION CYST EXCISION Left 11/09/2016   Procedure: LEFT WRIST VOLAR CARPAL GANGLION EXCISION;  Surgeon: Iran Planas, MD;  Location: [facility];  Service: Orthopedics;  Laterality: Left;  . KNEE ARTHROSCOPY Left   . PACEMAKER PLACEMENT    . TONSILLECTOMY      Current Outpatient Medications  Medication Sig Dispense Refill  . albuterol (VENTOLIN HFA) 108 (90 Base) MCG/ACT inhaler Inhale 2 puffs into the lungs every 4 (four) hours as needed for wheezing or shortness of breath. 18 g 0  . FLUoxetine (PROZAC) 10 MG capsule Take 10 mg by mouth daily.    [name] ibuprofen (ADVIL,MOTRIN) 400 MG tablet Take 1 tablet (400 mg total) by mouth every 6 (six) hours as needed. 8 tablet 0  . Multiple Vitamin (MULTIVITAMIN WITH MINERALS) TABS tablet Take 1 tablet by mouth once a week.    [name] azithromycin (ZITHROMAX) 250 MG tablet Take 1 tablet (250 mg total) by mouth daily. Take first 2 tablets together, then 1 every day until finished. (Patient not taking: Reported on 09/07/2020) 6 tablet 0  . benzonatate (TESSALON) 100 MG capsule Take 1 capsule (100 mg total) by mouth every 8 (eight) hours. (Patient not taking: Reported on 09/07/2020) 30 capsule 0  . predniSONE (DELTASONE) 10 MG tablet Take 2 tablets (20 mg total) by mouth daily. (Patient not taking: Reported  on 09/07/2020) 15 tablet 0   No current facility-administered medications for this visit.    Allergies:   Patient has no known allergies.   Social History:  The patient  reports that she has quit smoking. She has never used smokeless tobacco. She reports current alcohol use. She reports that she does not use drugs.   Family History:  The patient's   family history includes Healthy in her father and mother. She was adopted.   ROS:  Please see the history of present illness.   All other systems are personally reviewed and negative.    Exam:    Vital Signs:  Ht 5\' 2"  (1.575 m)   Wt 165 lb (74.8 kg)   BMI 30.18 kg/m     Well  appearing, alert and conversant, regular work of breathing,  good skin color Eyes- anicteric, neuro- grossly intact, skin- no apparent rash or lesions or cyanosis, mouth- oral mucosa is pink   Labs/Other Tests and Data Reviewed:    Recent Labs: No results found for requested labs within last 8760 hours.   Wt Readings from Last 3 Encounters:  09/07/20 165 lb (74.8 kg)  03/09/20 168 lb (76.2 kg)  09/02/19 167 lb 6.4 oz (75.9 kg)     Other studies personally reviewed: Additional studies/ records that were reviewed today include:    Last device remote is reviewed from [facility] PDF dated 8/21 which reveals normal device function,   arrhythmias - tachycardia   ASSESSMENT & PLAN:   Complete heart block-device dependent        Pacemaker [employer]onic   Dysatuonomia  Psycho social stressors   Overall doing well   Wants to skydive  Really ???  Will need to revew   COVID 19 screen The patient denies symptoms of COVID 19 at this time.  The importance of social distancing was discussed today.  Follow-up:  22 m APP Next remote: As Scheduled   Current medicines are reviewed at length with the patient today.   The patient does not have concerns regarding her medicines.  The following changes were made today:  none  Labs/ tests ordered today include:   No orders of the defined types were placed in this encounter.      Patient Risk:  after full review of this patients clinical status, I feel that they are at moderate risk at this time.  Today, I have spent 9 minutes with the patient with telehealth technology discussing the above.  Signed, Virl Axe, MD  09/07/2020 2:24 PM     [facility] [address] [place] [address] [phone] (office) [phone] (fax)

## 2020-09-07 ENCOUNTER — Telehealth: Payer: Self-pay

## 2020-09-07 ENCOUNTER — Telehealth (INDEPENDENT_AMBULATORY_CARE_PROVIDER_SITE_OTHER): Payer: Self-pay | Admitting: Internal Medicine

## 2020-09-07 VITALS — Ht 62.0 in | Wt 165.0 lb

## 2020-09-07 DIAGNOSIS — G901 Familial dysautonomia [Riley-Day]: Secondary | ICD-10-CM

## 2020-09-07 DIAGNOSIS — I442 Atrioventricular block, complete: Secondary | ICD-10-CM

## 2020-09-07 DIAGNOSIS — Z950 Presence of cardiac pacemaker: Secondary | ICD-10-CM

## 2020-09-07 NOTE — Patient Instructions (Signed)
Medication Instructions:  ?Your physician recommends that you continue on your current medications as directed. Please refer to the Current Medication list given to you today. ? ?*If you need a refill on your cardiac medications before your next appointment, please call your pharmacy* ? ? ?Lab Work: ?None ordered. ? ?If you have labs (blood work) drawn today and your tests are completely normal, you will receive your results only by: ?MyChart Message (if you have MyChart) OR ?A paper copy in the mail ?If you have any lab test that is abnormal or we need to change your treatment, we will call you to review the results. ? ? ?Testing/Procedures: ?None ordered. ? ? ? ?Follow-Up: ?At [facility], you and your health needs are our priority.  As part of our continuing mission to provide you with exceptional heart care, we have created designated Provider Care Teams.  These Care Teams include your primary Cardiologist (physician) and Advanced Practice Providers (APPs -  Physician Assistants and Nurse Practitioners) who all work together to provide you with the care you need, when you need it. ? ?We recommend signing up for the patient portal called "MyChart".  Sign up information is provided on this After Visit Summary.  MyChart is used to connect with patients for Virtual Visits (Telemedicine).  Patients are able to view lab/test results, encounter notes, upcoming appointments, etc.  Non-urgent messages can be sent to your provider as well.   ?To learn more about what you can do with MyChart, go to [email or web address].   ? ?Your next appointment:   ?12 months with Dr Klein's PA ?

## 2020-09-07 NOTE — Telephone Encounter (Signed)
  Patient Consent for Virtual Visit         Laurie Phillips has provided verbal consent on 09/07/2020 for a virtual visit (video or telephone).   CONSENT FOR VIRTUAL VISIT FOR:  Laurie Phillips  By participating in this virtual visit I agree to the following:  I hereby voluntarily request, consent and authorize [facility] and its employed or contracted physicians, physician assistants, nurse practitioners or other licensed health care professionals (the Practitioner), to provide me with telemedicine health care services (the "Services") as deemed necessary by the treating Practitioner. I acknowledge and consent to receive the Services by the Practitioner via telemedicine. I understand that the telemedicine visit will involve communicating with the Practitioner through live audiovisual communication technology and the disclosure of certain medical information by electronic transmission. I acknowledge that I have been given the opportunity to request an in-person assessment or other available alternative prior to the telemedicine visit and am voluntarily participating in the telemedicine visit.  I understand that I have the right to withhold or withdraw my consent to the use of telemedicine in the course of my care at any time, without affecting my right to future care or treatment, and that the Practitioner or I may terminate the telemedicine visit at any time. I understand that I have the right to inspect all information obtained and/or recorded in the course of the telemedicine visit and may receive copies of available information for a reasonable fee.  I understand that some of the potential risks of receiving the Services via telemedicine include:  [name] Delay or interruption in medical evaluation due to technological equipment failure or disruption; . Information transmitted may not be sufficient (e.g. poor resolution of images) to allow for appropriate medical decision making by the Practitioner;  and/or  . In rare instances, security protocols could fail, causing a breach of personal health information.  Furthermore, I acknowledge that it is my responsibility to provide information about my medical history, conditions and care that is complete and accurate to the best of my ability. I acknowledge that Practitioner's advice, recommendations, and/or decision may be based on factors not within their control, such as incomplete or inaccurate data provided by me or distortions of diagnostic images or specimens that may result from electronic transmissions. I understand that the practice of medicine is not an exact science and that Practitioner makes no warranties or guarantees regarding treatment outcomes. I acknowledge that a copy of this consent can be made available to me via my patient portal ([facility]), or I can request a printed copy by calling the office of [facility].    I understand that my insurance will be billed for this visit.   I have read or had this consent read to me. . I understand the contents of this consent, which adequately explains the benefits and risks of the Services being provided via telemedicine.  . I have been provided ample opportunity to ask questions regarding this consent and the Services and have had my questions answered to my satisfaction. . I give my informed consent for the services to be provided through the use of telemedicine in my medical care

## 2020-10-01 ENCOUNTER — Emergency Department (HOSPITAL_COMMUNITY)
Admission: EM | Admit: 2020-10-01 | Discharge: 2020-10-01 | Disposition: A | Discharge status: Home / Self Care | Payer: Medicaid Other | Attending: Emergency Medicine | Admitting: Emergency Medicine

## 2020-10-01 ENCOUNTER — Other Ambulatory Visit: Payer: Self-pay

## 2020-10-01 ENCOUNTER — Encounter (HOSPITAL_COMMUNITY): Payer: Self-pay | Admitting: Emergency Medicine

## 2020-10-01 DIAGNOSIS — S0501XA Injury of conjunctiva and corneal abrasion without foreign body, right eye, initial encounter: Secondary | ICD-10-CM

## 2020-10-01 DIAGNOSIS — Z955 Presence of coronary angioplasty implant and graft: Secondary | ICD-10-CM | POA: Insufficient documentation

## 2020-10-01 DIAGNOSIS — X58XXXA Exposure to other specified factors, initial encounter: Secondary | ICD-10-CM | POA: Insufficient documentation

## 2020-10-01 MED ORDER — TETRACAINE HCL 0.5 % OP SOLN
1.0000 [drp] | Freq: Once | OPHTHALMIC | Status: AC
  Administered 2020-10-01: 1 [drp] via OPHTHALMIC
  Filled 2020-10-01: qty 4

## 2020-10-01 MED ORDER — TOBRAMYCIN 0.3 % OP SOLN: 1.0000 [drp] | OPHTHALMIC | 0 refills | Status: AC

## 2020-10-01 NOTE — ED Notes (Signed)
Ice pack applied to right eye.  Swelling noted.

## 2020-10-01 NOTE — ED Triage Notes (Signed)
Pt c/o of rt eye pain that started today at 1645. Pt's eye is swollen shut.

## 2020-10-01 NOTE — ED Provider Notes (Signed)
[facility] EMERGENCY DEPARTMENT Provider Note   CSN: [id number] Arrival date & time: 10/01/20  1647     History Chief Complaint  Patient presents with  . Eye Pain    Laurie Phillips is a 29 y.o. female.  Pt reports she felt like she had something in her eye.  Pt reports she rubbed her eye, now more pain and swollen   The history is provided by the patient. No language interpreter was used.  Eye Pain This is a new problem. The current episode started 3 to 5 hours ago. The problem occurs constantly. The problem has been gradually worsening. Nothing aggravates the symptoms. Nothing relieves the symptoms. She has tried nothing for the symptoms. The treatment provided no relief.       Past Medical History:  Diagnosis Date  . AVNRT (AV nodal re-entry tachycardia) ([place])   . Bipolar 1 disorder ([place])   . CHB (complete heart block) ([facility])    s/p Dual chamber: Atrial and HIS pacemaker placement  . Complication of anesthesia    itchy after pacemaker placed  . Depression    doesn't take meds  . Dyspnea    sitting/exertion/lying  . Family history of adverse reaction to anesthesia    adopted  . Headache   . Nocturia   . 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 Pneumonia   . Pott's disease   . Sinus bradycardia   . Spinal headache   . Urinary frequency   . Weakness    numbness and tingling in hands    Patient Active Problem List   Diagnosis Date Noted  . Dysautonomia ([place]) 12/05/2015  . Pain in the chest   . Complete heart block ([employer]) 11/27/2015  . 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 Status post primary low transverse cesarean section 03/08/2015  . Pre-eclampsia   . Chest pain 10/14/2014  . Bipolar disorder ([place]) 10/14/2014  . Generalized anxiety disorder 10/14/2014  . Rh negative, antepartum 10/14/2014  . Fibroid x 2, small 10/14/2014  . ADHD (attention deficit hyperactivity disorder) 10/14/2014    Past Surgical History:  Procedure Laterality Date  . CESAREAN SECTION N/A  03/08/2015   Procedure: CESAREAN SECTION;  Surgeon: Everett Graff, MD;  Location: [facility];  Service: Obstetrics;  Laterality: N/A;  . GANGLION CYST EXCISION Left 11/09/2016   Procedure: LEFT WRIST VOLAR CARPAL GANGLION EXCISION;  Surgeon: Iran Planas, MD;  Location: [facility];  Service: Orthopedics;  Laterality: Left;  . KNEE ARTHROSCOPY Left   . PACEMAKER PLACEMENT    . TONSILLECTOMY       OB History    Gravida  2   Para  1   Term  0   Preterm  1   AB  1   Living  1     SAB  1   TAB  0   Ectopic  0   Multiple  0   Live Births  1           Family History  Adopted: Yes  Problem Relation Age of Onset  . Healthy Mother   . Healthy Father     Social History   Tobacco Use  . Smoking status: Former Research scientist (life sciences)  . Smokeless tobacco: Never Used  . Tobacco comment: quit smoking a couple of yrs ago  Substance Use Topics  . Alcohol use: Yes    Alcohol/week: 0.0 standard drinks    Comment: glass or 2 at dinner  . Drug use: No    Home Medications Prior to  Admission medications   Medication Sig Start Date End Date Taking? Authorizing Provider  albuterol (VENTOLIN HFA) 108 (90 Base) MCG/ACT inhaler Inhale 2 puffs into the lungs every 4 (four) hours as needed for wheezing or shortness of breath. 02/11/20   Hall-Potvin, [place], PA-C  FLUoxetine (PROZAC) 10 MG capsule Take 10 mg by mouth daily.    [provider]  ibuprofen (ADVIL,MOTRIN) 400 MG tablet Take 1 tablet (400 mg total) by mouth every 6 (six) hours as needed. 03/19/17   Davonna Belling, MD  Multiple Vitamin (MULTIVITAMIN WITH MINERALS) TABS tablet Take 1 tablet by mouth once a week.    [provider]  tobramycin (TOBREX) 0.3 % ophthalmic solution Place 1 drop into the right eye every 4 (four) hours for 10 days. 10/01/20 10/11/20  Fransico Meadow, PA-C    Allergies    Patient has no known allergies.  Review of Systems   Review of Systems  Eyes: Positive for pain.  All other systems reviewed  and are negative.   Physical Exam Updated Vital Signs BP 124/87 (BP Location: Right Arm)   Pulse 100   Temp 98.2 F (36.8 C) (Oral)   Resp 18   Ht 5\' 2"  (1.575 m)   Wt 77.1 kg   LMP 08/31/2020 (Approximate)   SpO2 98%   BMI 31.09 kg/m   Physical Exam Vitals and nursing note reviewed.  Constitutional:      Appearance: She is well-developed.  HENT:     Head: Normocephalic.     Nose: Nose normal.  Eyes:     Extraocular Movements: Extraocular movements intact.     Pupils: Pupils are equal, round, and reactive to light.     Comments: Swelling right eyelids, chemosis, injected,  fluro multiple area of uptake lid and lower globe,  Moving area removed with a q tip.   Pulmonary:     Effort: Pulmonary effort is normal.  Abdominal:     General: There is no distension.  Musculoskeletal:        General: Normal range of motion.     Cervical back: Normal range of motion.  Neurological:     Mental Status: She is alert and oriented to person, place, and time.  Psychiatric:        Mood and Affect: Mood normal.     ED Results / Procedures / Treatments   Labs (all labs ordered are listed, but only abnormal results are displayed) Labs Reviewed - No data to display  EKG None  Radiology No results found.  Procedures Procedures (including critical care time)  Medications Ordered in ED Medications  tetracaine (PONTOCAINE) 0.5 % ophthalmic solution 1 drop (1 drop Right Eye Given 10/01/20 1751)    ED Course  I have reviewed the triage vital signs and the nursing notes.  Pertinent labs & imaging results that were available during my care of the patient were reviewed by me and considered in my medical decision making (see chart for details).    MDM Rules/Calculators/A&P                          MDM:  Final Clinical Impression(s) / ED Diagnoses Final diagnoses:  Abrasion of right cornea, initial encounter    Rx / DC Orders ED Discharge Orders         Ordered     tobramycin (TOBREX) 0.3 % ophthalmic solution  Every 4 hours        10/01/20 1805  An After Visit Summary was printed and given to the patient.    Fransico Meadow, PA-C 10/01/20 1825    Fredia Sorrow, MD 10/17/20 [phone]

## 2020-10-01 NOTE — Discharge Instructions (Addendum)
Return if any problems. Continue ice pack.  Benadryl or zyrtec tonight.  Return if any problems;

## 2020-10-09 ENCOUNTER — Ambulatory Visit (INDEPENDENT_AMBULATORY_CARE_PROVIDER_SITE_OTHER): Payer: Self-pay

## 2020-10-09 DIAGNOSIS — I442 Atrioventricular block, complete: Secondary | ICD-10-CM

## 2020-10-11 LAB — CUP PACEART REMOTE DEVICE CHECK
Battery Remaining Longevity: 30 mo
Battery Voltage: 2.96 V
Brady Statistic AP VP Percent: 38.14 %
Brady Statistic AP VS Percent: 0.07 %
Brady Statistic AS VP Percent: 61.61 %
Brady Statistic AS VS Percent: 0.19 %
Brady Statistic RA Percent Paced: 38.14 %
Brady Statistic RV Percent Paced: 99.4 %
Date Time Interrogation Session: 20211130211209
Implantable Lead Implant Date: 20160606
Implantable Lead Implant Date: 20160606
Implantable Lead Location: 753859
Implantable Lead Location: 753860
Implantable Lead Model: 3830
Implantable Lead Model: 5076
Implantable Pulse Generator Implant Date: 20160606
Lead Channel Impedance Value: 266 Ohm
Lead Channel Impedance Value: 380 Ohm
Lead Channel Impedance Value: 399 Ohm
Lead Channel Impedance Value: 551 Ohm
Lead Channel Pacing Threshold Amplitude: 0.625 V
Lead Channel Pacing Threshold Amplitude: 1.25 V
Lead Channel Pacing Threshold Pulse Width: 0.4 ms
Lead Channel Pacing Threshold Pulse Width: 0.4 ms
Lead Channel Sensing Intrinsic Amplitude: 2.625 mV
Lead Channel Sensing Intrinsic Amplitude: 2.625 mV
Lead Channel Sensing Intrinsic Amplitude: 2.875 mV
Lead Channel Sensing Intrinsic Amplitude: 2.875 mV
Lead Channel Setting Pacing Amplitude: 2 V
Lead Channel Setting Pacing Amplitude: 2.5 V
Lead Channel Setting Pacing Pulse Width: 0.6 ms
Lead Channel Setting Sensing Sensitivity: 0.9 mV

## 2020-10-12 ENCOUNTER — Telehealth: Payer: Self-pay

## 2020-10-12 NOTE — Telephone Encounter (Signed)
Schedule remote received 10/10/20 for 27 VHR's, appears AVNRT. Longest in duration 5 minutes 22 seconds. Patient at this time was in the ED for evaluation d/t abrasion to eye. Patient has hx of POT's. Reports she was not aware of elevated rates. Patient denies chest pain, shortness of breath or dizziness. Reports she has experienced occasional episodes of dual/sharp pain in left rib area, worsen with inspiration/expiration. States other than this, she has no complaints. Compliant with all medications.  Advised patient I will route to Dr. Caryl Comes and we will call with any changes. Patient agreeable to plan. Aware to call if symptoms arise.

## 2020-10-13 NOTE — Progress Notes (Signed)
Remote pacemaker transmission.   

## 2020-11-29 ENCOUNTER — Telehealth: Payer: Self-pay | Admitting: Internal Medicine

## 2020-11-29 NOTE — Telephone Encounter (Signed)
Will send to device clinic  

## 2020-11-29 NOTE — Telephone Encounter (Signed)
Pt called in and stated she is getting some lip filler at [facility] and they are needed something faxed over to them stating that this is ok since she has a pacemaker  She did not have the fax number.  She is going to call back with fax

## 2020-11-29 NOTE — Telephone Encounter (Signed)
   Primary Cardiologist: Dr. Caryl Comes  Chart reviewed as part of pre-operative protocol coverage. Given past medical history and time since last visit, based on ACC/AHA guidelines, Laurie Phillips would be at acceptable risk for the planned procedure without further cardiovascular testing. Lip filler is considered low risk procedure.   She has a permanent pacemaker which was placed in 2017 due to dysautonomia and complete heart block. Most recent clinic visit 08/2020. Most recent device check 10/10/20 was unremarkable.   Will route to device clinic for any additional recommendations.   I will route this recommendation to the requesting party via Epic fax function and send update again with any specific recommendations from device clinic.    Loel Dubonnet, NP 11/29/2020, 7:01 PM

## 2020-11-29 NOTE — Telephone Encounter (Signed)
Spoke with pt, provided fax number for device clinic to have procedure MD send request for clearance to.

## 2020-11-29 NOTE — Telephone Encounter (Signed)
   [facility] Pre-operative Risk Assessment    HEARTCARE STAFF: - Please ensure there is not already an duplicate clearance open for this procedure. - Under Visit Info/Reason for Call, type in Other and utilize the format Clearance MM/DD/YY or Clearance TBD. Do not use dashes or single digits. - If request is for dental extraction, please clarify the # of teeth to be extracted.  Request for surgical clearance:  1. What type of surgery is being performed? Lip filler   2. When is this surgery scheduled? 11/30/2020 at 8:00 am  3. What type of clearance is required (medical clearance vs. Pharmacy clearance to hold med vs. Both)? Medical   4. Are there any medications that need to be held prior to surgery and how long? No   5. Practice name and name of physician performing surgery? [facility], Halina Maidens FNP  6. What is the office phone number? [phone]   7.   What is the office fax number? [phone]  8.   Anesthesia type (None, local, MAC, general) ? no   Selena Zobro 11/29/2020, 4:45 PM  _________________________________________________________________   (provider comments below)

## 2020-11-30 NOTE — Telephone Encounter (Signed)
Spoke with Luvenia Starch at [facility] and provider will contact device clinic with details of procedure for recommendations concerning pacemaker during procedure.

## 2020-11-30 NOTE — Telephone Encounter (Signed)
Sharyon Cable NP reports procedure involves injection , no cautery or use of any other equipment. No pacemaker recommendations required for procedure.

## 2021-01-08 ENCOUNTER — Ambulatory Visit (INDEPENDENT_AMBULATORY_CARE_PROVIDER_SITE_OTHER): Payer: Self-pay

## 2021-01-08 DIAGNOSIS — I442 Atrioventricular block, complete: Secondary | ICD-10-CM

## 2021-01-10 LAB — CUP PACEART REMOTE DEVICE CHECK
Battery Remaining Longevity: 28 mo
Battery Voltage: 2.95 V
Brady Statistic AP VP Percent: 36.6 %
Brady Statistic AP VS Percent: 0.02 %
Brady Statistic AS VP Percent: 63.23 %
Brady Statistic AS VS Percent: 0.15 %
Brady Statistic RA Percent Paced: 36.58 %
Brady Statistic RV Percent Paced: 99.54 %
Date Time Interrogation Session: 20220301205425
Implantable Lead Implant Date: 20160606
Implantable Lead Implant Date: 20160606
Implantable Lead Location: 753859
Implantable Lead Location: 753860
Implantable Lead Model: 3830
Implantable Lead Model: 5076
Implantable Pulse Generator Implant Date: 20160606
Lead Channel Impedance Value: 247 Ohm
Lead Channel Impedance Value: 380 Ohm
Lead Channel Impedance Value: 399 Ohm
Lead Channel Impedance Value: 532 Ohm
Lead Channel Pacing Threshold Amplitude: 0.625 V
Lead Channel Pacing Threshold Amplitude: 1.125 V
Lead Channel Pacing Threshold Pulse Width: 0.4 ms
Lead Channel Pacing Threshold Pulse Width: 0.4 ms
Lead Channel Sensing Intrinsic Amplitude: 2.25 mV
Lead Channel Sensing Intrinsic Amplitude: 2.25 mV
Lead Channel Sensing Intrinsic Amplitude: 2.5 mV
Lead Channel Sensing Intrinsic Amplitude: 2.5 mV
Lead Channel Setting Pacing Amplitude: 2 V
Lead Channel Setting Pacing Amplitude: 2.5 V
Lead Channel Setting Pacing Pulse Width: 0.6 ms
Lead Channel Setting Sensing Sensitivity: 0.9 mV

## 2021-01-15 NOTE — Progress Notes (Signed)
Remote pacemaker transmission.   

## 2021-03-28 ENCOUNTER — Encounter (HOSPITAL_COMMUNITY): Payer: Self-pay | Admitting: Emergency Medicine

## 2021-03-28 ENCOUNTER — Emergency Department (HOSPITAL_COMMUNITY)
Admission: EM | Admit: 2021-03-28 | Discharge: 2021-03-28 | Disposition: A | Discharge status: Home / Self Care | Payer: Self-pay | Attending: Emergency Medicine | Admitting: Emergency Medicine

## 2021-03-28 ENCOUNTER — Other Ambulatory Visit: Payer: Self-pay

## 2021-03-28 ENCOUNTER — Emergency Department (HOSPITAL_COMMUNITY): Payer: Self-pay

## 2021-03-28 DIAGNOSIS — Z950 Presence of cardiac pacemaker: Secondary | ICD-10-CM | POA: Insufficient documentation

## 2021-03-28 DIAGNOSIS — Z20822 Contact with and (suspected) exposure to covid-19: Secondary | ICD-10-CM | POA: Insufficient documentation

## 2021-03-28 DIAGNOSIS — J40 Bronchitis, not specified as acute or chronic: Secondary | ICD-10-CM | POA: Insufficient documentation

## 2021-03-28 DIAGNOSIS — Z87891 Personal history of nicotine dependence: Secondary | ICD-10-CM | POA: Insufficient documentation

## 2021-03-28 LAB — BASIC METABOLIC PANEL
Anion gap: 11 (ref 5–15)
BUN: 8 mg/dL (ref 6–20)
CO2: 21 mmol/L — ABNORMAL LOW (ref 22–32)
Calcium: 9.1 mg/dL (ref 8.9–10.3)
Chloride: 104 mmol/L (ref 98–111)
Creatinine, Ser: 0.79 mg/dL (ref 0.44–1.00)
GFR, Estimated: >60 mL/min (ref >60)
Glucose, Bld: 115 mg/dL — ABNORMAL HIGH (ref 70–99)
Potassium: 4.8 mmol/L (ref 3.5–5.1)
Sodium: 136 mmol/L (ref 135–145)

## 2021-03-28 LAB — CBC WITH DIFFERENTIAL/PLATELET
Abs Immature Granulocytes: 0.03 10*3/uL (ref 0.00–0.07)
Basophils Absolute: 0 10*3/uL (ref 0.0–0.1)
Basophils Relative: 0 %
Eosinophils Absolute: 0.4 10*3/uL (ref 0.0–0.5)
Eosinophils Relative: 5 %
HCT: 42.9 % (ref 36.0–46.0)
Hemoglobin: 14.5 g/dL (ref 12.0–15.0)
Immature Granulocytes: 0 %
Lymphocytes Relative: 14 %
Lymphs Abs: 1.2 10*3/uL (ref 0.7–4.0)
MCH: 31 pg (ref 26.0–34.0)
MCHC: 33.8 g/dL (ref 30.0–36.0)
MCV: 91.9 fL (ref 80.0–100.0)
Monocytes Absolute: 1.1 10*3/uL — ABNORMAL HIGH (ref 0.1–1.0)
Monocytes Relative: 13 %
Neutro Abs: 5.9 10*3/uL (ref 1.7–7.7)
Neutrophils Relative %: 68 %
Platelets: 283 10*3/uL (ref 150–400)
RBC: 4.67 MIL/uL (ref 3.87–5.11)
RDW: 13 % (ref 11.5–15.5)
WBC: 8.6 10*3/uL (ref 4.0–10.5)
nRBC: 0 % (ref 0.0–0.2)

## 2021-03-28 LAB — I-STAT BETA HCG BLOOD, ED (MC, WL, AP ONLY): I-stat hCG, quantitative: <5 m[IU]/mL (ref <5)

## 2021-03-28 LAB — RESP PANEL BY RT-PCR (FLU A&B, COVID) ARPGX2
Influenza A by PCR: NEGATIVE
Influenza B by PCR: NEGATIVE
SARS Coronavirus 2 by RT PCR: NEGATIVE

## 2021-03-28 LAB — TROPONIN I (HIGH SENSITIVITY)
Troponin I (High Sensitivity): <2 ng/L (ref <18)
Troponin I (High Sensitivity): <2 ng/L (ref <18)

## 2021-03-28 MED ORDER — ALBUTEROL SULFATE HFA 108 (90 BASE) MCG/ACT IN AERS
2.0000 | INHALATION_SPRAY | RESPIRATORY_TRACT | Status: DC | PRN
  Administered 2021-03-28: 2 via RESPIRATORY_TRACT
  Filled 2021-03-28: qty 6.7

## 2021-03-28 MED ORDER — PREDNISONE 10 MG (21) PO TBPK: ORAL_TABLET | ORAL | 0 refills | Status: DC

## 2021-03-28 NOTE — ED Provider Notes (Signed)
[facility] EMERGENCY DEPARTMENT Provider Note  CSN: [id number] Arrival date & time: 03/28/21 0735    History Chief Complaint  Patient presents with  . Chest Pain    HPI  Laurie Phillips is a 30 y.o. female with history of complete heart block s/p PPM placement about 6 years ago reports she has had cough, productive of clear sputum with sore throat and SOB with chest soreness for 2days. No fevers. She reports her abdominal muscles are sore from coughing. Pain is worse with deep breath/coughing. Not improved with motrin at home.    Past Medical History:  Diagnosis Date  . AVNRT (AV nodal re-entry tachycardia) ([place])   . Bipolar 1 disorder ([place])   . CHB (complete heart block) ([facility])    s/p Dual chamber: Atrial and HIS pacemaker placement  . Complication of anesthesia    itchy after pacemaker placed  . Depression    doesn't take meds  . Dyspnea    sitting/exertion/lying  . Family history of adverse reaction to anesthesia    adopted  . Headache   . Nocturia   . 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 Pneumonia   . Pott's disease   . Sinus bradycardia   . Spinal headache   . Urinary frequency   . Weakness    numbness and tingling in hands    Past Surgical History:  Procedure Laterality Date  . CESAREAN SECTION N/A 03/08/2015   Procedure: CESAREAN SECTION;  Surgeon: Everett Graff, MD;  Location: [facility];  Service: Obstetrics;  Laterality: N/A;  . GANGLION CYST EXCISION Left 11/09/2016   Procedure: LEFT WRIST VOLAR CARPAL GANGLION EXCISION;  Surgeon: Iran Planas, MD;  Location: [place];  Service: Orthopedics;  Laterality: Left;  . KNEE ARTHROSCOPY Left   . PACEMAKER PLACEMENT    . TONSILLECTOMY      Family History  Adopted: Yes  Problem Relation Age of Onset  . Healthy Mother   . Healthy Father     Social History   Tobacco Use  . Smoking status: Former Research scientist (life sciences)  . Smokeless tobacco: Never Used  . Tobacco comment: quit smoking a couple of yrs ago  Substance Use Topics   . Alcohol use: Yes    Alcohol/week: 0.0 standard drinks    Comment: glass or 2 at dinner  . Drug use: No     Home Medications Prior to Admission medications   Medication Sig Start Date End Date Taking? Authorizing Provider  FLUoxetine (PROZAC) 10 MG capsule Take 10 mg by mouth daily.   Yes [provider]  Multiple Vitamin (MULTIVITAMIN WITH MINERALS) TABS tablet Take 1 tablet by mouth once a week.   Yes [provider]  predniSONE (STERAPRED UNI-PAK 21 TAB) 10 MG (21) TBPK tablet 10mg  Tabs, 6 day taper. Use as directed 03/28/21  Yes Truddie Hidden, MD  albuterol (VENTOLIN HFA) 108 (90 Base) MCG/ACT inhaler Inhale 2 puffs into the lungs every 4 (four) hours as needed for wheezing or shortness of breath. Patient not taking: Reported on 03/28/2021 02/11/20   Hall-Potvin, [place], PA-C  ibuprofen (ADVIL,MOTRIN) 400 MG tablet Take 1 tablet (400 mg total) by mouth every 6 (six) hours as needed. Patient not taking: Reported on 03/28/2021 03/19/17   Davonna Belling, MD     Allergies    Patient has no known allergies.   Review of Systems   Review of Systems A comprehensive review of systems was completed and negative except as noted in HPI.    Physical Exam  BP 97/87   Pulse 79   Temp 98.4 F (36.9 C) (Oral)   Resp 15   Ht 5\' 2"  (1.575 m)   Wt 77.1 kg   LMP 03/21/2021   SpO2 97%   BMI 31.09 kg/m   Physical Exam Vitals and nursing note reviewed.  Constitutional:      Appearance: Normal appearance.  HENT:     Head: Normocephalic and atraumatic.     Nose: Nose normal.     Mouth/Throat:     Mouth: Mucous membranes are moist.  Eyes:     Extraocular Movements: Extraocular movements intact.     Conjunctiva/sclera: Conjunctivae normal.  Cardiovascular:     Rate and Rhythm: Normal rate.  Pulmonary:     Effort: Pulmonary effort is normal.     Breath sounds: Wheezing and rhonchi present.  Chest:     Chest wall: Tenderness present.  Abdominal:     General:  Abdomen is flat.     Palpations: Abdomen is soft.     Tenderness: There is no abdominal tenderness.  Musculoskeletal:        General: No swelling. Normal range of motion.     Cervical back: Neck supple.     Right lower leg: No edema.     Left lower leg: No edema.  Skin:    General: Skin is warm and dry.  Neurological:     General: No focal deficit present.     Mental Status: She is alert.  Psychiatric:        Mood and Affect: Mood normal.      ED Results / Procedures / Treatments   Labs (all labs ordered are listed, but only abnormal results are displayed) Labs Reviewed  BASIC METABOLIC PANEL - Abnormal; Notable for the following components:      Result Value   CO2 21 (*)    Glucose, Bld 115 (*)    All other components within normal limits  CBC WITH DIFFERENTIAL/PLATELET - Abnormal; Notable for the following components:   Monocytes Absolute 1.1 (*)    All other components within normal limits  RESP PANEL BY RT-PCR (FLU A&B, COVID) ARPGX2  I-STAT BETA HCG BLOOD, ED ([facility], [facility], AP ONLY)  TROPONIN I (HIGH SENSITIVITY)  TROPONIN I (HIGH SENSITIVITY)    EKG EKG Interpretation  Date/Time:  Wednesday Mar 28 2021 07:46:03 EDT Ventricular Rate:  89 PR Interval:  220 QRS Duration: 125 QT Interval:  377 QTC Calculation: 459 R Axis:   77 Text Interpretation: Atrial-sensed ventricular-paced rhythm Prolonged PR interval Biatrial enlargement Nonspecific intraventricular conduction delay Borderline T abnormalities, inferior leads No significant change since last tracing Confirmed by Calvert Cantor [phone]) on 03/28/2021 7:51:40 AM   Radiology DG Chest Port 1 View  Result Date: 03/28/2021 CLINICAL DATA:  30 year old female with 2 days of cough, left chest pain. EXAM: PORTABLE CHEST 1 VIEW COMPARISON:  Chest radiographs 02/11/2020 and earlier. FINDINGS: Portable AP upright view at 0834 hours. Stable left chest dual lead pacemaker. Lung volumes and mediastinal contours remain normal.  Visualized tracheal air column is within normal limits. No pneumothorax. Allowing for portable technique the lungs are clear. No osseous abnormality identified. IMPRESSION: No acute cardiopulmonary abnormality.  Chronic left chest pacemaker. Electronically Signed   By: Genevie Ann M.D.   On: 03/28/2021 08:44    Procedures Procedures  Medications Ordered in the ED Medications - No data to display   MDM Rules/Calculators/A&P MDM Patient with pleuritic chest pain, cough and sore throat. Will check  labs and CXR. ACS and PE felt to be unlikely. Albuterol HFA for wheezing. Check covid and flu as well.  ED Course  I have reviewed the triage vital signs and the nursing notes.  Pertinent labs & imaging results that were available during my care of the patient were reviewed by me and considered in my medical decision making (see chart for details).  Clinical Course as of 03/28/21 1703  Wed Mar 28, 2021  0856 CBC is normal. HCG is neg.  [CS]  5102 CXR without signs of edema or infiltrate.  [CS]  0916 BMP and Trop #1 are neg.  [CS]  5852 Covid is negative.  [CS]  1107 Covid and Second Trop are neg.  [CS]  7782 Patient reports some improvement in cough with Albuterol. Plan discharge home with Rx for steroid taper, continue albuterol as needed and PCP follow up. [CS]    Clinical Course User Index [CS] Truddie Hidden, MD    Final Clinical Impression(s) / ED Diagnoses Final diagnoses:  Bronchitis    Rx / DC Orders ED Discharge Orders         Ordered    predniSONE (STERAPRED UNI-PAK 21 TAB) 10 MG (21) TBPK tablet        03/28/21 1122           Truddie Hidden, MD 03/28/21 1704

## 2021-03-28 NOTE — ED Triage Notes (Signed)
C/o of cough that started 2 days ago without fever, left sided chest pain that remains constant and radiates to below left breast. Hx bradycardia with pacemaker in place. Increased pain on inspiration

## 2021-04-23 ENCOUNTER — Ambulatory Visit (INDEPENDENT_AMBULATORY_CARE_PROVIDER_SITE_OTHER): Payer: Self-pay

## 2021-04-23 DIAGNOSIS — I442 Atrioventricular block, complete: Secondary | ICD-10-CM

## 2021-04-23 LAB — CUP PACEART REMOTE DEVICE CHECK
Battery Remaining Longevity: 26 mo
Battery Voltage: 2.94 V
Brady Statistic AP VP Percent: 36.57 %
Brady Statistic AP VS Percent: 0.03 %
Brady Statistic AS VP Percent: 63.07 %
Brady Statistic AS VS Percent: 0.33 %
Brady Statistic RA Percent Paced: 36.5 %
Brady Statistic RV Percent Paced: 99.28 %
Date Time Interrogation Session: 20220611114259
Implantable Lead Implant Date: 20160606
Implantable Lead Implant Date: 20160606
Implantable Lead Location: 753859
Implantable Lead Location: 753860
Implantable Lead Model: 3830
Implantable Lead Model: 5076
Implantable Pulse Generator Implant Date: 20160606
Lead Channel Impedance Value: 304 Ohm
Lead Channel Impedance Value: 399 Ohm
Lead Channel Impedance Value: 418 Ohm
Lead Channel Impedance Value: 532 Ohm
Lead Channel Pacing Threshold Amplitude: 0.625 V
Lead Channel Pacing Threshold Amplitude: 1.25 V
Lead Channel Pacing Threshold Pulse Width: 0.4 ms
Lead Channel Pacing Threshold Pulse Width: 0.4 ms
Lead Channel Sensing Intrinsic Amplitude: 2.5 mV
Lead Channel Sensing Intrinsic Amplitude: 2.5 mV
Lead Channel Sensing Intrinsic Amplitude: 2.625 mV
Lead Channel Sensing Intrinsic Amplitude: 2.625 mV
Lead Channel Setting Pacing Amplitude: 2 V
Lead Channel Setting Pacing Amplitude: 2.5 V
Lead Channel Setting Pacing Pulse Width: 0.6 ms
Lead Channel Setting Sensing Sensitivity: 0.9 mV

## 2021-05-12 NOTE — Progress Notes (Signed)
Remote pacemaker transmission.   

## 2021-07-23 ENCOUNTER — Ambulatory Visit (INDEPENDENT_AMBULATORY_CARE_PROVIDER_SITE_OTHER): Payer: Self-pay

## 2021-07-23 DIAGNOSIS — I442 Atrioventricular block, complete: Secondary | ICD-10-CM

## 2021-07-25 LAB — CUP PACEART REMOTE DEVICE CHECK
Battery Remaining Longevity: 24 mo
Battery Voltage: 2.94 V
Brady Statistic AP VP Percent: 45.34 %
Brady Statistic AP VS Percent: 0.01 %
Brady Statistic AS VP Percent: 54.01 %
Brady Statistic AS VS Percent: 0.64 %
Brady Statistic RA Percent Paced: 45.12 %
Brady Statistic RV Percent Paced: 98.88 %
Date Time Interrogation Session: 20220913164359
Implantable Lead Implant Date: 20160606
Implantable Lead Implant Date: 20160606
Implantable Lead Location: 753859
Implantable Lead Location: 753860
Implantable Lead Model: 3830
Implantable Lead Model: 5076
Implantable Pulse Generator Implant Date: 20160606
Lead Channel Impedance Value: 266 Ohm
Lead Channel Impedance Value: 380 Ohm
Lead Channel Impedance Value: 399 Ohm
Lead Channel Impedance Value: 532 Ohm
Lead Channel Pacing Threshold Amplitude: 0.625 V
Lead Channel Pacing Threshold Amplitude: 1.25 V
Lead Channel Pacing Threshold Pulse Width: 0.4 ms
Lead Channel Pacing Threshold Pulse Width: 0.4 ms
Lead Channel Sensing Intrinsic Amplitude: 2.25 mV
Lead Channel Sensing Intrinsic Amplitude: 2.25 mV
Lead Channel Sensing Intrinsic Amplitude: 2.625 mV
Lead Channel Sensing Intrinsic Amplitude: 2.625 mV
Lead Channel Setting Pacing Amplitude: 2 V
Lead Channel Setting Pacing Amplitude: 2.5 V
Lead Channel Setting Pacing Pulse Width: 0.6 ms
Lead Channel Setting Sensing Sensitivity: 0.9 mV

## 2021-07-31 NOTE — Progress Notes (Signed)
Remote pacemaker transmission.   

## 2021-10-22 ENCOUNTER — Ambulatory Visit (INDEPENDENT_AMBULATORY_CARE_PROVIDER_SITE_OTHER): Payer: Self-pay

## 2021-10-22 DIAGNOSIS — I442 Atrioventricular block, complete: Secondary | ICD-10-CM

## 2021-10-26 LAB — CUP PACEART REMOTE DEVICE CHECK
Battery Remaining Longevity: 22 mo
Battery Voltage: 2.93 V
Brady Statistic AP VP Percent: 35.1 %
Brady Statistic AP VS Percent: 0.01 %
Brady Statistic AS VP Percent: 64.4 %
Brady Statistic AS VS Percent: 0.49 %
Brady Statistic RA Percent Paced: 34.99 %
Brady Statistic RV Percent Paced: 99.11 %
Date Time Interrogation Session: 20221215155408
Implantable Lead Implant Date: 20160606
Implantable Lead Implant Date: 20160606
Implantable Lead Location: 753859
Implantable Lead Location: 753860
Implantable Lead Model: 3830
Implantable Lead Model: 5076
Implantable Pulse Generator Implant Date: 20160606
Lead Channel Impedance Value: 285 Ohm
Lead Channel Impedance Value: 399 Ohm
Lead Channel Impedance Value: 399 Ohm
Lead Channel Impedance Value: 513 Ohm
Lead Channel Pacing Threshold Amplitude: 0.625 V
Lead Channel Pacing Threshold Amplitude: 1.125 V
Lead Channel Pacing Threshold Pulse Width: 0.4 ms
Lead Channel Pacing Threshold Pulse Width: 0.4 ms
Lead Channel Sensing Intrinsic Amplitude: 2.5 mV
Lead Channel Sensing Intrinsic Amplitude: 2.5 mV
Lead Channel Sensing Intrinsic Amplitude: 3 mV
Lead Channel Sensing Intrinsic Amplitude: 3 mV
Lead Channel Setting Pacing Amplitude: 2 V
Lead Channel Setting Pacing Amplitude: 2.5 V
Lead Channel Setting Pacing Pulse Width: 0.6 ms
Lead Channel Setting Sensing Sensitivity: 0.9 mV

## 2021-10-31 NOTE — Progress Notes (Signed)
Remote pacemaker transmission.   

## 2022-02-13 ENCOUNTER — Ambulatory Visit (INDEPENDENT_AMBULATORY_CARE_PROVIDER_SITE_OTHER): Payer: BLUE CROSS/BLUE SHIELD

## 2022-02-13 ENCOUNTER — Telehealth: Payer: Self-pay

## 2022-02-13 DIAGNOSIS — R42 Dizziness and giddiness: Secondary | ICD-10-CM | POA: Diagnosis not present

## 2022-02-13 DIAGNOSIS — Z950 Presence of cardiac pacemaker: Secondary | ICD-10-CM | POA: Diagnosis not present

## 2022-02-13 DIAGNOSIS — R0602 Shortness of breath: Secondary | ICD-10-CM | POA: Diagnosis not present

## 2022-02-13 DIAGNOSIS — R778 Other specified abnormalities of plasma proteins: Secondary | ICD-10-CM | POA: Diagnosis not present

## 2022-02-13 DIAGNOSIS — I442 Atrioventricular block, complete: Secondary | ICD-10-CM

## 2022-02-13 DIAGNOSIS — G90A Postural orthostatic tachycardia syndrome (POTS): Secondary | ICD-10-CM | POA: Diagnosis not present

## 2022-02-13 DIAGNOSIS — A1801 Tuberculosis of spine: Secondary | ICD-10-CM | POA: Diagnosis not present

## 2022-02-13 DIAGNOSIS — Z23 Encounter for immunization: Secondary | ICD-10-CM | POA: Diagnosis not present

## 2022-02-13 DIAGNOSIS — R079 Chest pain, unspecified: Secondary | ICD-10-CM | POA: Diagnosis not present

## 2022-02-13 DIAGNOSIS — R072 Precordial pain: Secondary | ICD-10-CM | POA: Diagnosis not present

## 2022-02-13 DIAGNOSIS — R0789 Other chest pain: Secondary | ICD-10-CM | POA: Diagnosis not present

## 2022-02-13 DIAGNOSIS — R7989 Other specified abnormal findings of blood chemistry: Secondary | ICD-10-CM | POA: Diagnosis not present

## 2022-02-13 LAB — CUP PACEART REMOTE DEVICE CHECK
Battery Remaining Longevity: 18 mo
Battery Voltage: 2.92 V
Brady Statistic AP VP Percent: 37.35 %
Brady Statistic AP VS Percent: 0.02 %
Brady Statistic AS VP Percent: 62.06 %
Brady Statistic AS VS Percent: 0.57 %
Brady Statistic RA Percent Paced: 37.17 %
Brady Statistic RV Percent Paced: 98.89 %
Date Time Interrogation Session: 20230404210628
Implantable Lead Implant Date: 20160606
Implantable Lead Implant Date: 20160606
Implantable Lead Location: 753859
Implantable Lead Location: 753860
Implantable Lead Model: 3830
Implantable Lead Model: 5076
Implantable Pulse Generator Implant Date: 20160606
Lead Channel Impedance Value: 266 Ohm
Lead Channel Impedance Value: 380 Ohm
Lead Channel Impedance Value: 380 Ohm
Lead Channel Impedance Value: 513 Ohm
Lead Channel Pacing Threshold Amplitude: 0.625 V
Lead Channel Pacing Threshold Amplitude: 1.5 V
Lead Channel Pacing Threshold Pulse Width: 0.4 ms
Lead Channel Pacing Threshold Pulse Width: 0.4 ms
Lead Channel Sensing Intrinsic Amplitude: 2 mV
Lead Channel Sensing Intrinsic Amplitude: 2 mV
Lead Channel Sensing Intrinsic Amplitude: 2.5 mV
Lead Channel Sensing Intrinsic Amplitude: 2.5 mV
Lead Channel Setting Pacing Amplitude: 2 V
Lead Channel Setting Pacing Amplitude: 2.5 V
Lead Channel Setting Pacing Pulse Width: 0.6 ms
Lead Channel Setting Sensing Sensitivity: 0.9 mV

## 2022-02-13 NOTE — Telephone Encounter (Signed)
The patient sent a transmission because she is feeling SOB, fatigue and feel like she can feel her heart beat in her throat. Transmission sent 02-12-2022. Her phone number is [phone]. ?

## 2022-02-13 NOTE — Telephone Encounter (Signed)
Spoke with patient, did not sound audibly SOB over the phone, reviewed patients pacemaker transmission from 02/12/22 at 2100  ? ?Normal Device function, AS/VP on presenting no recent arrhythmias.  ? ?Asked if patient had a way of checking her BP she stated she was at work, advised patient from a pacemaker standpoint at this time I did not have a reason for her symptoms patient also stated she has POTS advised patient I would forward this information on to SK for any further recommendations advised patient if symptoms get worse she needed to be evaluated in ED.   ? ?Patient is also overdue to see SK Patient stated she would prefer communication through [place]  ?

## 2022-02-14 DIAGNOSIS — I249 Acute ischemic heart disease, unspecified: Secondary | ICD-10-CM | POA: Diagnosis not present

## 2022-02-14 DIAGNOSIS — R079 Chest pain, unspecified: Secondary | ICD-10-CM | POA: Diagnosis not present

## 2022-02-15 DIAGNOSIS — R079 Chest pain, unspecified: Secondary | ICD-10-CM | POA: Diagnosis not present

## 2022-02-15 DIAGNOSIS — A1801 Tuberculosis of spine: Secondary | ICD-10-CM | POA: Diagnosis not present

## 2022-02-28 NOTE — Progress Notes (Signed)
Remote pacemaker transmission.   

## 2022-03-05 NOTE — Progress Notes (Deleted)
Electrophysiology Office Note Date: 03/05/2022  ID:  Laurie Phillips, DOB 04-Aug-1991, MRN [id number]  PCP: System, Provider Not In Primary Cardiologist: None Electrophysiologist: None ***  CC: Pacemaker follow-up  Laurie Phillips is a 31 y.o. female seen today for None for {Blank single:19197::"cardiac clearance","post hospital follow up","acute visit due to ***","routine electrophysiology followup"}.  Since {Blank single:19197::"last being seen in our clinic","discharge from hospital"} the patient reports doing ***.  she denies chest pain, palpitations, dyspnea, PND, orthopnea, nausea, vomiting, dizziness, syncope, edema, weight gain, or early satiety.  Device History: {Blank single:19197::"Medtronic","St. Jude","Boston Scientific","Biotronik"} {Blank single:19197::"Dual Chamber","Single Chamber","BiV","Leadless"} PPM implanted *** for ***  Past Medical History:  Diagnosis Date   AVNRT (AV nodal re-entry tachycardia) ([facility])    Bipolar 1 disorder ([facility])    CHB (complete heart block) ([facility])    s/p Dual chamber: Atrial and HIS pacemaker placement   Complication of anesthesia    itchy after pacemaker placed   Depression    doesn't take meds   Dyspnea    sitting/exertion/lying   Family history of adverse reaction to anesthesia    adopted   Headache    Nocturia    Pacemaker-His Bundle 11/27/2015   Pneumonia    Pott's disease    Sinus bradycardia    Spinal headache    Urinary frequency    Weakness    numbness and tingling in hands   Past Surgical History:  Procedure Laterality Date   CESAREAN SECTION N/A 03/08/2015   Procedure: CESAREAN SECTION;  Surgeon: Everett Graff, MD;  Location: [facility];  Service: Obstetrics;  Laterality: N/A;   GANGLION CYST EXCISION Left 11/09/2016   Procedure: LEFT WRIST VOLAR CARPAL GANGLION EXCISION;  Surgeon: Iran Planas, MD;  Location: [place];  Service: Orthopedics;  Laterality: Left;   KNEE ARTHROSCOPY Left    PACEMAKER PLACEMENT     TONSILLECTOMY       Current Outpatient Medications  Medication Sig Dispense Refill   albuterol (VENTOLIN HFA) 108 (90 Base) MCG/ACT inhaler Inhale 2 puffs into the lungs every 4 (four) hours as needed for wheezing or shortness of breath. (Patient not taking: Reported on 03/28/2021) 18 g 0   FLUoxetine (PROZAC) 10 MG capsule Take 10 mg by mouth daily.     ibuprofen (ADVIL,MOTRIN) 400 MG tablet Take 1 tablet (400 mg total) by mouth every 6 (six) hours as needed. (Patient not taking: Reported on 03/28/2021) 8 tablet 0   Multiple Vitamin (MULTIVITAMIN WITH MINERALS) TABS tablet Take 1 tablet by mouth once a week.     predniSONE (STERAPRED UNI-PAK 21 TAB) 10 MG (21) TBPK tablet '10mg'$  Tabs, 6 day taper. Use as directed 1 each 0   No current facility-administered medications for this visit.    Allergies:   Patient has no known allergies.   Social History: Social History   Socioeconomic History   Marital status: Married    Spouse name: Not on file   Number of children: Not on file   Years of education: Not on file   Highest education level: Not on file  Occupational History   Not on file  Tobacco Use   Smoking status: Former   Smokeless tobacco: Never   Tobacco comments:    quit smoking a couple of yrs ago  Substance and Sexual Activity   Alcohol use: Yes    Alcohol/week: 0.0 standard drinks    Comment: glass or 2 at dinner   Drug use: No   Sexual activity: Yes    Birth control/protection:  None  Other Topics Concern   Not on file  Social History Narrative   Not on file   Social Determinants of Health   Financial Resource Strain: Not on file  Food Insecurity: Not on file  Transportation Needs: Not on file  Physical Activity: Not on file  Stress: Not on file  Social Connections: Not on file  Intimate Partner Violence: Not on file    Family History: Family History  Adopted: Yes  Problem Relation Age of Onset   Healthy Mother    Healthy Father      Review of Systems: All other  systems reviewed and are otherwise negative except as noted above.  Physical Exam: There were no vitals filed for this visit.   GEN- The patient is well appearing, alert and oriented x 3 today.   HEENT: normocephalic, atraumatic; sclera clear, conjunctiva pink; hearing intact; oropharynx clear; neck supple  Lungs- Clear to ausculation bilaterally, normal work of breathing.  No wheezes, rales, rhonchi Heart- Regular rate and rhythm, no murmurs, rubs or gallops  GI- soft, non-tender, non-distended, bowel sounds present  Extremities- no clubbing or cyanosis. No edema MS- no significant deformity or atrophy Skin- warm and dry, no rash or lesion; PPM pocket well healed Psych- euthymic mood, full affect Neuro- strength and sensation are intact  PPM Interrogation- reviewed in detail today,  See PACEART report  EKG:  EKG {ACTION; IS/IS [id number]} ordered today. Personal review of ekg ordered {Blank single:19197::"today","***"} shows ***   Recent Labs: 03/28/2021: BUN 8; Creatinine, Ser 0.79; Hemoglobin 14.5; Platelets 283; Potassium 4.8; Sodium 136   Wt Readings from Last 3 Encounters:  03/28/21 170 lb (77.1 kg)  10/01/20 170 lb (77.1 kg)  09/07/20 165 lb (74.8 kg)     Other studies Reviewed: Additional studies/ records that were reviewed today include: Previous EP office notes, Previous remote checks, Most recent labwork.   Echo 02/14/2022 ([facility]) EF 55-60%  CTA pulmonar 02/13/2022 ([facility]) WNL  Assessment and Plan:  1. CHB s/p Medtronic PPM  Normal PPM function See Pace Art report No changes today  2. Dysautonomia We discussed the role of salt and water repletion, the importance of exercise, often needing to be started in the recumbent position, and the awareness of triggers and the role of ambient heat and dehydration   Current medicines are reviewed at length with the patient today.    Labs/ tests ordered today include: *** No orders of the defined types were placed in  this encounter.    Disposition:   Follow up with {Blank single:19197::"Dr. Allred","Dr. Arlan Organ. Klein","Dr. Camnitz","Dr. Lambert","EP APP"} in {Blank single:19197::"2 weeks","4 weeks","3 months","6 months","12 months","as usual post gen change"}    Signed, Annamaria Helling  03/05/2022 9:18 AM  [facility] [address] [place] [address] [phone] (office) [phone] (fax)

## 2022-03-07 ENCOUNTER — Encounter: Payer: BLUE CROSS/BLUE SHIELD | Admitting: Student

## 2022-03-07 DIAGNOSIS — Z950 Presence of cardiac pacemaker: Secondary | ICD-10-CM

## 2022-03-07 DIAGNOSIS — G901 Familial dysautonomia [Riley-Day]: Secondary | ICD-10-CM

## 2022-03-07 DIAGNOSIS — I442 Atrioventricular block, complete: Secondary | ICD-10-CM

## 2022-06-05 ENCOUNTER — Ambulatory Visit (INDEPENDENT_AMBULATORY_CARE_PROVIDER_SITE_OTHER): Payer: Self-pay

## 2022-06-05 DIAGNOSIS — I442 Atrioventricular block, complete: Secondary | ICD-10-CM

## 2022-06-06 LAB — CUP PACEART REMOTE DEVICE CHECK
Battery Remaining Longevity: 16 mo
Battery Voltage: 2.9 V
Brady Statistic AP VP Percent: 41.78 %
Brady Statistic AP VS Percent: 0.03 %
Brady Statistic AS VP Percent: 57.56 %
Brady Statistic AS VS Percent: 0.63 %
Brady Statistic RA Percent Paced: 41.65 %
Brady Statistic RV Percent Paced: 98.91 %
Date Time Interrogation Session: 20230726081648
Implantable Lead Implant Date: 20160606
Implantable Lead Implant Date: 20160606
Implantable Lead Location: 753859
Implantable Lead Location: 753860
Implantable Lead Model: 3830
Implantable Lead Model: 5076
Implantable Pulse Generator Implant Date: 20160606
Lead Channel Impedance Value: 266 Ohm
Lead Channel Impedance Value: 380 Ohm
Lead Channel Impedance Value: 380 Ohm
Lead Channel Impedance Value: 494 Ohm
Lead Channel Pacing Threshold Amplitude: 0.625 V
Lead Channel Pacing Threshold Amplitude: 1.375 V
Lead Channel Pacing Threshold Pulse Width: 0.4 ms
Lead Channel Pacing Threshold Pulse Width: 0.4 ms
Lead Channel Sensing Intrinsic Amplitude: 2 mV
Lead Channel Sensing Intrinsic Amplitude: 2 mV
Lead Channel Sensing Intrinsic Amplitude: 2.375 mV
Lead Channel Sensing Intrinsic Amplitude: 2.375 mV
Lead Channel Setting Pacing Amplitude: 2 V
Lead Channel Setting Pacing Amplitude: 2.5 V
Lead Channel Setting Pacing Pulse Width: 0.6 ms
Lead Channel Setting Sensing Sensitivity: 0.9 mV

## 2022-07-01 NOTE — Progress Notes (Signed)
Remote pacemaker transmission.   

## 2022-07-04 ENCOUNTER — Telehealth: Payer: Self-pay | Admitting: Internal Medicine

## 2022-07-04 NOTE — Telephone Encounter (Signed)
Pt c/o medication issue:  1. Name of Medication: Paxlovid  2. How are you currently taking this medication (dosage and times per day)? Not taken   3. Are you having a reaction (difficulty breathing--STAT)? no  4. What is your medication issue? Pt called stating she has covid and wants to know if Dr. Caryl Comes can prescribed this medication for her. Pt states she does have a PCP but her dad told her to ask Dr. Caryl Comes instead.

## 2022-07-04 NOTE — Telephone Encounter (Signed)
Spoke with pt who states she just returned from [place] and was not feeling right and has tested positive for Covid.  Pt advised since she does not have a PCP she should opt for an urgent care visit as Dr Caryl Comes is out of town until next week.  Pt verbalized understanding and thanked Therapist, sports for the call.

## 2023-04-03 ENCOUNTER — Ambulatory Visit (INDEPENDENT_AMBULATORY_CARE_PROVIDER_SITE_OTHER): Payer: BLUE CROSS/BLUE SHIELD

## 2023-04-03 DIAGNOSIS — I442 Atrioventricular block, complete: Secondary | ICD-10-CM | POA: Diagnosis not present

## 2023-04-03 LAB — CUP PACEART REMOTE DEVICE CHECK
Battery Remaining Longevity: 7 mo
Battery Voltage: 2.86 V
Brady Statistic AP VP Percent: 43.79 %
Brady Statistic AP VS Percent: 0.07 %
Brady Statistic AS VP Percent: 55.79 %
Brady Statistic AS VS Percent: 0.36 %
Brady Statistic RA Percent Paced: 43.74 %
Brady Statistic RV Percent Paced: 98.93 %
Date Time Interrogation Session: 20240523071219
Implantable Lead Connection Status: 753985
Implantable Lead Connection Status: 753985
Implantable Lead Implant Date: 20160606
Implantable Lead Implant Date: 20160606
Implantable Lead Location: 753859
Implantable Lead Location: 753860
Implantable Lead Model: 3830
Implantable Lead Model: 5076
Implantable Pulse Generator Implant Date: 20160606
Lead Channel Impedance Value: 266 Ohm
Lead Channel Impedance Value: 380 Ohm
Lead Channel Impedance Value: 399 Ohm
Lead Channel Impedance Value: 513 Ohm
Lead Channel Pacing Threshold Amplitude: 0.625 V
Lead Channel Pacing Threshold Amplitude: 1.625 V
Lead Channel Pacing Threshold Pulse Width: 0.4 ms
Lead Channel Pacing Threshold Pulse Width: 0.4 ms
Lead Channel Sensing Intrinsic Amplitude: 2.125 mV
Lead Channel Sensing Intrinsic Amplitude: 2.125 mV
Lead Channel Sensing Intrinsic Amplitude: 2.625 mV
Lead Channel Sensing Intrinsic Amplitude: 2.625 mV
Lead Channel Setting Pacing Amplitude: 2 V
Lead Channel Setting Pacing Amplitude: 2.5 V
Lead Channel Setting Pacing Pulse Width: 0.6 ms
Lead Channel Setting Sensing Sensitivity: 0.9 mV
Zone Setting Status: 755011
Zone Setting Status: 755011

## 2023-04-16 ENCOUNTER — Encounter: Payer: Self-pay | Admitting: General Practice

## 2023-04-23 NOTE — Progress Notes (Signed)
Remote pacemaker transmission.   

## 2023-06-12 ENCOUNTER — Ambulatory Visit: Payer: Medicaid Other

## 2023-06-12 DIAGNOSIS — I442 Atrioventricular block, complete: Secondary | ICD-10-CM

## 2023-06-13 ENCOUNTER — Encounter: Payer: Medicaid Other | Admitting: Internal Medicine

## 2023-06-13 LAB — CUP PACEART REMOTE DEVICE CHECK
Battery Remaining Longevity: 4 mo
Battery Voltage: 2.85 V
Brady Statistic AP VP Percent: 46.14 %
Brady Statistic AP VS Percent: 0.14 %
Brady Statistic AS VP Percent: 53.57 %
Brady Statistic AS VS Percent: 0.15 %
Brady Statistic RA Percent Paced: 46.24 %
Brady Statistic RV Percent Paced: 99.04 %
Date Time Interrogation Session: 20240802074832
Implantable Lead Connection Status: 753985
Implantable Lead Connection Status: 753985
Implantable Lead Implant Date: 20160606
Implantable Lead Implant Date: 20160606
Implantable Lead Location: 753859
Implantable Lead Location: 753860
Implantable Lead Model: 3830
Implantable Lead Model: 5076
Implantable Pulse Generator Implant Date: 20160606
Lead Channel Impedance Value: 266 Ohm
Lead Channel Impedance Value: 361 Ohm
Lead Channel Impedance Value: 418 Ohm
Lead Channel Impedance Value: 513 Ohm
Lead Channel Pacing Threshold Amplitude: 0.625 V
Lead Channel Pacing Threshold Amplitude: 1.375 V
Lead Channel Pacing Threshold Pulse Width: 0.4 ms
Lead Channel Pacing Threshold Pulse Width: 0.4 ms
Lead Channel Sensing Intrinsic Amplitude: 1.875 mV
Lead Channel Sensing Intrinsic Amplitude: 1.875 mV
Lead Channel Sensing Intrinsic Amplitude: 2.375 mV
Lead Channel Sensing Intrinsic Amplitude: 2.375 mV
Lead Channel Setting Pacing Amplitude: 2 V
Lead Channel Setting Pacing Amplitude: 2.5 V
Lead Channel Setting Pacing Pulse Width: 0.6 ms
Lead Channel Setting Sensing Sensitivity: 0.9 mV
Zone Setting Status: 755011
Zone Setting Status: 755011

## 2023-06-25 NOTE — Addendum Note (Signed)
Addended by: Elease Etienne A on: 06/25/2023 01:09 PM   Modules accepted: Level of Service

## 2023-06-25 NOTE — Progress Notes (Signed)
Remote pacemaker transmission.   

## 2023-07-04 ENCOUNTER — Ambulatory Visit (INDEPENDENT_AMBULATORY_CARE_PROVIDER_SITE_OTHER): Payer: BC Managed Care – PPO

## 2023-07-04 DIAGNOSIS — I442 Atrioventricular block, complete: Secondary | ICD-10-CM

## 2023-07-06 LAB — CUP PACEART REMOTE DEVICE CHECK
Battery Remaining Longevity: 3 mo
Battery Voltage: 2.85 V
Brady Statistic AP VP Percent: 53.55 %
Brady Statistic AP VS Percent: 0.05 %
Brady Statistic AS VP Percent: 46.2 %
Brady Statistic AS VS Percent: 0.2 %
Brady Statistic RA Percent Paced: 53.53 %
Brady Statistic RV Percent Paced: 99.44 %
Date Time Interrogation Session: 20240823163958
Implantable Lead Connection Status: 753985
Implantable Lead Connection Status: 753985
Implantable Lead Implant Date: 20160606
Implantable Lead Implant Date: 20160606
Implantable Lead Location: 753859
Implantable Lead Location: 753860
Implantable Lead Model: 3830
Implantable Lead Model: 5076
Implantable Pulse Generator Implant Date: 20160606
Lead Channel Impedance Value: 304 Ohm
Lead Channel Impedance Value: 380 Ohm
Lead Channel Impedance Value: 456 Ohm
Lead Channel Impedance Value: 513 Ohm
Lead Channel Pacing Threshold Amplitude: 0.625 V
Lead Channel Pacing Threshold Amplitude: 1.625 V
Lead Channel Pacing Threshold Pulse Width: 0.4 ms
Lead Channel Pacing Threshold Pulse Width: 0.4 ms
Lead Channel Sensing Intrinsic Amplitude: 2 mV
Lead Channel Sensing Intrinsic Amplitude: 2 mV
Lead Channel Sensing Intrinsic Amplitude: 2.375 mV
Lead Channel Sensing Intrinsic Amplitude: 2.375 mV
Lead Channel Setting Pacing Amplitude: 2 V
Lead Channel Setting Pacing Amplitude: 2.5 V
Lead Channel Setting Pacing Pulse Width: 0.6 ms
Lead Channel Setting Sensing Sensitivity: 0.9 mV
Zone Setting Status: 755011
Zone Setting Status: 755011

## 2023-07-09 NOTE — Progress Notes (Signed)
Remote pacemaker transmission.   

## 2023-08-11 ENCOUNTER — Encounter: Payer: Self-pay | Admitting: Internal Medicine

## 2023-08-11 ENCOUNTER — Ambulatory Visit: Payer: BC Managed Care – PPO | Attending: Internal Medicine | Admitting: Internal Medicine

## 2023-08-11 VITALS — BP 109/73 | HR 60 | Ht 62.0 in | Wt 168.8 lb

## 2023-08-11 DIAGNOSIS — G901 Familial dysautonomia [Riley-Day]: Secondary | ICD-10-CM

## 2023-08-11 DIAGNOSIS — Z01812 Encounter for preprocedural laboratory examination: Secondary | ICD-10-CM | POA: Diagnosis not present

## 2023-08-11 DIAGNOSIS — I442 Atrioventricular block, complete: Secondary | ICD-10-CM | POA: Diagnosis not present

## 2023-08-11 DIAGNOSIS — Z950 Presence of cardiac pacemaker: Secondary | ICD-10-CM

## 2023-08-11 NOTE — Patient Instructions (Signed)
Medication Instructions:  Your physician recommends that you continue on your current medications as directed. Please refer to the Current Medication list given to you today.  *If you need a refill on your cardiac medications before your next appointment, please call your pharmacy*   Lab Work: None ordered.  If you have labs (blood work) drawn today and your tests are completely normal, you will receive your results only by: MyChart Message (if you have MyChart) OR A paper copy in the mail If you have any lab test that is abnormal or we need to change your treatment, we will call you to review the results.   Testing/Procedures: Your physician has requested that you have an echocardiogram. Echocardiography is a painless test that uses sound waves to create images of your heart. It provides your doctor with information about the size and shape of your heart and how well your heart's chambers and valves are working. This procedure takes approximately one hour. There are no restrictions for this procedure. Please do NOT wear cologne, perfume, aftershave, or lotions (deodorant is allowed). Please arrive 15 minutes prior to your appointment time.    Follow-Up: At [facility], you and your health needs are our priority.  As part of our continuing mission to provide you with exceptional heart care, we have created designated Provider Care Teams.  These Care Teams include your primary Cardiologist (physician) and Advanced Practice Providers (APPs -  Physician Assistants and Nurse Practitioners) who all work together to provide you with the care you need, when you need it.  We recommend signing up for the patient portal called "MyChart".  Sign up information is provided on this After Visit Summary.  MyChart is used to connect with patients for Virtual Visits (Telemedicine).  Patients are able to view lab/test results, encounter notes, upcoming appointments, etc.  Non-urgent messages can be  sent to your provider as well.   To learn more about what you can do with MyChart, go to [email or web address].    Your next appointment:   To be scheduled

## 2023-08-11 NOTE — Progress Notes (Unsigned)
R dysautonomia     Patient Care Team: System, Provider Not In as PCP - General   HPI  Laurie Phillips is a 32 y.o. female followup for complete heart block  and symptoms suggestive of dysautonomia-- s/p His Bundle pacing  6/16 Device approaching ERI   DATE TEST EF   1/17 Echo   60 %                 Doing very well.  No dyspnea or lightheadedness.  No exercise tolerance.  Working.  She and her son Laurie Phillips now 18, have moved to [place] area.     Past Medical History:  Diagnosis Date   AVNRT (AV nodal re-entry tachycardia)    Bipolar 1 disorder (HCC)    CHB (complete heart block) (HCC)    s/p Dual chamber: Atrial and HIS pacemaker placement   Complication of anesthesia    itchy after pacemaker placed   Depression    doesn't take meds   Dyspnea    sitting/exertion/lying   Family history of adverse reaction to anesthesia    adopted   Headache    Nocturia    Pacemaker-His Bundle 11/27/2015   Pneumonia    Pott's disease    Sinus bradycardia    Spinal headache    Urinary frequency    Weakness    numbness and tingling in hands    Past Surgical History:  Procedure Laterality Date   CESAREAN SECTION N/A 03/08/2015   Procedure: CESAREAN SECTION;  Surgeon: Osborn Coho, MD;  Location: [facility];  Service: Obstetrics;  Laterality: N/A;   GANGLION CYST EXCISION Left 11/09/2016   Procedure: LEFT WRIST VOLAR CARPAL GANGLION EXCISION;  Surgeon: Bradly Bienenstock, MD;  Location: [facility];  Service: Orthopedics;  Laterality: Left;   KNEE ARTHROSCOPY Left    PACEMAKER PLACEMENT     TONSILLECTOMY      Current Outpatient Medications  Medication Sig Dispense Refill   Multiple Vitamin (MULTIVITAMIN WITH MINERALS) TABS tablet Take 1 tablet by mouth once a week.     albuterol (VENTOLIN HFA) 108 (90 Base) MCG/ACT inhaler Inhale 2 puffs into the lungs every 4 (four) hours as needed for wheezing or shortness of breath. (Patient not taking: Reported on 03/28/2021) 18 g 0   FLUoxetine  (PROZAC) 10 MG capsule Take 10 mg by mouth daily.     ibuprofen (ADVIL,MOTRIN) 400 MG tablet Take 1 tablet (400 mg total) by mouth every 6 (six) hours as needed. (Patient not taking: Reported on 03/28/2021) 8 tablet 0   predniSONE (STERAPRED UNI-PAK 21 TAB) 10 MG (21) TBPK tablet 10mg  Tabs, 6 day taper. Use as directed 1 each 0   No current facility-administered medications for this visit.    No Known Allergies    Review of Systems negative except from HPI and PMH  Physical Exam BP 109/73 (BP Location: Left Arm, Patient Position: Sitting, Cuff Size: Normal)   Pulse 60   Ht 5\' 2"  (1.575 m)   Wt 168 lb 12.8 oz (76.6 kg)   SpO2 98%   BMI 30.87 kg/m  Well developed and well nourished in no acute distress HENT normal Neck supple with JVP-flat Clear Device pocket well healed; without hematoma or erythema.  There is no tethering  Regular rate and rhythm, no murmur Abd-soft with active BS No Clubbing cyanosis  edema Skin-warm and dry A & Oriented  Grossly normal sensory and motor function  ECG sinus with P-synchronous/ AV  pacing QRSd 150 msec  Device  function is normal. 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  Prior to device generator replacement, not withstanding the paucity of symptoms, we will check an echocardiogram.  Hopefully with His bundle pacing and with what appears to be late his capture, LV function will be retained.  We have reviewed the benefits and risks of generator replacement.  These include but are not limited to lead fracture and infection.  The patient understands, agrees and is willing to proceed.

## 2023-08-11 NOTE — H&P (View-Only) (Signed)
R dysautonomia     Patient Care Team: System, Provider Not In as PCP - General   HPI  Laurie Phillips is a 32 y.o. female followup for complete heart block  and symptoms suggestive of dysautonomia-- s/p His Bundle pacing  6/16 Device approaching ERI   DATE TEST EF   1/17 Echo   60 %                 Doing very well.  No dyspnea or lightheadedness.  No exercise tolerance.  Working.  She and her son Gaynelle Adu now 18, have moved to [place] area.     Past Medical History:  Diagnosis Date   AVNRT (AV nodal re-entry tachycardia)    Bipolar 1 disorder (HCC)    CHB (complete heart block) (HCC)    s/p Dual chamber: Atrial and HIS pacemaker placement   Complication of anesthesia    itchy after pacemaker placed   Depression    doesn't take meds   Dyspnea    sitting/exertion/lying   Family history of adverse reaction to anesthesia    adopted   Headache    Nocturia    Pacemaker-His Bundle 11/27/2015   Pneumonia    Pott's disease    Sinus bradycardia    Spinal headache    Urinary frequency    Weakness    numbness and tingling in hands    Past Surgical History:  Procedure Laterality Date   CESAREAN SECTION N/A 03/08/2015   Procedure: CESAREAN SECTION;  Surgeon: Osborn Coho, MD;  Location: [facility];  Service: Obstetrics;  Laterality: N/A;   GANGLION CYST EXCISION Left 11/09/2016   Procedure: LEFT WRIST VOLAR CARPAL GANGLION EXCISION;  Surgeon: Bradly Bienenstock, MD;  Location: [facility];  Service: Orthopedics;  Laterality: Left;   KNEE ARTHROSCOPY Left    PACEMAKER PLACEMENT     TONSILLECTOMY      Current Outpatient Medications  Medication Sig Dispense Refill   Multiple Vitamin (MULTIVITAMIN WITH MINERALS) TABS tablet Take 1 tablet by mouth once a week.     albuterol (VENTOLIN HFA) 108 (90 Base) MCG/ACT inhaler Inhale 2 puffs into the lungs every 4 (four) hours as needed for wheezing or shortness of breath. (Patient not taking: Reported on 03/28/2021) 18 g 0   FLUoxetine  (PROZAC) 10 MG capsule Take 10 mg by mouth daily.     ibuprofen (ADVIL,MOTRIN) 400 MG tablet Take 1 tablet (400 mg total) by mouth every 6 (six) hours as needed. (Patient not taking: Reported on 03/28/2021) 8 tablet 0   predniSONE (STERAPRED UNI-PAK 21 TAB) 10 MG (21) TBPK tablet 10mg  Tabs, 6 day taper. Use as directed 1 each 0   No current facility-administered medications for this visit.    No Known Allergies    Review of Systems negative except from HPI and PMH  Physical Exam BP 109/73 (BP Location: Left Arm, Patient Position: Sitting, Cuff Size: Normal)   Pulse 60   Ht 5\' 2"  (1.575 m)   Wt 168 lb 12.8 oz (76.6 kg)   SpO2 98%   BMI 30.87 kg/m  Well developed and well nourished in no acute distress HENT normal Neck supple with JVP-flat Clear Device pocket well healed; without hematoma or erythema.  There is no tethering  Regular rate and rhythm, no murmur Abd-soft with active BS No Clubbing cyanosis  edema Skin-warm and dry A & Oriented  Grossly normal sensory and motor function  ECG sinus with P-synchronous/ AV  pacing QRSd 150 msec  Device  function is normal. 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  Prior to device generator replacement, not withstanding the paucity of symptoms, we will check an echocardiogram.  Hopefully with His bundle pacing and with what appears to be late his capture, LV function will be retained.  We have reviewed the benefits and risks of generator replacement.  These include but are not limited to lead fracture and infection.  The patient understands, agrees and is willing to proceed.

## 2023-08-13 LAB — CUP PACEART INCLINIC DEVICE CHECK
Date Time Interrogation Session: 20240930071201
Implantable Lead Connection Status: 753985
Implantable Lead Connection Status: 753985
Implantable Lead Implant Date: 20160606
Implantable Lead Implant Date: 20160606
Implantable Lead Location: 753859
Implantable Lead Location: 753860
Implantable Lead Model: 3830
Implantable Lead Model: 5076
Implantable Pulse Generator Implant Date: 20160606
Lead Channel Setting Pacing Amplitude: 2 V
Lead Channel Setting Pacing Amplitude: 2.5 V
Lead Channel Setting Pacing Pulse Width: 0.6 ms
Lead Channel Setting Sensing Sensitivity: 0.9 mV
Zone Setting Status: 755011
Zone Setting Status: 755011

## 2023-08-14 ENCOUNTER — Ambulatory Visit: Payer: Medicaid Other

## 2023-08-14 DIAGNOSIS — I442 Atrioventricular block, complete: Secondary | ICD-10-CM

## 2023-08-16 LAB — CUP PACEART REMOTE DEVICE CHECK
Battery Remaining Longevity: 2 mo
Battery Voltage: 2.84 V
Brady Statistic AP VP Percent: 2.35 %
Brady Statistic AP VS Percent: 64.46 %
Brady Statistic AS VP Percent: 0.29 %
Brady Statistic AS VS Percent: 32.9 %
Brady Statistic RA Percent Paced: 66.55 %
Brady Statistic RV Percent Paced: 2.65 %
Date Time Interrogation Session: 20241004152951
Implantable Lead Connection Status: 753985
Implantable Lead Connection Status: 753985
Implantable Lead Implant Date: 20160606
Implantable Lead Implant Date: 20160606
Implantable Lead Location: 753859
Implantable Lead Location: 753860
Implantable Lead Model: 3830
Implantable Lead Model: 5076
Implantable Pulse Generator Implant Date: 20160606
Lead Channel Impedance Value: 285 Ohm
Lead Channel Impedance Value: 361 Ohm
Lead Channel Impedance Value: 418 Ohm
Lead Channel Impedance Value: 494 Ohm
Lead Channel Pacing Threshold Amplitude: 0.625 V
Lead Channel Pacing Threshold Amplitude: 1.5 V
Lead Channel Pacing Threshold Pulse Width: 0.4 ms
Lead Channel Pacing Threshold Pulse Width: 0.4 ms
Lead Channel Sensing Intrinsic Amplitude: 1.875 mV
Lead Channel Sensing Intrinsic Amplitude: 1.875 mV
Lead Channel Sensing Intrinsic Amplitude: 2.375 mV
Lead Channel Sensing Intrinsic Amplitude: 2.375 mV
Lead Channel Setting Pacing Amplitude: 2 V
Lead Channel Setting Pacing Amplitude: 2.5 V
Lead Channel Setting Pacing Pulse Width: 0.6 ms
Lead Channel Setting Sensing Sensitivity: 0.9 mV
Zone Setting Status: 755011
Zone Setting Status: 755011

## 2023-08-26 ENCOUNTER — Other Ambulatory Visit: Payer: Self-pay | Admitting: Internal Medicine

## 2023-08-26 ENCOUNTER — Ambulatory Visit (HOSPITAL_COMMUNITY): Payer: BC Managed Care – PPO | Attending: Internal Medicine

## 2023-08-26 ENCOUNTER — Ambulatory Visit: Payer: BC Managed Care – PPO

## 2023-08-26 DIAGNOSIS — Z950 Presence of cardiac pacemaker: Secondary | ICD-10-CM | POA: Diagnosis not present

## 2023-08-26 DIAGNOSIS — Z01812 Encounter for preprocedural laboratory examination: Secondary | ICD-10-CM | POA: Insufficient documentation

## 2023-08-26 DIAGNOSIS — G901 Familial dysautonomia [Riley-Day]: Secondary | ICD-10-CM | POA: Diagnosis not present

## 2023-08-26 DIAGNOSIS — I442 Atrioventricular block, complete: Secondary | ICD-10-CM | POA: Insufficient documentation

## 2023-08-26 LAB — ECHOCARDIOGRAM COMPLETE
Area-P 1/2: 4.74 cm2
S' Lateral: 3 cm

## 2023-08-26 NOTE — Pre-Procedure Instructions (Signed)
Instructed patient on the following items: Arrival time 0800 Nothing to eat or drink after midnight No meds AM of procedure Responsible person to drive you home and stay with you for 24 hrs

## 2023-08-27 ENCOUNTER — Ambulatory Visit (HOSPITAL_COMMUNITY)
Admission: RE | Admit: 2023-08-27 | Discharge: 2023-08-27 | Disposition: A | Discharge status: Home / Self Care | Payer: BC Managed Care – PPO | Attending: Internal Medicine | Admitting: Internal Medicine

## 2023-08-27 ENCOUNTER — Encounter (HOSPITAL_COMMUNITY)
Admission: RE | Disposition: A | Discharge status: Home / Self Care | Payer: BC Managed Care – PPO | Source: Home / Self Care | Attending: Internal Medicine

## 2023-08-27 ENCOUNTER — Other Ambulatory Visit: Payer: Self-pay

## 2023-08-27 DIAGNOSIS — I442 Atrioventricular block, complete: Secondary | ICD-10-CM | POA: Diagnosis not present

## 2023-08-27 DIAGNOSIS — Z950 Presence of cardiac pacemaker: Secondary | ICD-10-CM

## 2023-08-27 DIAGNOSIS — Z658 Other specified problems related to psychosocial circumstances: Secondary | ICD-10-CM | POA: Insufficient documentation

## 2023-08-27 DIAGNOSIS — Z45010 Encounter for checking and testing of cardiac pacemaker pulse generator [battery]: Secondary | ICD-10-CM | POA: Insufficient documentation

## 2023-08-27 DIAGNOSIS — G901 Familial dysautonomia [Riley-Day]: Secondary | ICD-10-CM | POA: Insufficient documentation

## 2023-08-27 HISTORY — PX: PPM GENERATOR CHANGEOUT: EP1233

## 2023-08-27 LAB — CBC
Hematocrit: 44.3 % (ref 34.0–46.6)
Hemoglobin: 14.8 g/dL (ref 11.1–15.9)
MCH: 30.6 pg (ref 26.6–33.0)
MCHC: 33.4 g/dL (ref 31.5–35.7)
MCV: 92 fL (ref 79–97)
Platelets: 382 10*3/uL (ref 150–450)
RBC: 4.83 x10E6/uL (ref 3.77–5.28)
RDW: 12.2 % (ref 11.7–15.4)
WBC: 6.6 10*3/uL (ref 3.4–10.8)

## 2023-08-27 LAB — BASIC METABOLIC PANEL
BUN/Creatinine Ratio: 8 — ABNORMAL LOW (ref 9–23)
BUN: 6 mg/dL (ref 6–20)
CO2: 22 mmol/L (ref 20–29)
Calcium: 9.6 mg/dL (ref 8.7–10.2)
Chloride: 103 mmol/L (ref 96–106)
Creatinine, Ser: 0.72 mg/dL (ref 0.57–1.00)
Glucose: 94 mg/dL (ref 70–99)
Potassium: 5 mmol/L (ref 3.5–5.2)
Sodium: 140 mmol/L (ref 134–144)
eGFR: 114 mL/min/{1.73_m2} (ref >59)

## 2023-08-27 LAB — PREGNANCY, URINE: Preg Test, Ur: NEGATIVE

## 2023-08-27 SURGERY — PPM GENERATOR CHANGEOUT

## 2023-08-27 MED ORDER — LIDOCAINE HCL (PF) 1 % IJ SOLN
INTRAMUSCULAR | Status: DC | PRN
  Administered 2023-08-27: 60 mL

## 2023-08-27 MED ORDER — CEFAZOLIN SODIUM-DEXTROSE 2-4 GM/100ML-% IV SOLN
2.0000 g | INTRAVENOUS | Status: AC
  Administered 2023-08-27: 2 g via INTRAVENOUS

## 2023-08-27 MED ORDER — CHLORHEXIDINE GLUCONATE 4 % EX SOLN
4.0000 | Freq: Once | CUTANEOUS | Status: DC
  Filled 2023-08-27: qty 60

## 2023-08-27 MED ORDER — LIDOCAINE HCL 1 % IJ SOLN
INTRAMUSCULAR | Status: AC
  Filled 2023-08-27: qty 40

## 2023-08-27 MED ORDER — MIDAZOLAM HCL 5 MG/5ML IJ SOLN
INTRAMUSCULAR | Status: DC | PRN
  Administered 2023-08-27: 1 mg via INTRAVENOUS
  Administered 2023-08-27: 3 mg via INTRAVENOUS

## 2023-08-27 MED ORDER — FENTANYL CITRATE (PF) 100 MCG/2ML IJ SOLN
INTRAMUSCULAR | Status: DC | PRN
  Administered 2023-08-27: 25 ug via INTRAVENOUS
  Administered 2023-08-27: 50 ug via INTRAVENOUS

## 2023-08-27 MED ORDER — CEFAZOLIN SODIUM-DEXTROSE 2-4 GM/100ML-% IV SOLN
INTRAVENOUS | Status: AC
  Filled 2023-08-27: qty 100

## 2023-08-27 MED ORDER — SODIUM CHLORIDE 0.9 % IV SOLN
80.0000 mg | INTRAVENOUS | Status: AC
  Administered 2023-08-27: 80 mg

## 2023-08-27 MED ORDER — MIDAZOLAM HCL 5 MG/5ML IJ SOLN
INTRAMUSCULAR | Status: AC
  Filled 2023-08-27: qty 5

## 2023-08-27 MED ORDER — SODIUM CHLORIDE 0.9 % IV SOLN: INTRAVENOUS | Status: DC

## 2023-08-27 MED ORDER — SODIUM CHLORIDE 0.9 % IV SOLN
INTRAVENOUS | Status: AC
  Filled 2023-08-27: qty 2

## 2023-08-27 MED ORDER — ACETAMINOPHEN 325 MG PO TABS: 325.0000 mg | ORAL_TABLET | ORAL | Status: DC | PRN

## 2023-08-27 MED ORDER — FENTANYL CITRATE (PF) 100 MCG/2ML IJ SOLN
INTRAMUSCULAR | Status: AC
  Filled 2023-08-27: qty 2

## 2023-08-27 SURGICAL SUPPLY — 6 items
CABLE SURGICAL S-101-97-12 (CABLE) ×1 IMPLANT
IPG PACE AZUR XT DR MRI W1DR01 (Pacemaker) IMPLANT
KIT WRENCH (KITS) IMPLANT
PACE AZURE XT DR MRI W1DR01 (Pacemaker) ×1 IMPLANT
PAD DEFIB RADIO PHYSIO CONN (PAD) ×1 IMPLANT
TRAY PACEMAKER INSERTION (PACKS) ×1 IMPLANT

## 2023-08-27 NOTE — Interval H&P Note (Signed)
History and Physical Interval Note:  08/27/2023 8:57 AM  Laurie Phillips  has presented today for surgery, with the diagnosis of eri.  The various methods of treatment have been discussed with the patient and family. After consideration of risks, benefits and other options for treatment, the patient has consented to  Procedure(s): PPM GENERATOR CHANGEOUT (N/A) as a surgical intervention.  The patient's history has been reviewed, patient examined, no change in status, stable for surgery.  I have reviewed the patient's chart and labs.  Questions were answered to the patient's satisfaction.     Sherryl Manges

## 2023-08-27 NOTE — Discharge Instructions (Signed)

## 2023-08-27 NOTE — Interval H&P Note (Signed)
History and Physical Interval Note:  08/27/2023 8:51 AM  Laurie Phillips  has presented today for surgery, with the diagnosis of eri.  The various methods of treatment have been discussed with the patient and family. After consideration of risks, benefits and other options for treatment, the patient has consented to  Procedure(s): PPM GENERATOR CHANGEOUT (N/A) as a surgical intervention.  The patient's history has been reviewed, patient examined, no change in status, stable for surgery.  I have reviewed the patient's chart and labs.  Questions were answered to the patient's satisfaction.     Sherryl Manges  Mild cold this last week--no fever

## 2023-08-28 ENCOUNTER — Encounter (HOSPITAL_COMMUNITY): Payer: Self-pay | Admitting: Internal Medicine

## 2023-08-29 NOTE — Progress Notes (Signed)
Remote pacemaker transmission.   

## 2023-09-10 ENCOUNTER — Ambulatory Visit: Payer: BC Managed Care – PPO | Attending: Cardiology

## 2023-09-10 ENCOUNTER — Ambulatory Visit: Payer: BC Managed Care – PPO

## 2023-09-10 DIAGNOSIS — I442 Atrioventricular block, complete: Secondary | ICD-10-CM | POA: Diagnosis not present

## 2023-09-10 LAB — CUP PACEART INCLINIC DEVICE CHECK
Battery Remaining Longevity: 160 mo
Battery Voltage: 3.22 V
Brady Statistic AP VP Percent: 1.29 %
Brady Statistic AP VS Percent: 49 %
Brady Statistic AS VP Percent: 0.2 %
Brady Statistic AS VS Percent: 49.52 %
Brady Statistic RA Percent Paced: 50.51 %
Brady Statistic RV Percent Paced: 1.49 %
Date Time Interrogation Session: 20241030131004
Implantable Lead Connection Status: 753985
Implantable Lead Connection Status: 753985
Implantable Lead Implant Date: 20160606
Implantable Lead Implant Date: 20160606
Implantable Lead Location: 753859
Implantable Lead Location: 753860
Implantable Lead Model: 3830
Implantable Lead Model: 5076
Implantable Pulse Generator Implant Date: 20241016
Lead Channel Impedance Value: 285 Ohm
Lead Channel Impedance Value: 342 Ohm
Lead Channel Impedance Value: 437 Ohm
Lead Channel Impedance Value: 494 Ohm
Lead Channel Pacing Threshold Amplitude: 1.375 V
Lead Channel Pacing Threshold Amplitude: 1.75 V
Lead Channel Pacing Threshold Pulse Width: 0.4 ms
Lead Channel Pacing Threshold Pulse Width: 0.4 ms
Lead Channel Sensing Intrinsic Amplitude: 2.125 mV
Lead Channel Sensing Intrinsic Amplitude: 2.25 mV
Lead Channel Sensing Intrinsic Amplitude: 2.5 mV
Lead Channel Sensing Intrinsic Amplitude: 2.875 mV
Lead Channel Setting Pacing Amplitude: 3.5 V
Lead Channel Setting Pacing Amplitude: 3.5 V
Lead Channel Setting Pacing Pulse Width: 1 ms
Lead Channel Setting Sensing Sensitivity: 0.9 mV
Zone Setting Status: 755011
Zone Setting Status: 755011

## 2023-09-10 NOTE — Progress Notes (Signed)
Patient had a vagal response during check. Cool application applied to forehead and ice provider.   Wound check appointment. Dermabond removed. Wound without redness or edema. Incision edges approximated, wound well healed. Normal device function. Thresholds, sensing, and impedances consistent with implant measurements. Device programmed at chronic outputs post gen change. Histogram distribution appropriate for patient and level of activity. No mode switches. One brief episode of non sustained tachycardia appears to be 1:1 with possible TWO. Patient educated no arm mobility or lifting restrictions. ROV in 3 months with implanting physician.

## 2023-09-10 NOTE — Patient Instructions (Signed)
   After Your Pacemaker   Monitor your pacemaker site for redness, swelling, and drainage. Call the device clinic at [phone] if you experience these symptoms or fever/chills.  Your incision was closed with Dermabond:  You may shower 1 day after your defibrillator implant and wash your incision with soap and water. Avoid lotions, ointments, or perfumes over your incision until it is well-healed.  You may use a hot tub or a pool after your wound check appointment if the incision is completely closed.  There are no restrictions in arm movement after your wound check appointment.  You may drive, unless driving has been restricted by your healthcare providers.  Remote monitoring is used to monitor your pacemaker from home. This monitoring is scheduled every 91 days by our office. It allows [place] to keep an eye on the functioning of your device to ensure it is working properly. You will routinely see your Electrophysiologist annually (more often if necessary).

## 2023-09-11 ENCOUNTER — Encounter: Payer: Medicaid Other | Admitting: Internal Medicine

## 2023-11-27 ENCOUNTER — Ambulatory Visit (INDEPENDENT_AMBULATORY_CARE_PROVIDER_SITE_OTHER): Payer: BC Managed Care – PPO

## 2023-11-27 DIAGNOSIS — I442 Atrioventricular block, complete: Secondary | ICD-10-CM

## 2023-11-30 LAB — CUP PACEART REMOTE DEVICE CHECK
Battery Remaining Longevity: 154 mo
Battery Voltage: 3.19 V
Brady Statistic AP VP Percent: 1.1 %
Brady Statistic AP VS Percent: 53.79 %
Brady Statistic AS VP Percent: 0.24 %
Brady Statistic AS VS Percent: 44.88 %
Brady Statistic RA Percent Paced: 55.04 %
Brady Statistic RV Percent Paced: 1.34 %
Date Time Interrogation Session: 20250117145033
Implantable Lead Connection Status: 753985
Implantable Lead Connection Status: 753985
Implantable Lead Implant Date: 20160606
Implantable Lead Implant Date: 20160606
Implantable Lead Location: 753859
Implantable Lead Location: 753860
Implantable Lead Model: 3830
Implantable Lead Model: 5076
Implantable Pulse Generator Implant Date: 20241016
Lead Channel Impedance Value: 266 Ohm
Lead Channel Impedance Value: 323 Ohm
Lead Channel Impedance Value: 399 Ohm
Lead Channel Impedance Value: 456 Ohm
Lead Channel Pacing Threshold Amplitude: 1.75 V
Lead Channel Pacing Threshold Amplitude: 1.75 V
Lead Channel Pacing Threshold Pulse Width: 0.4 ms
Lead Channel Pacing Threshold Pulse Width: 0.4 ms
Lead Channel Sensing Intrinsic Amplitude: 2 mV
Lead Channel Sensing Intrinsic Amplitude: 2 mV
Lead Channel Sensing Intrinsic Amplitude: 2.25 mV
Lead Channel Sensing Intrinsic Amplitude: 2.25 mV
Lead Channel Setting Pacing Amplitude: 3.5 V
Lead Channel Setting Pacing Amplitude: 3.5 V
Lead Channel Setting Pacing Pulse Width: 1 ms
Lead Channel Setting Sensing Sensitivity: 0.9 mV
Zone Setting Status: 755011
Zone Setting Status: 755011

## 2023-12-09 ENCOUNTER — Ambulatory Visit: Payer: BC Managed Care – PPO | Attending: Internal Medicine | Admitting: Internal Medicine

## 2023-12-09 DIAGNOSIS — G901 Familial dysautonomia [Riley-Day]: Secondary | ICD-10-CM

## 2023-12-09 DIAGNOSIS — I442 Atrioventricular block, complete: Secondary | ICD-10-CM

## 2023-12-09 DIAGNOSIS — Z950 Presence of cardiac pacemaker: Secondary | ICD-10-CM

## 2024-01-06 NOTE — Addendum Note (Signed)
 Addended by: Elease Etienne A on: 01/06/2024 09:50 AM   Modules accepted: Orders

## 2024-01-06 NOTE — Progress Notes (Signed)
 Remote pacemaker transmission.

## 2024-01-07 ENCOUNTER — Encounter: Payer: Self-pay | Admitting: Internal Medicine

## 2024-02-20 DIAGNOSIS — M2391 Unspecified internal derangement of right knee: Secondary | ICD-10-CM | POA: Diagnosis not present

## 2024-02-26 ENCOUNTER — Ambulatory Visit (INDEPENDENT_AMBULATORY_CARE_PROVIDER_SITE_OTHER): Payer: BC Managed Care – PPO

## 2024-02-26 DIAGNOSIS — I442 Atrioventricular block, complete: Secondary | ICD-10-CM | POA: Diagnosis not present

## 2024-02-27 LAB — CUP PACEART REMOTE DEVICE CHECK
Battery Remaining Longevity: 133 mo
Battery Voltage: 3.15 V
Brady Statistic AP VP Percent: 1.31 %
Brady Statistic AP VS Percent: 49.99 %
Brady Statistic AS VP Percent: 0.24 %
Brady Statistic AS VS Percent: 48.46 %
Brady Statistic RA Percent Paced: 51.44 %
Brady Statistic RV Percent Paced: 1.56 %
Date Time Interrogation Session: 20250417000930
Implantable Lead Connection Status: 753985
Implantable Lead Connection Status: 753985
Implantable Lead Implant Date: 20160606
Implantable Lead Implant Date: 20160606
Implantable Lead Location: 753859
Implantable Lead Location: 753860
Implantable Lead Model: 3830
Implantable Lead Model: 5076
Implantable Pulse Generator Implant Date: 20241016
Lead Channel Impedance Value: 266 Ohm
Lead Channel Impedance Value: 323 Ohm
Lead Channel Impedance Value: 418 Ohm
Lead Channel Impedance Value: 475 Ohm
Lead Channel Pacing Threshold Amplitude: 1.375 V
Lead Channel Pacing Threshold Amplitude: 1.625 V
Lead Channel Pacing Threshold Pulse Width: 0.4 ms
Lead Channel Pacing Threshold Pulse Width: 0.4 ms
Lead Channel Sensing Intrinsic Amplitude: 2 mV
Lead Channel Sensing Intrinsic Amplitude: 2 mV
Lead Channel Sensing Intrinsic Amplitude: 2.5 mV
Lead Channel Sensing Intrinsic Amplitude: 2.5 mV
Lead Channel Setting Pacing Amplitude: 3.5 V
Lead Channel Setting Pacing Amplitude: 3.5 V
Lead Channel Setting Pacing Pulse Width: 0.4 ms
Lead Channel Setting Sensing Sensitivity: 0.9 mV
Zone Setting Status: 755011
Zone Setting Status: 755011

## 2024-03-08 ENCOUNTER — Encounter: Payer: Self-pay | Admitting: Cardiology

## 2024-04-07 NOTE — Addendum Note (Signed)
 Addended by: Lott Rouleau A on: 04/07/2024 01:10 PM   Modules accepted: Orders

## 2024-04-07 NOTE — Progress Notes (Unsigned)
  Electrophysiology Office Note:   ID:  Laurie Phillips, DOB Apr 02, 1991, MRN [id number]  Primary Cardiologist: None Electrophysiologist: None *** {Click to update primary MD,subspecialty MD or APP then REFRESH:1}    History of Present Illness:   Laurie Phillips is a 33 y.o. female with h/o intermittent CHB and symptoms of dysautonomia seen today for routine electrophysiology followup.   Since last being seen in our clinic the patient reports doing ***.  she denies chest pain, palpitations, dyspnea, PND, orthopnea, nausea, vomiting, dizziness, syncope, edema, weight gain, or early satiety.   Review of systems complete and found to be negative unless listed in HPI.   EP Information / Studies Reviewed:    EKG is ordered today. Personal review as below.       PPM Interrogation-  reviewed in detail today,  See PACEART report.  Arrhythmia/Device History PPM-MedtronicBundle of His   cMRI 03/2015 with EF 68%, no LGE  Echo 08/2023 LVEF 50-55%,   Physical Exam:   VS:  There were no vitals taken for this visit.   Wt Readings from Last 3 Encounters:  08/27/23 170 lb (77.1 kg)  08/11/23 168 lb 12.8 oz (76.6 kg)  03/28/21 170 lb (77.1 kg)     GEN: No acute distress  NECK: No JVD; No carotid bruits CARDIAC: {EPRHYTHM:28826}, no murmurs, rubs, gallops RESPIRATORY:  Clear to auscultation without rales, wheezing or rhonchi  ABDOMEN: Soft, non-tender, non-distended EXTREMITIES:  {EDEMA LEVEL:28147::"No"} edema; No deformity   ASSESSMENT AND PLAN:    CHB s/p Medtronic PPM  Normal PPM function See Pace Art report No changes today  Dysautonomia We discussed the role of salt and water repletion, the importance of exercise, often needing to be started in the recumbent position, and the awareness of triggers and the role of ambient heat and dehydration.  Discussed salt substitutes with a  goal of 2-3 gms of Sodium or 5-7 gm of sodium Chloride  daily.  Rehydration solutions include Liquid IV,  NUUN, TriOral, Normralyte pedialyte advanced care and Banana Bags.  Salt tablets include plain salt tablets, SaltStick Vitassium, thermatabs amongst others.   Salt supplements are best used with adjunctive sugar  Discussed the importance of compression wear, specifically thigh or abdominal compression. This can be accomplished separately or with combined thigh/pelvic/abdominal compression wear such as "Spanx".    {Click here to Review PMH, Prob List, Meds, Allergies, SHx, FHx  :1}   Disposition:   Follow up with {EPPROVIDERS:28135} {EPFOLLOW UP:28173}  Signed, Tylene Galla, PA-C

## 2024-04-07 NOTE — Progress Notes (Signed)
 Remote pacemaker transmission.

## 2024-04-09 ENCOUNTER — Encounter: Payer: Self-pay | Admitting: Student

## 2024-04-09 ENCOUNTER — Ambulatory Visit: Attending: Student | Admitting: Student

## 2024-04-09 VITALS — BP 94/62 | Ht 62.0 in | Wt 142.6 lb

## 2024-04-09 DIAGNOSIS — I442 Atrioventricular block, complete: Secondary | ICD-10-CM

## 2024-04-09 DIAGNOSIS — G901 Familial dysautonomia [Riley-Day]: Secondary | ICD-10-CM

## 2024-04-09 DIAGNOSIS — Z950 Presence of cardiac pacemaker: Secondary | ICD-10-CM | POA: Diagnosis not present

## 2024-04-09 NOTE — Patient Instructions (Signed)
 Medication Instructions:  No medication changes today. *If you need a refill on your cardiac medications before your next appointment, please call your pharmacy*  Lab Work: No labwork ordered today. If you have labs (blood work) drawn today and your tests are completely normal, you will receive your results only by: MyChart Message (if you have MyChart) OR A paper copy in the mail If you have any lab test that is abnormal or we need to change your treatment, we will call you to review the results.  Testing/Procedures: No testing ordered today  Follow-Up: At [facility], you and your health needs are our priority.  As part of our continuing mission to provide you with exceptional heart care, our providers are all part of one team.  This team includes your primary Cardiologist (physician) and Advanced Practice Providers or APPs (Physician Assistants and Nurse Practitioners) who all work together to provide you with the care you need, when you need it.  Your next appointment:   12 month(s)  Provider:   You may see Boyce Byes, MD or one of the following Advanced Practice Providers on your designated Care Team:   Mertha Abrahams, [place] Bambi Lever "Jonelle Neri" Willard, PA-C Suzann Riddle, NP Creighton Doffing, NP    We recommend signing up for the patient portal called "MyChart".  Sign up information is provided on this After Visit Summary.  MyChart is used to connect with patients for Virtual Visits (Telemedicine).  Patients are able to view lab/test results, encounter notes, upcoming appointments, etc.  Non-urgent messages can be sent to your provider as well.   To learn more about what you can do with MyChart, go to [email or web address].

## 2024-04-20 DIAGNOSIS — S83511A Sprain of anterior cruciate ligament of right knee, initial encounter: Secondary | ICD-10-CM | POA: Diagnosis not present

## 2024-04-20 DIAGNOSIS — M2391 Unspecified internal derangement of right knee: Secondary | ICD-10-CM | POA: Diagnosis not present

## 2024-04-20 DIAGNOSIS — M25461 Effusion, right knee: Secondary | ICD-10-CM | POA: Diagnosis not present

## 2024-04-21 DIAGNOSIS — S83511A Sprain of anterior cruciate ligament of right knee, initial encounter: Secondary | ICD-10-CM | POA: Diagnosis not present

## 2024-04-28 DIAGNOSIS — Z01818 Encounter for other preprocedural examination: Secondary | ICD-10-CM | POA: Diagnosis not present

## 2024-04-29 ENCOUNTER — Encounter: Payer: Self-pay | Admitting: Cardiology

## 2024-04-30 DIAGNOSIS — M25561 Pain in right knee: Secondary | ICD-10-CM | POA: Diagnosis not present

## 2024-04-30 DIAGNOSIS — S83511D Sprain of anterior cruciate ligament of right knee, subsequent encounter: Secondary | ICD-10-CM | POA: Diagnosis not present

## 2024-05-07 DIAGNOSIS — M25561 Pain in right knee: Secondary | ICD-10-CM | POA: Diagnosis not present

## 2024-05-07 DIAGNOSIS — G8918 Other acute postprocedural pain: Secondary | ICD-10-CM | POA: Diagnosis not present

## 2024-05-07 DIAGNOSIS — S83511A Sprain of anterior cruciate ligament of right knee, initial encounter: Secondary | ICD-10-CM | POA: Diagnosis not present

## 2024-05-12 DIAGNOSIS — S83511D Sprain of anterior cruciate ligament of right knee, subsequent encounter: Secondary | ICD-10-CM | POA: Diagnosis not present

## 2024-05-12 DIAGNOSIS — Z9889 Other specified postprocedural states: Secondary | ICD-10-CM | POA: Diagnosis not present

## 2024-05-12 DIAGNOSIS — M25561 Pain in right knee: Secondary | ICD-10-CM | POA: Diagnosis not present

## 2024-05-18 DIAGNOSIS — S83511D Sprain of anterior cruciate ligament of right knee, subsequent encounter: Secondary | ICD-10-CM | POA: Diagnosis not present

## 2024-05-18 DIAGNOSIS — Z9889 Other specified postprocedural states: Secondary | ICD-10-CM | POA: Diagnosis not present

## 2024-05-18 DIAGNOSIS — M25561 Pain in right knee: Secondary | ICD-10-CM | POA: Diagnosis not present

## 2024-05-20 DIAGNOSIS — Z9889 Other specified postprocedural states: Secondary | ICD-10-CM | POA: Diagnosis not present

## 2024-05-20 DIAGNOSIS — M25561 Pain in right knee: Secondary | ICD-10-CM | POA: Diagnosis not present

## 2024-05-20 DIAGNOSIS — S83511D Sprain of anterior cruciate ligament of right knee, subsequent encounter: Secondary | ICD-10-CM | POA: Diagnosis not present

## 2024-05-21 DIAGNOSIS — Z4889 Encounter for other specified surgical aftercare: Secondary | ICD-10-CM | POA: Diagnosis not present

## 2024-05-27 ENCOUNTER — Ambulatory Visit: Payer: BC Managed Care – PPO

## 2024-05-27 DIAGNOSIS — I442 Atrioventricular block, complete: Secondary | ICD-10-CM

## 2024-05-28 DIAGNOSIS — Z9889 Other specified postprocedural states: Secondary | ICD-10-CM | POA: Diagnosis not present

## 2024-05-28 DIAGNOSIS — M25561 Pain in right knee: Secondary | ICD-10-CM | POA: Diagnosis not present

## 2024-05-28 DIAGNOSIS — S83511D Sprain of anterior cruciate ligament of right knee, subsequent encounter: Secondary | ICD-10-CM | POA: Diagnosis not present

## 2024-05-31 DIAGNOSIS — S83511D Sprain of anterior cruciate ligament of right knee, subsequent encounter: Secondary | ICD-10-CM | POA: Diagnosis not present

## 2024-05-31 DIAGNOSIS — Z9889 Other specified postprocedural states: Secondary | ICD-10-CM | POA: Diagnosis not present

## 2024-05-31 DIAGNOSIS — M25561 Pain in right knee: Secondary | ICD-10-CM | POA: Diagnosis not present

## 2024-05-31 LAB — CUP PACEART REMOTE DEVICE CHECK
Battery Remaining Longevity: 118 mo
Battery Voltage: 3.09 V
Brady Statistic AP VP Percent: 1.78 %
Brady Statistic AP VS Percent: 47.16 %
Brady Statistic AS VP Percent: 0.29 %
Brady Statistic AS VS Percent: 50.77 %
Brady Statistic RA Percent Paced: 49.1 %
Brady Statistic RV Percent Paced: 2.07 %
Date Time Interrogation Session: 20250718164618
Implantable Lead Connection Status: 753985
Implantable Lead Connection Status: 753985
Implantable Lead Implant Date: 20160606
Implantable Lead Implant Date: 20160606
Implantable Lead Location: 753859
Implantable Lead Location: 753860
Implantable Lead Model: 3830
Implantable Lead Model: 5076
Implantable Pulse Generator Implant Date: 20241016
Lead Channel Impedance Value: 228 Ohm
Lead Channel Impedance Value: 323 Ohm
Lead Channel Impedance Value: 380 Ohm
Lead Channel Impedance Value: 456 Ohm
Lead Channel Pacing Threshold Amplitude: 1.625 V
Lead Channel Pacing Threshold Amplitude: 2 V
Lead Channel Pacing Threshold Pulse Width: 0.4 ms
Lead Channel Pacing Threshold Pulse Width: 0.4 ms
Lead Channel Sensing Intrinsic Amplitude: 1.75 mV
Lead Channel Sensing Intrinsic Amplitude: 1.75 mV
Lead Channel Sensing Intrinsic Amplitude: 2.375 mV
Lead Channel Sensing Intrinsic Amplitude: 2.375 mV
Lead Channel Setting Pacing Amplitude: 3.25 V
Lead Channel Setting Pacing Amplitude: 4 V
Lead Channel Setting Pacing Pulse Width: 0.4 ms
Lead Channel Setting Sensing Sensitivity: 0.9 mV
Zone Setting Status: 755011
Zone Setting Status: 755011

## 2024-06-01 ENCOUNTER — Ambulatory Visit: Payer: Self-pay | Admitting: Cardiology

## 2024-06-15 DIAGNOSIS — S83511D Sprain of anterior cruciate ligament of right knee, subsequent encounter: Secondary | ICD-10-CM | POA: Diagnosis not present

## 2024-06-15 DIAGNOSIS — Z9889 Other specified postprocedural states: Secondary | ICD-10-CM | POA: Diagnosis not present

## 2024-06-15 DIAGNOSIS — M25561 Pain in right knee: Secondary | ICD-10-CM | POA: Diagnosis not present

## 2024-06-21 DIAGNOSIS — M25561 Pain in right knee: Secondary | ICD-10-CM | POA: Diagnosis not present

## 2024-06-21 DIAGNOSIS — S83511D Sprain of anterior cruciate ligament of right knee, subsequent encounter: Secondary | ICD-10-CM | POA: Diagnosis not present

## 2024-06-21 DIAGNOSIS — Z9889 Other specified postprocedural states: Secondary | ICD-10-CM | POA: Diagnosis not present

## 2024-06-25 DIAGNOSIS — S83511D Sprain of anterior cruciate ligament of right knee, subsequent encounter: Secondary | ICD-10-CM | POA: Diagnosis not present

## 2024-06-25 DIAGNOSIS — Z9889 Other specified postprocedural states: Secondary | ICD-10-CM | POA: Diagnosis not present

## 2024-06-25 DIAGNOSIS — M25561 Pain in right knee: Secondary | ICD-10-CM | POA: Diagnosis not present

## 2024-07-05 DIAGNOSIS — Z9889 Other specified postprocedural states: Secondary | ICD-10-CM | POA: Diagnosis not present

## 2024-07-05 DIAGNOSIS — M25561 Pain in right knee: Secondary | ICD-10-CM | POA: Diagnosis not present

## 2024-07-05 DIAGNOSIS — S83511D Sprain of anterior cruciate ligament of right knee, subsequent encounter: Secondary | ICD-10-CM | POA: Diagnosis not present

## 2024-07-07 DIAGNOSIS — M25561 Pain in right knee: Secondary | ICD-10-CM | POA: Diagnosis not present

## 2024-07-07 DIAGNOSIS — S83511D Sprain of anterior cruciate ligament of right knee, subsequent encounter: Secondary | ICD-10-CM | POA: Diagnosis not present

## 2024-07-07 DIAGNOSIS — Z9889 Other specified postprocedural states: Secondary | ICD-10-CM | POA: Diagnosis not present

## 2024-07-13 DIAGNOSIS — M25561 Pain in right knee: Secondary | ICD-10-CM | POA: Diagnosis not present

## 2024-07-13 DIAGNOSIS — Z9889 Other specified postprocedural states: Secondary | ICD-10-CM | POA: Diagnosis not present

## 2024-07-13 DIAGNOSIS — S83511D Sprain of anterior cruciate ligament of right knee, subsequent encounter: Secondary | ICD-10-CM | POA: Diagnosis not present

## 2024-08-02 DIAGNOSIS — S83511D Sprain of anterior cruciate ligament of right knee, subsequent encounter: Secondary | ICD-10-CM | POA: Diagnosis not present

## 2024-08-02 DIAGNOSIS — Z9889 Other specified postprocedural states: Secondary | ICD-10-CM | POA: Diagnosis not present

## 2024-08-02 DIAGNOSIS — M25561 Pain in right knee: Secondary | ICD-10-CM | POA: Diagnosis not present

## 2024-08-17 NOTE — Progress Notes (Signed)
 Remote PPM Transmission

## 2024-09-02 ENCOUNTER — Ambulatory Visit: Attending: Cardiology

## 2024-09-02 DIAGNOSIS — I442 Atrioventricular block, complete: Secondary | ICD-10-CM | POA: Diagnosis not present

## 2024-09-03 LAB — CUP PACEART REMOTE DEVICE CHECK
Battery Remaining Longevity: 122 mo
Battery Voltage: 3.05 V
Brady Statistic AP VP Percent: 1.09 %
Brady Statistic AP VS Percent: 43.06 %
Brady Statistic AS VP Percent: 0.25 %
Brady Statistic AS VS Percent: 55.61 %
Brady Statistic RA Percent Paced: 44.35 %
Brady Statistic RV Percent Paced: 1.34 %
Date Time Interrogation Session: 20251023081841
Implantable Lead Connection Status: 753985
Implantable Lead Connection Status: 753985
Implantable Lead Implant Date: 20160606
Implantable Lead Implant Date: 20160606
Implantable Lead Location: 753859
Implantable Lead Location: 753860
Implantable Lead Model: 3830
Implantable Lead Model: 5076
Implantable Pulse Generator Implant Date: 20241016
Lead Channel Impedance Value: 228 Ohm
Lead Channel Impedance Value: 323 Ohm
Lead Channel Impedance Value: 380 Ohm
Lead Channel Impedance Value: 456 Ohm
Lead Channel Pacing Threshold Amplitude: 1.5 V
Lead Channel Pacing Threshold Amplitude: 1.875 V
Lead Channel Pacing Threshold Pulse Width: 0.4 ms
Lead Channel Pacing Threshold Pulse Width: 0.4 ms
Lead Channel Sensing Intrinsic Amplitude: 1.625 mV
Lead Channel Sensing Intrinsic Amplitude: 1.625 mV
Lead Channel Sensing Intrinsic Amplitude: 2.5 mV
Lead Channel Sensing Intrinsic Amplitude: 2.5 mV
Lead Channel Setting Pacing Amplitude: 3 V
Lead Channel Setting Pacing Amplitude: 3.75 V
Lead Channel Setting Pacing Pulse Width: 0.4 ms
Lead Channel Setting Sensing Sensitivity: 0.9 mV
Zone Setting Status: 755011
Zone Setting Status: 755011

## 2024-09-06 ENCOUNTER — Ambulatory Visit: Payer: Self-pay | Admitting: Cardiology

## 2024-09-06 NOTE — Progress Notes (Signed)
 Remote PPM Transmission

## 2024-09-10 DIAGNOSIS — M1711 Unilateral primary osteoarthritis, right knee: Secondary | ICD-10-CM | POA: Diagnosis not present

## 2024-12-02 ENCOUNTER — Ambulatory Visit: Attending: Cardiology

## 2024-12-02 DIAGNOSIS — I442 Atrioventricular block, complete: Secondary | ICD-10-CM

## 2024-12-06 LAB — CUP PACEART REMOTE DEVICE CHECK
Battery Remaining Longevity: 110 mo
Battery Voltage: 3.03 V
Brady Statistic AP VP Percent: 0.88 %
Brady Statistic AP VS Percent: 34 %
Brady Statistic AS VP Percent: 0.18 %
Brady Statistic AS VS Percent: 64.94 %
Brady Statistic RA Percent Paced: 35.28 %
Brady Statistic RV Percent Paced: 1.06 %
Date Time Interrogation Session: 20260124115927
Implantable Lead Connection Status: 753985
Implantable Lead Connection Status: 753985
Implantable Lead Implant Date: 20160606
Implantable Lead Implant Date: 20160606
Implantable Lead Location: 753859
Implantable Lead Location: 753860
Implantable Lead Model: 3830
Implantable Lead Model: 5076
Implantable Pulse Generator Implant Date: 20241016
Lead Channel Impedance Value: 285 Ohm
Lead Channel Impedance Value: 323 Ohm
Lead Channel Impedance Value: 418 Ohm
Lead Channel Impedance Value: 475 Ohm
Lead Channel Pacing Threshold Amplitude: 1.625 V
Lead Channel Pacing Threshold Amplitude: 2.125 V
Lead Channel Pacing Threshold Pulse Width: 0.4 ms
Lead Channel Pacing Threshold Pulse Width: 0.4 ms
Lead Channel Sensing Intrinsic Amplitude: 2 mV
Lead Channel Sensing Intrinsic Amplitude: 2 mV
Lead Channel Sensing Intrinsic Amplitude: 2.5 mV
Lead Channel Sensing Intrinsic Amplitude: 2.5 mV
Lead Channel Setting Pacing Amplitude: 3.25 V
Lead Channel Setting Pacing Amplitude: 4.25 V
Lead Channel Setting Pacing Pulse Width: 0.4 ms
Lead Channel Setting Sensing Sensitivity: 0.9 mV
Zone Setting Status: 755011
Zone Setting Status: 755011

## 2024-12-06 NOTE — Progress Notes (Signed)
 Remote PPM Transmission

## 2024-12-07 ENCOUNTER — Ambulatory Visit: Payer: Self-pay | Admitting: Cardiology

## 2025-03-03 ENCOUNTER — Ambulatory Visit

## 2025-06-02 ENCOUNTER — Ambulatory Visit

## 2025-09-01 ENCOUNTER — Ambulatory Visit

## 2025-12-01 ENCOUNTER — Ambulatory Visit
